# Patient Record
Sex: Female | Born: 1970
Health system: Southern US, Community
[De-identification: ages and names within clinical notes are randomized; demographics above are authoritative.]

## PROBLEM LIST (undated history)

## (undated) DIAGNOSIS — D219 Benign neoplasm of connective and other soft tissue, unspecified: Secondary | ICD-10-CM

## (undated) DIAGNOSIS — J189 Pneumonia, unspecified organism: Secondary | ICD-10-CM

## (undated) DIAGNOSIS — G709 Myoneural disorder, unspecified: Secondary | ICD-10-CM

## (undated) DIAGNOSIS — I739 Peripheral vascular disease, unspecified: Secondary | ICD-10-CM

## (undated) DIAGNOSIS — R42 Dizziness and giddiness: Secondary | ICD-10-CM

## (undated) DIAGNOSIS — D649 Anemia, unspecified: Secondary | ICD-10-CM

## (undated) DIAGNOSIS — T2230XA Burn of third degree of shoulder and upper limb, except wrist and hand, unspecified site, initial encounter: Secondary | ICD-10-CM

## (undated) DIAGNOSIS — D759 Disease of blood and blood-forming organs, unspecified: Secondary | ICD-10-CM

## (undated) DIAGNOSIS — K219 Gastro-esophageal reflux disease without esophagitis: Secondary | ICD-10-CM

## (undated) DIAGNOSIS — M1990 Unspecified osteoarthritis, unspecified site: Secondary | ICD-10-CM

## (undated) DIAGNOSIS — I10 Essential (primary) hypertension: Secondary | ICD-10-CM

## (undated) HISTORY — DX: Myoneural disorder, unspecified: G70.9

## (undated) HISTORY — PX: MYRINGOTOMY WITH TUBE PLACEMENT: SHX5663

## (undated) HISTORY — PX: WISDOM TOOTH EXTRACTION: SHX21

## (undated) HISTORY — DX: Burn of third degree of shoulder and upper limb, except wrist and hand, unspecified site, initial encounter: T22.30XA

## (undated) HISTORY — PX: FOOT SURGERY: SHX648

## (undated) HISTORY — PX: SKIN GRAFT: SHX250

## (undated) HISTORY — DX: Gastro-esophageal reflux disease without esophagitis: K21.9

## (undated) HISTORY — PX: BREAST EXCISIONAL BIOPSY: SUR124

---

## 2011-01-12 ENCOUNTER — Other Ambulatory Visit (HOSPITAL_COMMUNITY)
Admission: RE | Admit: 2011-01-12 | Discharge: 2011-01-12 | Disposition: A | Discharge status: Home / Self Care | Payer: Self-pay | Source: Ambulatory Visit | Attending: Family Medicine | Admitting: Family Medicine

## 2011-01-12 DIAGNOSIS — Z124 Encounter for screening for malignant neoplasm of cervix: Secondary | ICD-10-CM | POA: Insufficient documentation

## 2011-01-12 DIAGNOSIS — R87810 Cervical high risk human papillomavirus (HPV) DNA test positive: Secondary | ICD-10-CM | POA: Insufficient documentation

## 2011-12-08 ENCOUNTER — Other Ambulatory Visit (HOSPITAL_COMMUNITY)
Admission: RE | Admit: 2011-12-08 | Discharge: 2011-12-08 | Disposition: A | Discharge status: Home / Self Care | Payer: Medicaid Other | Source: Ambulatory Visit | Attending: Family Medicine | Admitting: Family Medicine

## 2011-12-08 ENCOUNTER — Other Ambulatory Visit (HOSPITAL_COMMUNITY): Payer: Self-pay | Admitting: Family Medicine

## 2011-12-08 DIAGNOSIS — R8789 Other abnormal findings in specimens from female genital organs: Secondary | ICD-10-CM | POA: Insufficient documentation

## 2011-12-08 DIAGNOSIS — D219 Benign neoplasm of connective and other soft tissue, unspecified: Secondary | ICD-10-CM

## 2011-12-11 ENCOUNTER — Ambulatory Visit (HOSPITAL_COMMUNITY)
Admission: RE | Admit: 2011-12-11 | Discharge: 2011-12-11 | Disposition: A | Discharge status: Home / Self Care | Payer: Medicaid Other | Source: Ambulatory Visit | Attending: Family Medicine | Admitting: Family Medicine

## 2011-12-11 DIAGNOSIS — D259 Leiomyoma of uterus, unspecified: Secondary | ICD-10-CM | POA: Insufficient documentation

## 2011-12-11 DIAGNOSIS — N949 Unspecified condition associated with female genital organs and menstrual cycle: Secondary | ICD-10-CM | POA: Insufficient documentation

## 2011-12-11 DIAGNOSIS — D219 Benign neoplasm of connective and other soft tissue, unspecified: Secondary | ICD-10-CM

## 2012-06-08 ENCOUNTER — Other Ambulatory Visit (HOSPITAL_COMMUNITY)
Admission: RE | Admit: 2012-06-08 | Discharge: 2012-06-08 | Disposition: A | Discharge status: Home / Self Care | Payer: Medicaid Other | Source: Ambulatory Visit | Attending: Family Medicine | Admitting: Family Medicine

## 2012-06-08 ENCOUNTER — Other Ambulatory Visit: Payer: Self-pay | Admitting: Family Medicine

## 2012-06-08 DIAGNOSIS — R87810 Cervical high risk human papillomavirus (HPV) DNA test positive: Secondary | ICD-10-CM | POA: Insufficient documentation

## 2012-06-08 DIAGNOSIS — Z113 Encounter for screening for infections with a predominantly sexual mode of transmission: Secondary | ICD-10-CM | POA: Insufficient documentation

## 2012-06-08 DIAGNOSIS — Z1151 Encounter for screening for human papillomavirus (HPV): Secondary | ICD-10-CM | POA: Insufficient documentation

## 2012-06-08 DIAGNOSIS — R8789 Other abnormal findings in specimens from female genital organs: Secondary | ICD-10-CM | POA: Insufficient documentation

## 2013-01-17 ENCOUNTER — Encounter (HOSPITAL_BASED_OUTPATIENT_CLINIC_OR_DEPARTMENT_OTHER): Payer: Self-pay | Admitting: Emergency Medicine

## 2013-01-17 ENCOUNTER — Emergency Department (HOSPITAL_BASED_OUTPATIENT_CLINIC_OR_DEPARTMENT_OTHER)
Admission: EM | Admit: 2013-01-17 | Discharge: 2013-01-17 | Disposition: A | Discharge status: Home / Self Care | Payer: Self-pay | Attending: Emergency Medicine | Admitting: Emergency Medicine

## 2013-01-17 DIAGNOSIS — R350 Frequency of micturition: Secondary | ICD-10-CM | POA: Insufficient documentation

## 2013-01-17 DIAGNOSIS — Z8742 Personal history of other diseases of the female genital tract: Secondary | ICD-10-CM | POA: Insufficient documentation

## 2013-01-17 DIAGNOSIS — Z3202 Encounter for pregnancy test, result negative: Secondary | ICD-10-CM | POA: Insufficient documentation

## 2013-01-17 DIAGNOSIS — N390 Urinary tract infection, site not specified: Secondary | ICD-10-CM | POA: Insufficient documentation

## 2013-01-17 HISTORY — DX: Benign neoplasm of connective and other soft tissue, unspecified: D21.9

## 2013-01-17 LAB — URINALYSIS, ROUTINE W REFLEX MICROSCOPIC
Glucose, UA: NEGATIVE mg/dL
pH: 7.5 (ref 5.0–8.0)

## 2013-01-17 MED ORDER — PHENAZOPYRIDINE HCL 100 MG PO TABS
95.0000 mg | ORAL_TABLET | Freq: Once | ORAL | Status: AC
  Administered 2013-01-17: 100 mg via ORAL
  Filled 2013-01-17: qty 1

## 2013-01-17 MED ORDER — NITROFURANTOIN MONOHYD MACRO 100 MG PO CAPS
100.0000 mg | ORAL_CAPSULE | Freq: Once | ORAL | Status: AC
  Administered 2013-01-17: 100 mg via ORAL
  Filled 2013-01-17: qty 1

## 2013-01-17 MED ORDER — NITROFURANTOIN MONOHYD MACRO 100 MG PO CAPS: 100.0000 mg | ORAL_CAPSULE | Freq: Two times a day (BID) | ORAL | Status: DC

## 2013-01-17 MED ORDER — HYDROCODONE-ACETAMINOPHEN 5-325 MG PO TABS: 1.0000 | ORAL_TABLET | Freq: Four times a day (QID) | ORAL | Status: DC | PRN

## 2013-01-17 MED ORDER — PHENAZOPYRIDINE HCL 200 MG PO TABS: 200.0000 mg | ORAL_TABLET | Freq: Three times a day (TID) | ORAL | Status: DC

## 2013-01-17 NOTE — Discharge Instructions (Signed)
Urinary Tract Infection  Urinary tract infections (UTIs) can develop anywhere along your urinary tract. Your urinary tract is your body's drainage system for removing wastes and extra water. Your urinary tract includes two kidneys, two ureters, a bladder, and a urethra. Your kidneys are a pair of bean-shaped organs. Each kidney is about the size of your fist. They are located below your ribs, one on each side of your spine.  CAUSES  Infections are caused by microbes, which are microscopic organisms, including fungi, viruses, and bacteria. These organisms are so small that they can only be seen through a microscope. Bacteria are the microbes that most commonly cause UTIs.  SYMPTOMS   Symptoms of UTIs may vary by age and gender of the patient and by the location of the infection. Symptoms in young women typically include a frequent and intense urge to urinate and a painful, burning feeling in the bladder or urethra during urination. Older women and men are more likely to be tired, shaky, and weak and have muscle aches and abdominal pain. A fever may mean the infection is in your kidneys. Other symptoms of a kidney infection include pain in your back or sides below the ribs, nausea, and vomiting.  DIAGNOSIS  To diagnose a UTI, your caregiver will ask you about your symptoms. Your caregiver also will ask to provide a urine sample. The urine sample will be tested for bacteria and white blood cells. White blood cells are made by your body to help fight infection.  TREATMENT   Typically, UTIs can be treated with medication. Because most UTIs are caused by a bacterial infection, they usually can be treated with the use of antibiotics. The choice of antibiotic and length of treatment depend on your symptoms and the type of bacteria causing your infection.  HOME CARE INSTRUCTIONS   If you were prescribed antibiotics, take them exactly as your caregiver instructs you. Finish the medication even if you feel better after you  have only taken some of the medication.   Drink enough water and fluids to keep your urine clear or pale yellow.   Avoid caffeine, tea, and carbonated beverages. They tend to irritate your bladder.   Empty your bladder often. Avoid holding urine for long periods of time.   Empty your bladder before and after sexual intercourse.   After a bowel movement, women should cleanse from front to back. Use each tissue only once.  SEEK MEDICAL CARE IF:    You have back pain.   You develop a fever.   Your symptoms do not begin to resolve within 3 days.  SEEK IMMEDIATE MEDICAL CARE IF:    You have severe back pain or lower abdominal pain.   You develop chills.   You have nausea or vomiting.   You have continued burning or discomfort with urination.  MAKE SURE YOU:    Understand these instructions.   Will watch your condition.   Will get help right away if you are not doing well or get worse.  Document Released: 03/18/2005 Document Revised: 12/08/2011 Document Reviewed: 07/17/2011  [employer] Patient Information 2014 [employer].

## 2013-01-17 NOTE — ED Provider Notes (Signed)
CSN: [id number]     Arrival date & time 01/17/13  2052 History     First MD Initiated Contact with Patient 01/17/13 2302     Chief Complaint  Patient presents with  . Abdominal Pain   (Consider location/radiation/quality/duration/timing/severity/associated sxs/prior Treatment) Patient is a 42 y.o. female presenting with abdominal pain. The history is provided by the patient.  Abdominal Pain This is a new problem. The current episode started 3 to 5 hours ago. The problem occurs constantly. The problem has not changed since onset.Associated symptoms include abdominal pain. Pertinent negatives include no chest pain, no headaches and no shortness of breath. Nothing aggravates the symptoms. Nothing relieves the symptoms. She has tried nothing for the symptoms. The treatment provided no relief.  Urinary frequency.  No n/v/d.  No fevers  Past Medical History  Diagnosis Date  . Fibroid tumor    Past Surgical History  Procedure Laterality Date  . Wisdom tooth extraction    . Breast lumpectomy    . Foot surgery    . Skin graft    . Myringotomy with tube placement     History reviewed. No pertinent family history. History  Substance Use Topics  . Smoking status: Never Smoker   . Smokeless tobacco: Never Used  . Alcohol Use: Yes     Comment: ocassionally   OB History   Grav Para Term Preterm Abortions TAB SAB Ect Mult Living                 Review of Systems  Respiratory: Negative for shortness of breath.   Cardiovascular: Negative for chest pain.  Gastrointestinal: Positive for abdominal pain.  Genitourinary: Positive for frequency.  Neurological: Negative for headaches.  All other systems reviewed and are negative.    Allergies  Dilaudid  Home Medications   Current Outpatient Rx  Name  Route  Sig  Dispense  Refill  . ibuprofen (ADVIL,MOTRIN) 200 MG tablet   Oral   Take 200 mg by mouth every 6 (six) hours as needed for pain.          BP 128/73  Pulse 80  Resp  18  Ht 5\' 3"  (1.6 m)  Wt 141 lb (63.957 kg)  BMI 24.98 kg/m2  SpO2 100%  LMP 01/03/2013 Physical Exam  Constitutional: She is oriented to person, place, and time. She appears well-developed and well-nourished. No distress.  HENT:  Head: Normocephalic and atraumatic.  Mouth/Throat: Oropharynx is clear and moist.  Eyes: Conjunctivae are normal. Pupils are equal, round, and reactive to light.  Neck: Normal range of motion. Neck supple.  Cardiovascular: Normal rate, regular rhythm, normal heart sounds and intact distal pulses.   Pulmonary/Chest: Effort normal and breath sounds normal. She has no wheezes. She has no rales.  Abdominal: Soft. Bowel sounds are normal. She exhibits no distension and no mass. There is no tenderness. There is no rebound and no guarding.  Musculoskeletal: Normal range of motion.  Neurological: She is alert and oriented to person, place, and time.  Skin: Skin is warm and dry.  Psychiatric: She has a normal mood and affect.    ED Course   Procedures (including critical care time)  Labs Reviewed  URINALYSIS, ROUTINE W REFLEX MICROSCOPIC - Abnormal; Notable for the following:    Leukocytes, UA SMALL (*)    All other components within normal limits  PREGNANCY, URINE  URINE MICROSCOPIC-ADD ON   No results found. No diagnosis found.  MDM  Exam and vitals benign no indication  for imaging at this time.  If symptoms persist return.  Follow up with your family doctor in 2 days for recheck  Kashena Novitski K Bryttney Netzer-Rasch, MD 01/17/13 2309

## 2013-01-17 NOTE — ED Notes (Signed)
Pt reports lower back pain that radiates to front of abdomen

## 2013-01-18 ENCOUNTER — Telehealth (HOSPITAL_COMMUNITY): Payer: Self-pay | Admitting: Emergency Medicine

## 2013-01-18 NOTE — ED Notes (Signed)
Call from pharmacy Rx for Macrobid too expensive.  Chart reviewed by Everlean Patterson PA "Keflex 500 mg po QID x 10 days #40"  New Rx called to pharmacy.

## 2013-05-09 ENCOUNTER — Emergency Department (HOSPITAL_BASED_OUTPATIENT_CLINIC_OR_DEPARTMENT_OTHER)
Admission: EM | Admit: 2013-05-09 | Discharge: 2013-05-09 | Disposition: A | Discharge status: Home / Self Care | Payer: Medicaid Other | Attending: Emergency Medicine | Admitting: Emergency Medicine

## 2013-05-09 ENCOUNTER — Encounter (HOSPITAL_BASED_OUTPATIENT_CLINIC_OR_DEPARTMENT_OTHER): Payer: Self-pay | Admitting: Emergency Medicine

## 2013-05-09 DIAGNOSIS — L0201 Cutaneous abscess of face: Secondary | ICD-10-CM | POA: Insufficient documentation

## 2013-05-09 DIAGNOSIS — L03211 Cellulitis of face: Secondary | ICD-10-CM | POA: Insufficient documentation

## 2013-05-09 DIAGNOSIS — Z79899 Other long term (current) drug therapy: Secondary | ICD-10-CM | POA: Insufficient documentation

## 2013-05-09 DIAGNOSIS — Z8742 Personal history of other diseases of the female genital tract: Secondary | ICD-10-CM | POA: Insufficient documentation

## 2013-05-09 DIAGNOSIS — J340 Abscess, furuncle and carbuncle of nose: Secondary | ICD-10-CM

## 2013-05-09 MED ORDER — DOXYCYCLINE HYCLATE 100 MG PO CAPS: 100.0000 mg | ORAL_CAPSULE | Freq: Two times a day (BID) | ORAL | Status: DC

## 2013-05-09 MED ORDER — LIDOCAINE HCL (PF) 1 % IJ SOLN
INTRAMUSCULAR | Status: AC
  Filled 2013-05-09: qty 5

## 2013-05-09 NOTE — ED Notes (Signed)
Patient states she has had sinus congestion for one week.  Several days ago she noticed that she has swelling in the left side of her nose and has noticed a swelling on the inside of her nose.  Symptoms are associated with productive cough with clear to yellow to bloody secretions.

## 2013-05-09 NOTE — ED Provider Notes (Signed)
CSN: [id number]     Arrival date & time 05/09/13  8295 History   First MD Initiated Contact with Patient 05/09/13 1028     Chief Complaint  Patient presents with  . URI   (Consider location/radiation/quality/duration/timing/severity/associated sxs/prior Treatment) Patient is a 42 y.o. female presenting with URI.  URI  Pt with no significant past medical history reports several days of sinus congestion and nasal drainage. For 2 days she has had increasing pain and swelling in her L nare with small amount of bleeding. No fever, no cough or sore throat. Some post-nasal drip.  Past Medical History  Diagnosis Date  . Fibroid tumor    Past Surgical History  Procedure Laterality Date  . Wisdom tooth extraction    . Breast lumpectomy    . Foot surgery    . Skin graft    . Myringotomy with tube placement     No family history on file. History  Substance Use Topics  . Smoking status: Never Smoker   . Smokeless tobacco: Never Used  . Alcohol Use: Yes     Comment: ocassionally   OB History   Grav Para Term Preterm Abortions TAB SAB Ect Mult Living                 Review of Systems All other systems reviewed and are negative except as noted in HPI.    Allergies  Dilaudid  Home Medications   Current Outpatient Rx  Name  Route  Sig  Dispense  Refill  . HYDROcodone-acetaminophen (NORCO) 5-325 MG per tablet   Oral   Take 1 tablet by mouth every 6 (six) hours as needed for pain.   10 tablet   0   . ibuprofen (ADVIL,MOTRIN) 200 MG tablet   Oral   Take 200 mg by mouth every 6 (six) hours as needed for pain.         . nitrofurantoin, macrocrystal-monohydrate, (MACROBID) 100 MG capsule   Oral   Take 1 capsule (100 mg total) by mouth 2 (two) times daily. X 7 days   14 capsule   0   . phenazopyridine (PYRIDIUM) 200 MG tablet   Oral   Take 1 tablet (200 mg total) by mouth 3 (three) times daily.   6 tablet   0    BP 111/66  Pulse 77  Temp(Src) 98.5 F (36.9 C)  (Oral)  Resp 16  Ht 5\' 3"  (1.6 m)  Wt 135 lb 6 oz (61.406 kg)  BMI 23.99 kg/m2  SpO2 99%  LMP 05/03/2013 Physical Exam  Nursing note and vitals reviewed. Constitutional: She is oriented to person, place, and time. She appears well-developed and well-nourished.  HENT:  Head: Normocephalic and atraumatic.  There is mucosal edema on the R nare and a 1cm x 1cm area of swelling and fluctuance inside the L nare, originating laterally and draining small amount of pus  Eyes: EOM are normal. Pupils are equal, round, and reactive to light.  Neck: Normal range of motion. Neck supple.  Cardiovascular: Normal rate, normal heart sounds and intact distal pulses.   Pulmonary/Chest: Effort normal and breath sounds normal.  Abdominal: Bowel sounds are normal. She exhibits no distension. There is no tenderness.  Musculoskeletal: Normal range of motion. She exhibits no edema and no tenderness.  Neurological: She is alert and oriented to person, place, and time. She has normal strength. No cranial nerve deficit or sensory deficit.  Skin: Skin is warm and dry. No rash noted.  Psychiatric:  She has a normal mood and affect.    ED Course  Procedures (including critical care time)  INCISION AND DRAINAGE Performed by: Pollyann Savoy. Consent: Verbal consent obtained. Risks and benefits: risks, benefits and alternatives were discussed Time out performed prior to procedure Type: abscess Body area: intranasal Anesthesia: local infiltration Incision was made with a scalpel. Local anesthetic: lidocaine 1% no epinephrine Anesthetic total: 1 ml Complexity: simple Drainage: purulent Drainage amount: small Packing material: none Patient tolerance: Patient tolerated the procedure well with no immediate complications.     Labs Review Labs Reviewed - No data to display Imaging Review No results found.  EKG Interpretation   None       MDM   1. Abscess of nose     Pt with small intranasal  abscess, suspect CA-MRSA. Will give topical Abx ointment and oral doxy with ENT followup for resolution.     Charles B. Bernette Mayers, MD 05/09/13 1256

## 2013-05-09 NOTE — ED Notes (Signed)
MD at bedside for I and D

## 2014-04-05 ENCOUNTER — Emergency Department (HOSPITAL_BASED_OUTPATIENT_CLINIC_OR_DEPARTMENT_OTHER): Payer: Medicaid Other

## 2014-04-05 ENCOUNTER — Encounter (HOSPITAL_BASED_OUTPATIENT_CLINIC_OR_DEPARTMENT_OTHER): Payer: Self-pay | Admitting: Emergency Medicine

## 2014-04-05 ENCOUNTER — Emergency Department (HOSPITAL_BASED_OUTPATIENT_CLINIC_OR_DEPARTMENT_OTHER)
Admission: EM | Admit: 2014-04-05 | Discharge: 2014-04-06 | Disposition: A | Discharge status: Home / Self Care | Payer: Medicaid Other | Attending: Emergency Medicine | Admitting: Emergency Medicine

## 2014-04-05 DIAGNOSIS — Z87448 Personal history of other diseases of urinary system: Secondary | ICD-10-CM | POA: Insufficient documentation

## 2014-04-05 DIAGNOSIS — R42 Dizziness and giddiness: Secondary | ICD-10-CM | POA: Insufficient documentation

## 2014-04-05 DIAGNOSIS — Z792 Long term (current) use of antibiotics: Secondary | ICD-10-CM | POA: Insufficient documentation

## 2014-04-05 DIAGNOSIS — Z79899 Other long term (current) drug therapy: Secondary | ICD-10-CM | POA: Insufficient documentation

## 2014-04-05 DIAGNOSIS — Z791 Long term (current) use of non-steroidal anti-inflammatories (NSAID): Secondary | ICD-10-CM | POA: Insufficient documentation

## 2014-04-05 LAB — CBC WITH DIFFERENTIAL/PLATELET
BASOS ABS: 0 10*3/uL (ref 0.0–0.1)
Basophils Relative: 0 % (ref 0–1)
EOS PCT: 2 % (ref 0–5)
Eosinophils Absolute: 0.1 10*3/uL (ref 0.0–0.7)
HCT: 31.4 % — ABNORMAL LOW (ref 36.0–46.0)
Hemoglobin: 10.3 g/dL — ABNORMAL LOW (ref 12.0–15.0)
LYMPHS PCT: 36 % (ref 12–46)
Lymphs Abs: 2 10*3/uL (ref 0.7–4.0)
MCH: 28.5 pg (ref 26.0–34.0)
MCHC: 32.8 g/dL (ref 30.0–36.0)
MCV: 86.7 fL (ref 78.0–100.0)
Monocytes Absolute: 0.5 10*3/uL (ref 0.1–1.0)
Monocytes Relative: 9 % (ref 3–12)
NEUTROS ABS: 2.8 10*3/uL (ref 1.7–7.7)
Neutrophils Relative %: 53 % (ref 43–77)
PLATELETS: 225 10*3/uL (ref 150–400)
RBC: 3.62 MIL/uL — ABNORMAL LOW (ref 3.87–5.11)
RDW: 13.7 % (ref 11.5–15.5)
WBC: 5.4 10*3/uL (ref 4.0–10.5)

## 2014-04-05 MED ORDER — MECLIZINE HCL 25 MG PO TABS
25.0000 mg | ORAL_TABLET | Freq: Once | ORAL | Status: AC
  Administered 2014-04-05: 25 mg via ORAL
  Filled 2014-04-05: qty 1

## 2014-04-05 NOTE — ED Provider Notes (Signed)
CSN: [id number]     Arrival date & time 04/05/14  2120 History  This chart was scribed for Gail Speak, MD by Delphia Grates, ED Scribe. This patient was seen in room MH11/MH11 and the patient's care was started at 11:16 PM.     Chief Complaint  Patient presents with  . Dizziness    The history is provided by the patient. No language interpreter was used.    HPI Comments: Gail Moreno is a 43 y.o. female who presents to the Emergency Department complaining of gradually worsening dizziness onset today. Patient describes the dizziness as a room spinning sensation persists even while eyes are closed. Patient reports history of same over the past 4 years and was evaluated, however, she reports she was asymptomatic during her evaluation. There is associated bilateral hearing loss and occasional numbness in the right hand. Patient reports she the dizziness is still present while her eyes are closed. She denies any aggravating factors, and states she currently not experiencing any dizziness at present.  She denies HA, blurred vision, tinnitus, or neck pain.   Past Medical History  Diagnosis Date  . Fibroid tumor    Past Surgical History  Procedure Laterality Date  . Wisdom tooth extraction    . Breast lumpectomy    . Foot surgery    . Skin graft    . Myringotomy with tube placement     No family history on file. History  Substance Use Topics  . Smoking status: Never Smoker   . Smokeless tobacco: Never Used  . Alcohol Use: Yes     Comment: ocassionally   OB History   Grav Para Term Preterm Abortions TAB SAB Ect Mult Living                 Review of Systems A complete 10 system review of systems was obtained and all systems are negative except as noted in the HPI and PMH.     Allergies  Dilaudid  Home Medications   Prior to Admission medications   Medication Sig Start Date End Date Taking? Authorizing Provider  doxycycline (VIBRAMYCIN) 100 MG capsule Take 1 capsule  (100 mg total) by mouth 2 (two) times daily. 05/09/13   Charles B. Karle Starch, MD  HYDROcodone-acetaminophen (NORCO) 5-325 MG per tablet Take 1 tablet by mouth every 6 (six) hours as needed for pain. 01/17/13   April K Palumbo-Rasch, MD  ibuprofen (ADVIL,MOTRIN) 200 MG tablet Take 200 mg by mouth every 6 (six) hours as needed for pain.    Historical Provider, MD  nitrofurantoin, macrocrystal-monohydrate, (MACROBID) 100 MG capsule Take 1 capsule (100 mg total) by mouth 2 (two) times daily. X 7 days 01/17/13   April K Palumbo-Rasch, MD  phenazopyridine (PYRIDIUM) 200 MG tablet Take 1 tablet (200 mg total) by mouth 3 (three) times daily. 01/17/13   April K Palumbo-Rasch, MD   BP 107/71  Pulse 70  Temp(Src) 98.4 F (36.9 C) (Oral)  Resp 16  Ht 5\' 3"  (1.6 m)  Wt 145 lb (65.772 kg)  BMI 25.69 kg/m2  SpO2 100%  LMP 03/17/2014  Physical Exam  Nursing note and vitals reviewed. Constitutional: She is oriented to person, place, and time. She appears well-developed and well-nourished. No distress.  HENT:  Head: Normocephalic and atraumatic.  Mouth/Throat: Oropharynx is clear and moist.  TMs are clear bilaterally.  Eyes: Conjunctivae and EOM are normal. Pupils are equal, round, and reactive to light.  Neck: Neck supple. No tracheal deviation present.  Cardiovascular:  Normal rate, regular rhythm and normal heart sounds.   No murmur heard. Pulmonary/Chest: Effort normal and breath sounds normal. No respiratory distress.  Musculoskeletal: Normal range of motion.  Neurological: She is alert and oriented to person, place, and time. No cranial nerve deficit. She exhibits normal muscle tone. Coordination normal.  Skin: Skin is warm and dry.  Psychiatric: She has a normal mood and affect. Her behavior is normal.    ED Course  Procedures (including critical care time)  DIAGNOSTIC STUDIES: Oxygen Saturation is 100% on room air, normal by my interpretation.    COORDINATION OF CARE: At 2322 Discussed  treatment plan with patient which includes Antivert, labs, and CT scan. Patient agrees.   Labs Review Labs Reviewed  CBC WITH DIFFERENTIAL - Abnormal; Notable for the following:    RBC 3.62 (*)    Hemoglobin 10.3 (*)    HCT 31.4 (*)    All other components within normal limits  BASIC METABOLIC PANEL    Imaging Review Ct Head Wo Contrast  04/05/2014   CLINICAL DATA:  Dizziness and right arm/hand numbness. Initial encounter.  EXAM: CT HEAD WITHOUT CONTRAST  TECHNIQUE: Contiguous axial images were obtained from the base of the skull through the vertex without intravenous contrast.  COMPARISON:  None.  FINDINGS: Skull and Sinuses:Negative for fracture or destructive process. The mastoids, middle ears, and imaged paranasal sinuses are clear.  Orbits: No acute abnormality.  Brain: No evidence of acute abnormality, such as acute infarction, hemorrhage, hydrocephalus, or mass lesion/mass effect. Apparent low-attenuation along the left occipital cortex is most consistent with artifact related to concavity of the calvarium.  IMPRESSION: Negative head CT.   Electronically Signed   By: Jorje Guild M.D.   On: 04/05/2014 23:47     EKG Interpretation None      MDM   Final diagnoses:  None    Patient presents with dizziness that sounds like peripheral vertigo. Her physical examination is unremarkable and neuro exam is nonfocal. Workup reveals a mild anemia with a hemoglobin of 10.3, however no other abnormalities in the electrolytes and head CT is normal. She will be treated with meclizine and advised to followup with her primary Dr.  I personally performed the services described in this documentation, which was scribed in my presence. The recorded information has been reviewed and is accurate.      Gail Speak, MD 04/06/14 [phone]

## 2014-04-05 NOTE — ED Notes (Signed)
C/o dizziness and hearing loss since 2011-states she came to ED tonight due to increase dizziness-denies injury-steady gait into triage

## 2014-04-06 LAB — BASIC METABOLIC PANEL
ANION GAP: 11 (ref 5–15)
BUN: 16 mg/dL (ref 6–23)
CALCIUM: 9.7 mg/dL (ref 8.4–10.5)
CO2: 27 meq/L (ref 19–32)
Chloride: 103 mEq/L (ref 96–112)
Creatinine, Ser: 0.8 mg/dL (ref 0.50–1.10)
GFR calc Af Amer: >90 mL/min (ref >90)
GFR calc non Af Amer: 90 mL/min — ABNORMAL LOW (ref >90)
Glucose, Bld: 99 mg/dL (ref 70–99)
Potassium: 4.1 mEq/L (ref 3.7–5.3)
SODIUM: 141 meq/L (ref 137–147)

## 2014-04-06 MED ORDER — MECLIZINE HCL 25 MG PO TABS: 25.0000 mg | ORAL_TABLET | Freq: Three times a day (TID) | ORAL | Status: DC | PRN

## 2014-04-06 NOTE — ED Notes (Signed)
MD at bedside discussing test results and dispo plan of care. 

## 2014-04-06 NOTE — Discharge Instructions (Signed)
Meclizine as prescribed as needed for dizziness.  Followup with your primary Dr. for a recheck in the next 1-2 weeks.   Benign Positional Vertigo Vertigo means you feel like you or your surroundings are moving when they are not. Benign positional vertigo is the most common form of vertigo. Benign means that the cause of your condition is not serious. Benign positional vertigo is more common in older adults. CAUSES  Benign positional vertigo is the result of an upset in the labyrinth system. This is an area in the middle ear that helps control your balance. This may be caused by a viral infection, head injury, or repetitive motion. However, often no specific cause is found. SYMPTOMS  Symptoms of benign positional vertigo occur when you move your head or eyes in different directions. Some of the symptoms may include:  Loss of balance and falls.  Vomiting.  Blurred vision.  Dizziness.  Nausea.  Involuntary eye movements (nystagmus). DIAGNOSIS  Benign positional vertigo is usually diagnosed by physical exam. If the specific cause of your benign positional vertigo is unknown, your caregiver may perform imaging tests, such as magnetic resonance imaging (MRI) or computed tomography (CT). TREATMENT  Your caregiver may recommend movements or procedures to correct the benign positional vertigo. Medicines such as meclizine, benzodiazepines, and medicines for nausea may be used to treat your symptoms. In rare cases, if your symptoms are caused by certain conditions that affect the inner ear, you may need surgery. HOME CARE INSTRUCTIONS   Follow your caregiver's instructions.  Move slowly. Do not make sudden body or head movements.  Avoid driving.  Avoid operating heavy machinery.  Avoid performing any tasks that would be dangerous to you or others during a vertigo episode.  Drink enough fluids to keep your urine clear or pale yellow. SEEK IMMEDIATE MEDICAL CARE IF:   You develop  problems with walking, weakness, numbness, or using your arms, hands, or legs.  You have difficulty speaking.  You develop severe headaches.  Your nausea or vomiting continues or gets worse.  You develop visual changes.  Your family or friends notice any behavioral changes.  Your condition gets worse.  You have a fever.  You develop a stiff neck or sensitivity to light. MAKE SURE YOU:   Understand these instructions.  Will watch your condition.  Will get help right away if you are not doing well or get worse. Document Released: 03/16/2006 Document Revised: 08/31/2011 Document Reviewed: 02/26/2011 [facility] Patient Information 2015 [place], [place]. This information is not intended to replace advice given to you by your health care provider. Make sure you discuss any questions you have with your health care provider.

## 2014-04-20 ENCOUNTER — Emergency Department (HOSPITAL_BASED_OUTPATIENT_CLINIC_OR_DEPARTMENT_OTHER)
Admission: EM | Admit: 2014-04-20 | Discharge: 2014-04-20 | Disposition: A | Discharge status: Home / Self Care | Payer: Medicaid Other | Attending: Emergency Medicine | Admitting: Emergency Medicine

## 2014-04-20 ENCOUNTER — Encounter (HOSPITAL_BASED_OUTPATIENT_CLINIC_OR_DEPARTMENT_OTHER): Payer: Self-pay | Admitting: Emergency Medicine

## 2014-04-20 DIAGNOSIS — Z86018 Personal history of other benign neoplasm: Secondary | ICD-10-CM | POA: Insufficient documentation

## 2014-04-20 DIAGNOSIS — Z792 Long term (current) use of antibiotics: Secondary | ICD-10-CM | POA: Insufficient documentation

## 2014-04-20 DIAGNOSIS — M545 Low back pain, unspecified: Secondary | ICD-10-CM

## 2014-04-20 DIAGNOSIS — Z79899 Other long term (current) drug therapy: Secondary | ICD-10-CM | POA: Insufficient documentation

## 2014-04-20 DIAGNOSIS — R1032 Left lower quadrant pain: Secondary | ICD-10-CM | POA: Insufficient documentation

## 2014-04-20 DIAGNOSIS — Z3202 Encounter for pregnancy test, result negative: Secondary | ICD-10-CM | POA: Insufficient documentation

## 2014-04-20 LAB — URINALYSIS, ROUTINE W REFLEX MICROSCOPIC
Bilirubin Urine: NEGATIVE
Glucose, UA: NEGATIVE mg/dL
KETONES UR: NEGATIVE mg/dL
LEUKOCYTES UA: NEGATIVE
NITRITE: NEGATIVE
PH: 7.5 (ref 5.0–8.0)
PROTEIN: NEGATIVE mg/dL
Specific Gravity, Urine: 1.019 (ref 1.005–1.030)
UROBILINOGEN UA: 1 mg/dL (ref 0.0–1.0)

## 2014-04-20 LAB — URINE MICROSCOPIC-ADD ON

## 2014-04-20 LAB — PREGNANCY, URINE: Preg Test, Ur: NEGATIVE

## 2014-04-20 MED ORDER — IBUPROFEN 800 MG PO TABS: 800.0000 mg | ORAL_TABLET | Freq: Three times a day (TID) | ORAL | Status: DC

## 2014-04-20 MED ORDER — HYDROCODONE-ACETAMINOPHEN 5-325 MG PO TABS
1.0000 | ORAL_TABLET | Freq: Once | ORAL | Status: AC
  Administered 2014-04-20: 1 via ORAL
  Filled 2014-04-20: qty 1

## 2014-04-20 MED ORDER — HYDROCODONE-ACETAMINOPHEN 5-325 MG PO TABS: 1.0000 | ORAL_TABLET | ORAL | Status: DC | PRN

## 2014-04-20 MED ORDER — CYCLOBENZAPRINE HCL 10 MG PO TABS: 10.0000 mg | ORAL_TABLET | Freq: Two times a day (BID) | ORAL | Status: DC | PRN

## 2014-04-20 NOTE — ED Notes (Signed)
Pt reports back pain since tuesday

## 2014-04-20 NOTE — ED Notes (Signed)
Pt reports lower back pain that began on Tuesday - denies any known mechanism of injury - pain worse w/ movement, pt also admits to starting menstrual cycle on Wednesday. Pt took tylenol and azo assuming it may be a UTI - w/o relief.

## 2014-04-20 NOTE — ED Provider Notes (Signed)
CSN: [id number]     Arrival date & time 04/20/14  1842 History   First MD Initiated Contact with Patient 04/20/14 1851     Chief Complaint  Patient presents with  . Back Pain     (Consider location/radiation/quality/duration/timing/severity/associated sxs/prior Treatment) Patient is a 43 y.o. female presenting with back pain. The history is provided by the patient. No language interpreter was used.  Back Pain Location:  Lumbar spine Quality:  Stabbing and aching Radiates to:  L thigh Pain severity:  Moderate Duration:  4 days Timing:  Constant Associated symptoms: abdominal pain   Associated symptoms: no dysuria, no fever, no numbness and no weakness   Associated symptoms comment:  Lower back pain without known injury for the past 4 days that radiates into anterior left thigh. No swelling, weakness, numbness. No urinary/bowel incontinence. She states the pain intermittently affects her left lower abdomen. No urinary symptoms, vaginal discharge, abnormal vaginal bleeding. No nausea, vomiting or fever.    Past Medical History  Diagnosis Date  . Fibroid tumor    Past Surgical History  Procedure Laterality Date  . Wisdom tooth extraction    . Breast lumpectomy    . Foot surgery    . Skin graft    . Myringotomy with tube placement     History reviewed. No pertinent family history. History  Substance Use Topics  . Smoking status: Never Smoker   . Smokeless tobacco: Never Used  . Alcohol Use: Yes     Comment: ocassionally   OB History   Grav Para Term Preterm Abortions TAB SAB Ect Mult Living                 Review of Systems  Constitutional: Negative for fever and chills.  HENT: Negative.   Respiratory: Negative.   Cardiovascular: Negative.   Gastrointestinal: Positive for abdominal pain. Negative for nausea and vomiting.       See HPI.  Genitourinary: Negative.  Negative for dysuria, vaginal bleeding and vaginal discharge.  Musculoskeletal: Positive for back pain.        See HPI.  Skin: Negative.   Neurological: Negative.  Negative for weakness and numbness.      Allergies  Dilaudid  Home Medications   Prior to Admission medications   Medication Sig Start Date End Date Taking? Authorizing Provider  doxycycline (VIBRAMYCIN) 100 MG capsule Take 1 capsule (100 mg total) by mouth 2 (two) times daily. 05/09/13   Charles B. Karle Starch, MD  HYDROcodone-acetaminophen (NORCO) 5-325 MG per tablet Take 1 tablet by mouth every 6 (six) hours as needed for pain. 01/17/13   April K Palumbo-Rasch, MD  ibuprofen (ADVIL,MOTRIN) 200 MG tablet Take 200 mg by mouth every 6 (six) hours as needed for pain.    Historical Provider, MD  meclizine (ANTIVERT) 25 MG tablet Take 1 tablet (25 mg total) by mouth 3 (three) times daily as needed for dizziness. 04/06/14   [name], MD  nitrofurantoin, macrocrystal-monohydrate, (MACROBID) 100 MG capsule Take 1 capsule (100 mg total) by mouth 2 (two) times daily. X 7 days 01/17/13   April K Palumbo-Rasch, MD  phenazopyridine (PYRIDIUM) 200 MG tablet Take 1 tablet (200 mg total) by mouth 3 (three) times daily. 01/17/13   April K Palumbo-Rasch, MD   BP 113/67  Pulse 81  Temp(Src) 97.5 F (36.4 C) (Oral)  Resp 18  SpO2 100%  LMP 04/16/2014 Physical Exam  Constitutional: She is oriented to person, place, and time. She appears well-developed and well-nourished.  HENT:  Head: Normocephalic.  Neck: Normal range of motion. Neck supple.  Cardiovascular: Normal rate and regular rhythm.   Pulmonary/Chest: Effort normal and breath sounds normal.  Abdominal: Soft. Bowel sounds are normal. There is no tenderness. There is no rebound and no guarding.  Musculoskeletal: Normal range of motion.  Mild bilateral para-lumbar tenderness without swelling. FROM bilateral LE's without pain, weakness.   Neurological: She is alert and oriented to person, place, and time.  Straight leg raise negative (left). Reflexes equal bilateral LE's.   Skin: Skin  is warm and dry. No rash noted.  Psychiatric: She has a normal mood and affect.    ED Course  Procedures (including critical care time) Labs Review Labs Reviewed  URINALYSIS, ROUTINE W REFLEX MICROSCOPIC - Abnormal; Notable for the following:    Hgb urine dipstick SMALL (*)    All other components within normal limits  PREGNANCY, URINE  URINE MICROSCOPIC-ADD ON    Imaging Review No results found.   EKG Interpretation None      MDM   Final diagnoses:  None    1. Back pain  No neurologic deficits on exam. Pain is responsive to Norco, patient feels better in ED. UA negative, afebrile, normal VS. Suspect back pain with radiation causing LLQ abdominal and left thigh pain. Will treat symptomatically and encourage Ortho follow up for further management if symptoms persist.     Dewaine Oats, PA-C 04/20/14 2019  Tanna Furry, MD 04/27/14 [phone]

## 2014-04-20 NOTE — Discharge Instructions (Signed)
Back Pain, Adult °Low back pain is very common. About 1 in 5 people have back pain. The cause of low back pain is rarely dangerous. The pain often gets better over time. About half of people with a sudden onset of back pain feel better in just 2 weeks. About 8 in 10 people feel better by 6 weeks.  °CAUSES °Some common causes of back pain include: °· Strain of the muscles or ligaments supporting the spine. °· Wear and tear (degeneration) of the spinal discs. °· Arthritis. °· Direct injury to the back. °DIAGNOSIS °Most of the time, the direct cause of low back pain is not known. However, back pain can be treated effectively even when the exact cause of the pain is unknown. Answering your caregiver's questions about your overall health and symptoms is one of the most accurate ways to make sure the cause of your pain is not dangerous. If your caregiver needs more information, he or she may order lab work or imaging tests (X-rays or MRIs). However, even if imaging tests show changes in your back, this usually does not require surgery. °HOME CARE INSTRUCTIONS °For many people, back pain returns. Since low back pain is rarely dangerous, it is often a condition that people can learn to manage on their own.  °· Remain active. It is stressful on the back to sit or stand in one place. Do not sit, drive, or stand in one place for more than 30 minutes at a time. Take short walks on level surfaces as soon as pain allows. Try to increase the length of time you walk each day. °· Do not stay in bed. Resting more than 1 or 2 days can delay your recovery. °· Do not avoid exercise or work. Your body is made to move. It is not dangerous to be active, even though your back may hurt. Your back will likely heal faster if you return to being active before your pain is gone. °· Pay attention to your body when you  bend and lift. Many people have less discomfort when lifting if they bend their knees, keep the load close to their bodies, and  avoid twisting. Often, the most comfortable positions are those that put less stress on your recovering back. °· Find a comfortable position to sleep. Use a firm mattress and lie on your side with your knees slightly bent. If you lie on your back, put a pillow under your knees. °· Only take over-the-counter or prescription medicines as directed by your caregiver. Over-the-counter medicines to reduce pain and inflammation are often the most helpful. Your caregiver may prescribe muscle relaxant drugs. These medicines help dull your pain so you can more quickly return to your normal activities and healthy exercise. °· Put ice on the injured area. °¨ Put ice in a plastic bag. °¨ Place a towel between your skin and the bag. °¨ Leave the ice on for 15-20 minutes, 03-04 times a day for the first 2 to 3 days. After that, ice and heat may be alternated to reduce pain and spasms. °· Ask your caregiver about trying back exercises and gentle massage. This may be of some benefit. °· Avoid feeling anxious or stressed. Stress increases muscle tension and can worsen back pain. It is important to recognize when you are anxious or stressed and learn ways to manage it. Exercise is a great option. °SEEK MEDICAL CARE IF: °· You have pain that is not relieved with rest or medicine. °· You have pain that does not improve in 1 week. °· You have new symptoms. °· You are generally not feeling well. °SEEK   IMMEDIATE MEDICAL CARE IF:   You have pain that radiates from your back into your legs.  You develop new bowel or bladder control problems.  You have unusual weakness or numbness in your arms or legs.  You develop nausea or vomiting.  You develop abdominal pain.  You feel faint. Document Released: 06/08/2005 Document Revised: 12/08/2011 Document Reviewed: 10/10/2013 [facility] Patient Information 2015 [place], [place]. This information is not intended to replace advice given to you by your health care provider. Make sure you  discuss any questions you have with your health care provider. Cryotherapy Cryotherapy means treatment with cold. Ice or gel packs can be used to reduce both pain and swelling. Ice is the most helpful within the first 24 to 48 hours after an injury or flare-up from overusing a muscle or joint. Sprains, strains, spasms, burning pain, shooting pain, and aches can all be eased with ice. Ice can also be used when recovering from surgery. Ice is effective, has very few side effects, and is safe for most people to use. PRECAUTIONS  Ice is not a safe treatment option for people with:  Raynaud phenomenon. This is a condition affecting small blood vessels in the extremities. Exposure to cold may cause your problems to return.  Cold hypersensitivity. There are many forms of cold hypersensitivity, including:  Cold urticaria. Red, itchy hives appear on the skin when the tissues begin to warm after being iced.  Cold erythema. This is a red, itchy rash caused by exposure to cold.  Cold hemoglobinuria. Red blood cells break down when the tissues begin to warm after being iced. The hemoglobin that carry oxygen are passed into the urine because they cannot combine with blood proteins fast enough.  Numbness or altered sensitivity in the area being iced. If you have any of the following conditions, do not use ice until you have discussed cryotherapy with your caregiver:  Heart conditions, such as arrhythmia, angina, or chronic heart disease.  High blood pressure.  Healing wounds or open skin in the area being iced.  Current infections.  Rheumatoid arthritis.  Poor circulation.  Diabetes. Ice slows the blood flow in the region it is applied. This is beneficial when trying to stop inflamed tissues from spreading irritating chemicals to surrounding tissues. However, if you expose your skin to cold temperatures for too long or without the proper protection, you can damage your skin or nerves. Watch for  signs of skin damage due to cold. HOME CARE INSTRUCTIONS Follow these tips to use ice and cold packs safely.  Place a dry or damp towel between the ice and skin. A damp towel will cool the skin more quickly, so you may need to shorten the time that the ice is used.  For a more rapid response, add gentle compression to the ice.  Ice for no more than 10 to 20 minutes at a time. The bonier the area you are icing, the less time it will take to get the benefits of ice.  Check your skin after 5 minutes to make sure there are no signs of a poor response to cold or skin damage.  Rest 20 minutes or more between uses.  Once your skin is numb, you can end your treatment. You can test numbness by very lightly touching your skin. The touch should be so light that you do not see the skin dimple from the pressure of your fingertip. When using ice, most people will feel these normal sensations in this order: cold,  burning, aching, and numbness.  Do not use ice on someone who cannot communicate their responses to pain, such as small children or people with dementia. HOW TO MAKE AN ICE PACK Ice packs are the most common way to use ice therapy. Other methods include ice massage, ice baths, and cryosprays. Muscle creams that cause a cold, tingly feeling do not offer the same benefits that ice offers and should not be used as a substitute unless recommended by your caregiver. To make an ice pack, do one of the following:  Place crushed ice or a bag of frozen vegetables in a sealable plastic bag. Squeeze out the excess air. Place this bag inside another plastic bag. Slide the bag into a pillowcase or place a damp towel between your skin and the bag.  Mix 3 parts water with 1 part rubbing alcohol. Freeze the mixture in a sealable plastic bag. When you remove the mixture from the freezer, it will be slushy. Squeeze out the excess air. Place this bag inside another plastic bag. Slide the bag into a pillowcase or place  a damp towel between your skin and the bag. SEEK MEDICAL CARE IF:  You develop white spots on your skin. This may give the skin a blotchy (mottled) appearance.  Your skin turns blue or pale.  Your skin becomes waxy or hard.  Your swelling gets worse. MAKE SURE YOU:   Understand these instructions.  Will watch your condition.  Will get help right away if you are not doing well or get worse. Document Released: 02/02/2011 Document Revised: 10/23/2013 Document Reviewed: 02/02/2011 [facility] Patient Information 2015 [place], [place]. This information is not intended to replace advice given to you by your health care provider. Make sure you discuss any questions you have with your health care provider.

## 2014-04-20 NOTE — ED Notes (Signed)
Pt ambulating independently w/ steady gait on d/c in no acute distress, A&Ox4. D/c instructions reviewed w/ pt and family - pt and family deny any further questions or concerns at present. Rx given x3  

## 2014-06-11 ENCOUNTER — Other Ambulatory Visit (HOSPITAL_COMMUNITY): Payer: Self-pay | Admitting: Primary Care

## 2014-06-11 DIAGNOSIS — Z1231 Encounter for screening mammogram for malignant neoplasm of breast: Secondary | ICD-10-CM

## 2014-07-23 ENCOUNTER — Other Ambulatory Visit (HOSPITAL_COMMUNITY): Payer: Self-pay | Admitting: *Deleted

## 2014-07-23 DIAGNOSIS — Z09 Encounter for follow-up examination after completed treatment for conditions other than malignant neoplasm: Secondary | ICD-10-CM

## 2014-08-02 ENCOUNTER — Ambulatory Visit (HOSPITAL_COMMUNITY)
Admission: RE | Admit: 2014-08-02 | Discharge: 2014-08-02 | Disposition: A | Discharge status: Home / Self Care | Payer: Self-pay | Source: Ambulatory Visit | Attending: Obstetrics and Gynecology | Admitting: Obstetrics and Gynecology

## 2014-08-02 ENCOUNTER — Ambulatory Visit
Admission: RE | Admit: 2014-08-02 | Discharge: 2014-08-02 | Disposition: A | Discharge status: Home / Self Care | Payer: Self-pay | Source: Ambulatory Visit | Attending: Obstetrics and Gynecology | Admitting: Obstetrics and Gynecology

## 2014-08-02 ENCOUNTER — Encounter (HOSPITAL_COMMUNITY): Payer: Self-pay

## 2014-08-02 ENCOUNTER — Ambulatory Visit
Admission: RE | Admit: 2014-08-02 | Discharge: 2014-08-02 | Disposition: A | Discharge status: Home / Self Care | Payer: No Typology Code available for payment source | Source: Ambulatory Visit | Attending: Obstetrics and Gynecology | Admitting: Obstetrics and Gynecology

## 2014-08-02 VITALS — BP 110/78 | Temp 97.7°F | Ht 63.0 in | Wt 144.8 lb

## 2014-08-02 DIAGNOSIS — Z1239 Encounter for other screening for malignant neoplasm of breast: Secondary | ICD-10-CM

## 2014-08-02 DIAGNOSIS — N6315 Unspecified lump in the right breast, overlapping quadrants: Secondary | ICD-10-CM

## 2014-08-02 DIAGNOSIS — Z09 Encounter for follow-up examination after completed treatment for conditions other than malignant neoplasm: Secondary | ICD-10-CM

## 2014-08-02 DIAGNOSIS — N6310 Unspecified lump in the right breast, unspecified quadrant: Secondary | ICD-10-CM

## 2014-08-02 NOTE — Progress Notes (Signed)
Complaints of left breast lump since June 2013.  Pap Smear:  Pap smear not completed today. Last Pap smear was in December 2015 at [facility] and normal per patient. Patient has a history of an abnormal Pap smear 06/08/2012 that was LSIL that required cryotherapy for follow-up. Will get last Pap smear result and scan into EPIC. Prior Pap smear 06/08/2012 is in EPIC.  Physical exam: Breasts Breasts symmetrical. No skin abnormalities bilateral breasts. No nipple retraction bilateral breasts. No nipple discharge bilateral breasts. No lymphadenopathy. No lumps palpated left breast. Palpated a lump about the size of a pea within the right breast at 3 o'clock 5.5 cm from the nipple. Complaints of tenderness when palpated lump. Referred patient to the [facility] for diagnostic mammogram. Appointment scheduled for Thursday, August 02, 2014 at 1520.      Pelvic/Bimanual No Pap smear completed today since last Pap smear was in December 2015 per patient. Pap smear not indicated per BCCCP guidelines.

## 2014-08-02 NOTE — Patient Instructions (Signed)
Education materials on breast awareness given. Explained to Gail Moreno that they did not need a Pap smear today due to last Pap smear was in December 2015 per patient. Let her know that due to her recent history of an abnormal Pap smear that her next Pap smear will be due in December 2016. Informed patient she can have Pap smear completed in [facility] and to call Sabrina to schedule appointment. Referred patient to the [facility] for diagnostic mammogram. Appointment scheduled for Thursday, August 02, 2014 at 1520. Patient aware of appointment and will be there. Gail Moreno verbalized understanding.  Idell Hissong, Arvil Chaco, RN 2:51 PM

## 2015-01-03 ENCOUNTER — Encounter (HOSPITAL_COMMUNITY): Payer: Self-pay | Admitting: *Deleted

## 2015-01-03 ENCOUNTER — Emergency Department (INDEPENDENT_AMBULATORY_CARE_PROVIDER_SITE_OTHER): Payer: Self-pay

## 2015-01-03 ENCOUNTER — Emergency Department (HOSPITAL_COMMUNITY)
Admission: EM | Admit: 2015-01-03 | Discharge: 2015-01-03 | Disposition: A | Discharge status: Home / Self Care | Payer: Self-pay | Source: Home / Self Care | Attending: Family Medicine | Admitting: Family Medicine

## 2015-01-03 DIAGNOSIS — M10071 Idiopathic gout, right ankle and foot: Secondary | ICD-10-CM

## 2015-01-03 MED ORDER — INDOMETHACIN 50 MG PO CAPS: 50.0000 mg | ORAL_CAPSULE | Freq: Two times a day (BID) | ORAL | Status: DC

## 2015-01-03 MED ORDER — COLCHICINE 0.6 MG PO TABS: ORAL_TABLET | ORAL | Status: DC

## 2015-01-03 NOTE — Discharge Instructions (Signed)

## 2015-01-03 NOTE — ED Provider Notes (Signed)
CSN: [id number]     Arrival date & time 01/03/15  1828 History   First MD Initiated Contact with Patient 01/03/15 1843     Chief Complaint  Patient presents with  . Toe Pain   (Consider location/radiation/quality/duration/timing/severity/associated sxs/prior Treatment) HPI Comments: 44 year old female complaining of pain to the right second toe since chest today. The progression of pain is been relatively rapid. The toe is exquisitely tender and any movement produces pain. There is a small amount of swelling. She denies any known trauma to states she has been active and may have accidentally injured it without realizing it. She has no personal history of gout but her father does.   Past Medical History  Diagnosis Date  . Fibroid tumor    Past Surgical History  Procedure Laterality Date  . Wisdom tooth extraction    . Breast lumpectomy    . Foot surgery    . Skin graft    . Myringotomy with tube placement     Family History  Problem Relation Age of Onset  . Hypertension Mother   . Hypertension Father   . Hypertension Sister   . Diabetes Maternal Aunt   . Diabetes Maternal Uncle    History  Substance Use Topics  . Smoking status: Never Smoker   . Smokeless tobacco: Never Used  . Alcohol Use: Yes     Comment: occasionally   OB History    Gravida Para Term Preterm AB TAB SAB Ectopic Multiple Living   1 1 1       1      Review of Systems  Constitutional: Negative for fever, activity change and fatigue.  Gastrointestinal: Negative.   Genitourinary: Negative.   Musculoskeletal: Positive for gait problem. Negative for myalgias and back pain.  Neurological: Negative.   Psychiatric/Behavioral: Negative.     Allergies  Dilaudid  Home Medications   Prior to Admission medications   Medication Sig Start Date End Date Taking? Authorizing Provider  colchicine 0.6 MG tablet Take one tab po now, repeat in 2 hours, then 1 q d 01/03/15   Janne Napoleon, NP  indomethacin (INDOCIN)  50 MG capsule Take 1 capsule (50 mg total) by mouth 2 (two) times daily with a meal. 01/03/15   Janne Napoleon, NP   BP 118/79 mmHg  Pulse 77  Temp(Src) 98.4 F (36.9 C) (Oral)  Resp 12  SpO2 100%  LMP 12/08/2014 (Exact Date) Physical Exam  Constitutional: She is oriented to person, place, and time. She appears well-developed and well-nourished. No distress.  Neck: Normal range of motion. Neck supple.  Cardiovascular: Normal rate.   Pulmonary/Chest: Effort normal. No respiratory distress.  Musculoskeletal:  Right second toe with minor swelling. No deformities. No evidence of wound, open areas or erythema. Exquisitely tender to the IP joints. No erythema appreciated. No swelling of the foot.  Neurological: She is alert and oriented to person, place, and time.  Skin: Skin is warm and dry.  Psychiatric: She has a normal mood and affect.  Nursing note and vitals reviewed.   ED Course  Procedures (including critical care time) Labs Review Labs Reviewed  URIC ACID    Imaging Review Dg Foot Complete Right  01/03/2015   CLINICAL DATA:  44 year old female with pain in the right foot at the base of the second toe. History of surgery in 2004.  EXAM: RIGHT FOOT COMPLETE - 3+ VIEW  COMPARISON:  None.  FINDINGS: No acute fracture or dislocation. Postsurgical changes with fixation pins noted in  the first metatarsal. There is diffuse soft tissue swelling of the foot. No radiopaque foreign object.  IMPRESSION: No acute fracture.   Electronically Signed   By: Anner Crete M.D.   On: 01/03/2015 20:35     MDM   1. Acute idiopathic gout of right foot    Buddy tape toes. Indomethacin 50 mg twice a day with food when necessary Colchicine 0.6 mg 1 by mouth now and repeat in 2 hours. Then take 1 daily. #8    Janne Napoleon, NP 01/03/15 2045

## 2015-01-03 NOTE — ED Notes (Signed)
Started noticing painful right second toe while at work yesterday without any injury.  Applied Benadryl cream, and After Bite, thinking perhaps she got bit by something.  This morning woke up with swelling, redness, warmth to toe.

## 2015-01-04 LAB — POCT PREGNANCY, URINE: PREG TEST UR: NEGATIVE

## 2015-01-04 LAB — URIC ACID: URIC ACID, SERUM: 3.5 mg/dL (ref 2.3–6.6)

## 2015-03-26 ENCOUNTER — Encounter (HOSPITAL_BASED_OUTPATIENT_CLINIC_OR_DEPARTMENT_OTHER): Payer: Self-pay | Admitting: Emergency Medicine

## 2015-03-26 ENCOUNTER — Emergency Department (HOSPITAL_BASED_OUTPATIENT_CLINIC_OR_DEPARTMENT_OTHER)
Admission: EM | Admit: 2015-03-26 | Discharge: 2015-03-26 | Disposition: A | Discharge status: Home / Self Care | Payer: Self-pay | Attending: Emergency Medicine | Admitting: Emergency Medicine

## 2015-03-26 DIAGNOSIS — M779 Enthesopathy, unspecified: Secondary | ICD-10-CM

## 2015-03-26 DIAGNOSIS — M778 Other enthesopathies, not elsewhere classified: Secondary | ICD-10-CM

## 2015-03-26 DIAGNOSIS — Z86018 Personal history of other benign neoplasm: Secondary | ICD-10-CM | POA: Insufficient documentation

## 2015-03-26 MED ORDER — NAPROXEN SODIUM 550 MG PO TABS: ORAL_TABLET | ORAL | Status: DC

## 2015-03-26 MED ORDER — NAPROXEN 250 MG PO TABS
500.0000 mg | ORAL_TABLET | Freq: Once | ORAL | Status: AC
  Administered 2015-03-26: 500 mg via ORAL
  Filled 2015-03-26: qty 2

## 2015-03-26 NOTE — ED Notes (Signed)
Left thumb pain since picking up her bookbag today. No swelling or deformity. Pt is not able to grip.
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
## 2015-03-26 NOTE — ED Notes (Signed)
Left thumb and hand pain with tingling and inability to make a fist.  Pt denies previous injury.

## 2015-06-05 ENCOUNTER — Encounter (HOSPITAL_COMMUNITY): Payer: Self-pay | Admitting: Emergency Medicine

## 2015-06-05 ENCOUNTER — Emergency Department (HOSPITAL_COMMUNITY)
Admission: EM | Admit: 2015-06-05 | Discharge: 2015-06-06 | Disposition: A | Discharge status: Home / Self Care | Payer: Self-pay | Attending: Emergency Medicine | Admitting: Emergency Medicine

## 2015-06-05 DIAGNOSIS — R202 Paresthesia of skin: Secondary | ICD-10-CM | POA: Insufficient documentation

## 2015-06-05 DIAGNOSIS — M549 Dorsalgia, unspecified: Secondary | ICD-10-CM | POA: Insufficient documentation

## 2015-06-05 DIAGNOSIS — Z86018 Personal history of other benign neoplasm: Secondary | ICD-10-CM | POA: Insufficient documentation

## 2015-06-05 DIAGNOSIS — M25541 Pain in joints of right hand: Secondary | ICD-10-CM

## 2015-06-05 DIAGNOSIS — M79605 Pain in left leg: Secondary | ICD-10-CM | POA: Insufficient documentation

## 2015-06-05 DIAGNOSIS — M79644 Pain in right finger(s): Secondary | ICD-10-CM | POA: Insufficient documentation

## 2015-06-05 DIAGNOSIS — Z3202 Encounter for pregnancy test, result negative: Secondary | ICD-10-CM | POA: Insufficient documentation

## 2015-06-05 LAB — CBC WITH DIFFERENTIAL/PLATELET
BASOS ABS: 0 10*3/uL (ref 0.0–0.1)
BASOS PCT: 1 %
EOS PCT: 2 %
Eosinophils Absolute: 0.1 10*3/uL (ref 0.0–0.7)
HCT: 29.9 % — ABNORMAL LOW (ref 36.0–46.0)
Hemoglobin: 9.5 g/dL — ABNORMAL LOW (ref 12.0–15.0)
Lymphocytes Relative: 43 %
Lymphs Abs: 1.7 10*3/uL (ref 0.7–4.0)
MCH: 27.3 pg (ref 26.0–34.0)
MCHC: 31.8 g/dL (ref 30.0–36.0)
MCV: 85.9 fL (ref 78.0–100.0)
MONO ABS: 0.3 10*3/uL (ref 0.1–1.0)
Monocytes Relative: 7 %
Neutro Abs: 1.9 10*3/uL (ref 1.7–7.7)
Neutrophils Relative %: 47 %
PLATELETS: 270 10*3/uL (ref 150–400)
RBC: 3.48 MIL/uL — ABNORMAL LOW (ref 3.87–5.11)
RDW: 14.4 % (ref 11.5–15.5)
WBC: 4 10*3/uL (ref 4.0–10.5)

## 2015-06-05 LAB — URINE MICROSCOPIC-ADD ON

## 2015-06-05 LAB — URINALYSIS, ROUTINE W REFLEX MICROSCOPIC
Bilirubin Urine: NEGATIVE
GLUCOSE, UA: NEGATIVE mg/dL
KETONES UR: NEGATIVE mg/dL
Leukocytes, UA: NEGATIVE
Nitrite: NEGATIVE
PROTEIN: NEGATIVE mg/dL
Specific Gravity, Urine: 1.029 (ref 1.005–1.030)
pH: 6.5 (ref 5.0–8.0)

## 2015-06-05 LAB — BASIC METABOLIC PANEL
ANION GAP: 7 (ref 5–15)
BUN: 20 mg/dL (ref 6–20)
CALCIUM: 8.8 mg/dL — AB (ref 8.9–10.3)
CO2: 23 mmol/L (ref 22–32)
Chloride: 108 mmol/L (ref 101–111)
Creatinine, Ser: 0.96 mg/dL (ref 0.44–1.00)
GLUCOSE: 90 mg/dL (ref 65–99)
Potassium: 3.5 mmol/L (ref 3.5–5.1)
Sodium: 138 mmol/L (ref 135–145)

## 2015-06-05 LAB — POC URINE PREG, ED: Preg Test, Ur: NEGATIVE

## 2015-06-05 NOTE — ED Notes (Signed)
Pt. reports intermittent right thumb numbness for 1 week , pt. added left upper thigh pain radiating to left knee onset last month , denies injury / ambulatory .

## 2015-06-06 MED ORDER — NAPROXEN SODIUM 550 MG PO TABS: ORAL_TABLET | ORAL | Status: DC

## 2015-06-06 NOTE — ED Provider Notes (Signed)
CSN: [id number]     Arrival date & time 06/05/15  2206 History   First MD Initiated Contact with Patient 06/05/15 2336     Chief Complaint  Patient presents with  . Numbness  . Leg Pain     (Consider location/radiation/quality/duration/timing/severity/associated sxs/prior Treatment) HPI Comments: Patient presents to the emergency department tonight with 2 complaints. Her first complaint is right thumb weakness and intermittent numbness/paresthesias over the past 4 days. 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 No wrist pain. No treatments prior to arrival. No redness.   Also complains of one month of left lower extremity pain. Pain starts in the crease of her groin and shoots down her anterior thigh to her knee. Occasionally she will have a tingling sensation in her toes. She denies weakness but sometimes the pain makes her leg give out. Patient denies warning symptoms of back pain including: fecal incontinence, urinary retention or overflow incontinence, night sweats, waking from sleep with back pain, unexplained fevers or weight loss, h/o cancer, IVDU, recent trauma. No treatments for this prior to arrival. She does not have a history of back or disc problems. No falls or recent injury. She is able to walk without difficulty.     Patient is a 44 y.o. female presenting with leg pain. The history is provided by the patient.  Leg Pain Associated symptoms: back pain   Associated symptoms: no fever and no neck pain     Past Medical History  Diagnosis Date  . Fibroid tumor    Past Surgical History  Procedure Laterality Date  . Wisdom tooth extraction    . Breast lumpectomy    . Foot surgery    . Skin graft    . Myringotomy with tube placement     Family History  Problem Relation Age of Onset  . Hypertension Mother   . Hypertension Father   . Hypertension Sister   . Diabetes Maternal Aunt   . Diabetes Maternal Uncle    Social History  Substance Use Topics  .  Smoking status: Never Smoker   . Smokeless tobacco: Never Used  . Alcohol Use: Yes     Comment: occasionally   OB History    Gravida Para Term Preterm AB TAB SAB Ectopic Multiple Living   1 1 1       1      Review of Systems  Constitutional: Negative for fever and unexpected weight change.  Gastrointestinal: Negative for constipation.       Negative for fecal incontinence.   Genitourinary: Negative for dysuria, hematuria, flank pain and pelvic pain.       Negative for urinary incontinence or retention.  Musculoskeletal: Positive for myalgias and back pain. Negative for gait problem and neck pain.  Neurological: Positive for numbness. Negative for weakness.       Denies saddle paresthesias.      Allergies  Dilaudid  Home Medications   Prior to Admission medications   Medication Sig Start Date End Date Taking? Authorizing Provider  naproxen sodium (ANAPROX DS) 550 MG tablet Take 1 tablet twice daily with a meal as needed for pain. 03/26/15   John Molpus, MD   BP 117/81 mmHg  Pulse 78  Temp(Src) 98.5 F (36.9 C) (Oral)  Resp 18  Ht 5\' 3"  (1.6 m)  Wt 64.864 kg  BMI 25.34 kg/m2  SpO2 98%  LMP 05/30/2015   Physical Exam  Constitutional: She appears well-developed and well-nourished.  HENT:  Head: Normocephalic and atraumatic.  Eyes: Conjunctivae are normal.  Neck: Normal range of motion. Neck supple.  Pulmonary/Chest: Effort normal.  Abdominal: Soft. There is no tenderness. There is no CVA tenderness.  Musculoskeletal: Normal range of motion. She exhibits tenderness.       Right elbow: Normal.      Right wrist: Normal.       Left hip: She exhibits normal range of motion, normal strength and no tenderness.       Cervical back: She exhibits normal range of motion, no tenderness and no bony tenderness.       Thoracic back: She exhibits normal range of motion, no tenderness and no bony tenderness.       Lumbar back: She exhibits normal range of motion, no tenderness and  no bony tenderness.       Right hand: She exhibits tenderness. She exhibits normal range of motion. Normal sensation noted. Normal strength noted.       Hands:      Legs: No step-off noted with palpation of spine.   Neurological: She is alert. She has normal strength and normal reflexes. No sensory deficit.  5/5 strength in entire lower extremities bilaterally. No sensation deficit.   Skin: Skin is warm and dry. No rash noted.  Psychiatric: She has a normal mood and affect.  Nursing note and vitals reviewed.   ED Course  Procedures (including critical care time) Labs Review Labs Reviewed  CBC WITH DIFFERENTIAL/PLATELET - Abnormal; Notable for the following:    RBC 3.48 (*)    Hemoglobin 9.5 (*)    HCT 29.9 (*)    All other components within normal limits  BASIC METABOLIC PANEL - Abnormal; Notable for the following:    Calcium 8.8 (*)    All other components within normal limits  URINALYSIS, ROUTINE W REFLEX MICROSCOPIC (NOT AT [facility]) - Abnormal; Notable for the following:    APPearance CLOUDY (*)    Hgb urine dipstick TRACE (*)    All other components within normal limits  URINE MICROSCOPIC-ADD ON - Abnormal; Notable for the following:    Squamous Epithelial / LPF 0-5 (*)    Bacteria, UA FEW (*)    All other components within normal limits  POC URINE PREG, ED    Imaging Review No results found. I have personally reviewed and evaluated these images and lab results as part of my medical decision-making.   EKG Interpretation None       12:01 AM Patient seen and examined.   Vital signs reviewed and are as follows: BP 117/81 mmHg  Pulse 78  Temp(Src) 98.5 F (36.9 C) (Oral)  Resp 18  Ht 5\' 3"  (1.6 m)  Wt 64.864 kg  BMI 25.34 kg/m2  SpO2 98%  LMP 05/30/2015  For right thumb pain, will prescribe rest, NSAIDs, splint, orthopedic follow-up as needed.  For thigh pain, feel that given symptoms occurring for 1 month -- patient will need ortho f/u. Symptoms seem most  consistent with neuropathic pain given dermatomal distribution and shooting nature. No red flag s/s of low back pain. Patient was counseled on back pain precautions and told to do activity as tolerated but do not lift, push, or pull heavy objects more than 10 pounds for the next week.  Patient counseled to use ice or heat on back for no longer than 15 minutes every hour.    Urged to return with worsening severe pain, loss of bowel or bladder control, trouble walking.   The patient verbalizes understanding and agrees  with the plan.   MDM   Final diagnoses:  Pain in thumb joint with movement, right  Left leg pain   Thumb pain: Likely tendinitis or overuse injury. Conservative measures indicated.  Thigh pain: No red flags of back pain, patient doesn't really have any back pain. However symptoms are more neurologic in nature. No leg weakness. Neuro exam is otherwise negative. Left lower extremity is vascularly intact. Treat as above with follow-up with orthopedic.    Carlisle Cater, PA-C 06/06/15 [place], DO 06/06/15 [id number]

## 2015-06-06 NOTE — Progress Notes (Signed)
Orthopedic Tech Progress Note Patient Details:  Gail Moreno 1970-07-06 [id number] Applied Velcro thumb spica splint to RUE.  Pulses, sensation, motion intact before and after application.  Capillary refill less than 2 seconds before anda after application. Ortho Devices Type of Ortho Device: Thumb velcro splint Splint Material: Other (comment) Ortho Device/Splint Location: RUE Ortho Device/Splint Interventions: Application   Darrol Poke 06/06/2015, 12:47 AM

## 2015-06-06 NOTE — Discharge Instructions (Signed)
Please read and follow all provided instructions.  Your diagnoses today include:  1. Pain in thumb joint with movement, right   2. Left leg pain    Tests performed today include:  Vital signs - see below for your results today  Medications prescribed:   Naproxen - anti-inflammatory pain medication  Do not exceed 500mg  naproxen every 12 hours, take with food  You have been prescribed an anti-inflammatory medication or NSAID. Take with food. Take smallest effective dose for the shortest duration needed for your pain. Stop taking if you experience stomach pain or vomiting.   Take any prescribed medications only as directed.  Home care instructions:   Follow any educational materials contained in this packet  Please rest, use ice or heat on your back for the next several days  Do not lift, push, pull anything more than 10 pounds for the next week  Follow-up instructions: Please follow-up with your primary care provider or the orthopedist listed in the next 1 week for further evaluation of your symptoms.   Return instructions:  SEEK IMMEDIATE MEDICAL ATTENTION IF YOU HAVE:  New numbness, tingling, weakness, or problem with the use of your arms or legs  Severe back pain not relieved with medications  Loss control of your bowels or bladder  Increasing pain in any areas of the body (such as chest or abdominal pain)  Shortness of breath, dizziness, or fainting.   Worsening nausea (feeling sick to your stomach), vomiting, fever, or sweats  Any other emergent concerns regarding your health   Additional Information:  Your vital signs today were: BP 117/81 mmHg   Pulse 78   Temp(Src) 98.5 F (36.9 C) (Oral)   Resp 18   Ht 5\' 3"  (1.6 m)   Wt 64.864 kg   BMI 25.34 kg/m2   SpO2 98%   LMP 05/30/2015 If your blood pressure (BP) was elevated above 135/85 this visit, please have this repeated by your doctor within one month. --------------
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
## 2015-08-07 ENCOUNTER — Ambulatory Visit: Payer: Self-pay

## 2015-09-30 ENCOUNTER — Ambulatory Visit (HOSPITAL_COMMUNITY)
Admission: EM | Admit: 2015-09-30 | Discharge: 2015-09-30 | Disposition: A | Discharge status: Home / Self Care | Payer: No Typology Code available for payment source | Attending: Emergency Medicine | Admitting: Emergency Medicine

## 2015-09-30 ENCOUNTER — Ambulatory Visit (INDEPENDENT_AMBULATORY_CARE_PROVIDER_SITE_OTHER): Payer: No Typology Code available for payment source

## 2015-09-30 DIAGNOSIS — R1110 Vomiting, unspecified: Secondary | ICD-10-CM

## 2015-09-30 DIAGNOSIS — J189 Pneumonia, unspecified organism: Secondary | ICD-10-CM

## 2015-09-30 MED ORDER — HYDROCODONE-HOMATROPINE 5-1.5 MG/5ML PO SYRP: 5.0000 mL | ORAL_SOLUTION | Freq: Four times a day (QID) | ORAL | Status: DC | PRN

## 2015-09-30 MED ORDER — AZITHROMYCIN 250 MG PO TABS: ORAL_TABLET | ORAL | Status: DC

## 2015-09-30 NOTE — ED Notes (Signed)
Pt has had vomiting for about a month on and off,pt states that her cough usually starts her symptoms Pt has brought up phlegm. Pt has tried OTC cough medications to prevent cough Pt has appt with her PCP on Monday Pt alert and oriented

## 2015-09-30 NOTE — Discharge Instructions (Signed)
Your x-ray shows an area concerning for pneumonia. This is why you are coughing. The cough is causing your vomiting. Take azithromycin as prescribed. Use the Hycodan every 4-6 hours as needed for cough. Do not drive while taking this medicine. Please follow-up with your primary care doctor as scheduled next week for a recheck.

## 2015-09-30 NOTE — ED Provider Notes (Signed)
CSN: [id number]     Arrival date & time 09/30/15  1407 History   First MD Initiated Contact with Patient 09/30/15 1503     Chief Complaint  Patient presents with  . Emesis   (Consider location/radiation/quality/duration/timing/severity/associated sxs/prior Treatment) HPI She is a 45 year old woman here for evaluation of vomiting. She states for the last 3 weeks she has had posttussive emesis. She states she will feel a tickle in her throat and have a coughing spasm that will result in gagging and vomiting. She states it is mostly phlegm that she throws up, but has seen food in it occasionally. She states this occurred 7 or 8 times 2 days ago, didn't happen at all yesterday. She had another 2 episodes this morning. She denies any blood in the vomit. She does report runny nose, sore throat, ear popping as well. No known fevers. No shortness of breath. She started her menstrual period today. She does report some pain in the right lower quadrant over the last several days.  Past Medical History  Diagnosis Date  . Fibroid tumor    Past Surgical History  Procedure Laterality Date  . Wisdom tooth extraction    . Breast lumpectomy    . Foot surgery    . Skin graft    . Myringotomy with tube placement     Family History  Problem Relation Age of Onset  . Hypertension Mother   . Hypertension Father   . Hypertension Sister   . Diabetes Maternal Aunt   . Diabetes Maternal Uncle    Social History  Substance Use Topics  . Smoking status: Never Smoker   . Smokeless tobacco: Never Used  . Alcohol Use: Yes     Comment: occasionally   OB History    Gravida Para Term Preterm AB TAB SAB Ectopic Multiple Living   1 1 1       1      Review of Systems As in history of present illness Allergies  Dilaudid  Home Medications   Prior to Admission medications   Medication Sig Start Date End Date Taking? Authorizing Provider  azithromycin (ZITHROMAX Z-PAK) 250 MG tablet Take 2 pills today, then 1  pill daily until gone. 09/30/15   Melony Overly, MD  HYDROcodone-homatropine (HYCODAN) 5-1.5 MG/5ML syrup Take 5 mLs by mouth every 6 (six) hours as needed for cough. 09/30/15   Melony Overly, MD  naproxen sodium (ANAPROX DS) 550 MG tablet Take 1 tablet twice daily with a meal as needed for pain. 06/06/15   Carlisle Cater, PA-C   Meds Ordered and Administered this Visit  Medications - No data to display  BP 129/78 mmHg  Pulse 74  Temp(Src) 99.4 F (37.4 C) (Oral)  Resp 16  SpO2 100%  LMP 09/30/2015 (Exact Date) No data found.   Physical Exam  Constitutional: She is oriented to person, place, and time. She appears well-developed and well-nourished.  HENT:  Nose: Nose normal.  Mouth/Throat: No oropharyngeal exudate.  TMs normal bilaterally. Oropharynx clear except for minor postnasal drainage.  Neck: Neck supple.  Cardiovascular: Normal rate, regular rhythm and normal heart sounds.   No murmur heard. Pulmonary/Chest: Effort normal and breath sounds normal. No respiratory distress. She has no wheezes. She has no rales.  Abdominal: Soft. Bowel sounds are normal.  Uterus comes up to the level of the umbilicus. It is mildly tender, primarily on the right side. She has a known history of fibroids. Based on ultrasound several years ago, these  fibroids are quite large.  Neurological: She is alert and oriented to person, place, and time.    ED Course  Procedures (including critical care time)  Labs Review Labs Reviewed - No data to display  Imaging Review Dg Chest 2 View  09/30/2015  CLINICAL DATA:  Persistent cough. EXAM: CHEST  2 VIEW COMPARISON:  None. FINDINGS: There is a vague small area of increased density at the left lung base posterior medially. The lungs are otherwise clear. Heart size and pulmonary vascularity are normal. Bones are normal. IMPRESSION: Small vague area of density at the left lung base posteriorly. The possibility of a focal infiltrate or ill-defined mass should be  considered. Electronically Signed   By: Lorriane Shire M.D.   On: 09/30/2015 15:51     MDM   1. CAP (community acquired pneumonia)   2. Post-tussive emesis    Will treat for CAP with azithromycin. Hycodan for the cough. Discussed that her vomiting is coming from the cough. Follow-up with her PCP next week as scheduled.    Melony Overly, MD 09/30/15 [phone]

## 2015-11-12 ENCOUNTER — Other Ambulatory Visit: Payer: Self-pay | Admitting: Primary Care

## 2015-11-12 DIAGNOSIS — Z1231 Encounter for screening mammogram for malignant neoplasm of breast: Secondary | ICD-10-CM

## 2015-11-13 ENCOUNTER — Ambulatory Visit
Admission: RE | Admit: 2015-11-13 | Discharge: 2015-11-13 | Disposition: A | Discharge status: Home / Self Care | Payer: No Typology Code available for payment source | Source: Ambulatory Visit | Attending: Primary Care | Admitting: Primary Care

## 2015-11-13 ENCOUNTER — Emergency Department (HOSPITAL_BASED_OUTPATIENT_CLINIC_OR_DEPARTMENT_OTHER)
Admission: EM | Admit: 2015-11-13 | Discharge: 2015-11-13 | Disposition: A | Discharge status: Home / Self Care | Payer: No Typology Code available for payment source | Attending: Emergency Medicine | Admitting: Emergency Medicine

## 2015-11-13 ENCOUNTER — Encounter (HOSPITAL_BASED_OUTPATIENT_CLINIC_OR_DEPARTMENT_OTHER): Payer: Self-pay

## 2015-11-13 ENCOUNTER — Emergency Department (HOSPITAL_BASED_OUTPATIENT_CLINIC_OR_DEPARTMENT_OTHER): Payer: No Typology Code available for payment source

## 2015-11-13 DIAGNOSIS — R519 Headache, unspecified: Secondary | ICD-10-CM

## 2015-11-13 DIAGNOSIS — R51 Headache: Secondary | ICD-10-CM | POA: Insufficient documentation

## 2015-11-13 DIAGNOSIS — Z1231 Encounter for screening mammogram for malignant neoplasm of breast: Secondary | ICD-10-CM

## 2015-11-13 MED ORDER — METOCLOPRAMIDE HCL 5 MG/ML IJ SOLN
10.0000 mg | Freq: Once | INTRAMUSCULAR | Status: AC
  Administered 2015-11-13: 10 mg via INTRAVENOUS
  Filled 2015-11-13: qty 2

## 2015-11-13 MED ORDER — KETOROLAC TROMETHAMINE 15 MG/ML IJ SOLN
15.0000 mg | Freq: Once | INTRAMUSCULAR | Status: AC
  Administered 2015-11-13: 15 mg via INTRAVENOUS
  Filled 2015-11-13: qty 1

## 2015-11-13 MED ORDER — SODIUM CHLORIDE 0.9 % IV BOLUS (SEPSIS)
1000.0000 mL | Freq: Once | INTRAVENOUS | Status: AC
  Administered 2015-11-13: 1000 mL via INTRAVENOUS

## 2015-11-13 MED ORDER — DIPHENHYDRAMINE HCL 50 MG/ML IJ SOLN
25.0000 mg | Freq: Once | INTRAMUSCULAR | Status: AC
  Administered 2015-11-13: 25 mg via INTRAVENOUS
  Filled 2015-11-13: qty 1

## 2015-11-13 NOTE — ED Notes (Signed)
Patient transported to CT 

## 2015-11-13 NOTE — ED Notes (Signed)
Pa  at bedside. 

## 2015-11-13 NOTE — Discharge Instructions (Signed)
Please follow up immediately if new or worsening signs or symptoms present

## 2015-11-13 NOTE — ED Notes (Signed)
Pt c/o headache that started about 25min PTA (19:36). Pt has associated nausea, but denies dizziness or blurred vision.

## 2015-11-13 NOTE — ED Provider Notes (Signed)
CSN: [id number]     Arrival date & time 11/13/15  1941 History   First MD Initiated Contact with Patient 11/13/15 2057     Chief Complaint  Patient presents with  . Headache    HPI   45 year old female presents today with headache. Patient reports symptoms started at approximately 6:45 PM, and developed quickly. She reports symptoms were severe. She notes the pain was diffuse, then eased off, coming back again. She describes it as throbbing, with associated nausea. Patient denies any significant past medical history of headaches. Patient denies any fever, neck stiffness, trauma to the head, changes in smell taste or vision. She denies any focal neurological deficits. Patient has not taking any meds prior to arrival.   Past Medical History  Diagnosis Date  . Fibroid tumor    Past Surgical History  Procedure Laterality Date  . Wisdom tooth extraction    . Breast lumpectomy    . Foot surgery    . Skin graft    . Myringotomy with tube placement     Family History  Problem Relation Age of Onset  . Hypertension Mother   . Hypertension Father   . Hypertension Sister   . Diabetes Maternal Aunt   . Diabetes Maternal Uncle    Social History  Substance Use Topics  . Smoking status: Never Smoker   . Smokeless tobacco: Never Used  . Alcohol Use: Yes     Comment: occasionally   OB History    Gravida Para Term Preterm AB TAB SAB Ectopic Multiple Living   1 1 1       1      Review of Systems  All other systems reviewed and are negative.  Allergies  Dilaudid  Home Medications   Prior to Admission medications   Medication Sig Start Date End Date Taking? Authorizing Provider  azithromycin (ZITHROMAX Z-PAK) 250 MG tablet Take 2 pills today, then 1 pill daily until gone. 09/30/15   Melony Overly, MD  HYDROcodone-homatropine (HYCODAN) 5-1.5 MG/5ML syrup Take 5 mLs by mouth every 6 (six) hours as needed for cough. 09/30/15   Melony Overly, MD  naproxen sodium (ANAPROX DS) 550 MG tablet  Take 1 tablet twice daily with a meal as needed for pain. 06/06/15   Carlisle Cater, PA-C   BP 132/80 mmHg  Pulse 77  Temp(Src) 98.2 F (36.8 C) (Oral)  Resp 20  Ht 5\' 3"  (1.6 m)  Wt 67.586 kg  BMI 26.40 kg/m2  SpO2 100%  LMP 10/31/2015   Physical Exam  Constitutional: She is oriented to person, place, and time. She appears well-developed and well-nourished. No distress.  HENT:  Head: Normocephalic and atraumatic.  Eyes: Conjunctivae and EOM are normal. Pupils are equal, round, and reactive to light. Right eye exhibits no discharge. Left eye exhibits no discharge. No scleral icterus.  Neck: Normal range of motion. Neck supple. No JVD present. No tracheal deviation present.  Pulmonary/Chest: Effort normal. No stridor.  Musculoskeletal: Normal range of motion. She exhibits no edema or tenderness.  Lymphadenopathy:    She has no cervical adenopathy.  Neurological: She is alert and oriented to person, place, and time. She has normal strength. She displays no atrophy and no tremor. No cranial nerve deficit or sensory deficit. She exhibits normal muscle tone. She displays a negative Romberg sign. She displays no seizure activity. Coordination and gait normal. GCS eye subscore is 4. GCS verbal subscore is 5. GCS motor subscore is 6.  Reflex Scores:  Patellar reflexes are 2+ on the right side and 2+ on the left side. Skin: She is not diaphoretic.  Psychiatric: She has a normal mood and affect. Her behavior is normal. Judgment and thought content normal.  Nursing note and vitals reviewed.   ED Course  Procedures (including critical care time) Labs Review Labs Reviewed - No data to display  Imaging Review Ct Head Wo Contrast  11/13/2015  CLINICAL DATA:  Posterior headache 3 hours improving. Some nausea and coughing with dizziness several weeks. EXAM: CT HEAD WITHOUT CONTRAST TECHNIQUE: Contiguous axial images were obtained from the base of the skull through the vertex without  intravenous contrast. COMPARISON:  04/05/2014 FINDINGS: Ventricles, cisterns and other CSF spaces are within normal. There is no mass, mass effect, shift of midline structures or acute hemorrhage. No evidence of acute infarction. Remaining bones and soft tissues normal. IMPRESSION: No acute intracranial findings. Electronically Signed   By: Marin Olp M.D.   On: 11/13/2015 22:37   I have personally reviewed and evaluated these images and lab results as part of my medical decision-making.   EKG Interpretation None      MDM   Final diagnoses:  Acute nonintractable headache, unspecified headache type    Labs:  Imaging: CT head without  Consults:  Therapeutics: Toradol, Reglan, Benadryl  Discharge Meds:   Assessment/Plan: 45 year old female presents today with acute onset of headache. Acute onset with severe symptoms raise concern for subarachnoid. CT scan here shows no significant findings, this is within six-hour timeframe, patient's symptoms have nearly resolved, she had no neurological deficits or any other concerning signs or symptoms. Due to negative head CT, symptomatic improvement, no need for LP at this time. Patient will be discharged home with symptomatic care instructions and strict return precautions. Patient verbalized understanding and agreement today's plan. Patient was accompanied by her mother who also verbalized understanding and agreement today's plan.        Okey Regal, PA-C 11/13/15 [place], MD 11/13/15 [phone]

## 2016-02-13 ENCOUNTER — Encounter: Payer: Self-pay | Admitting: *Deleted

## 2016-03-19 ENCOUNTER — Encounter: Payer: Self-pay | Admitting: Family Medicine

## 2016-03-20 ENCOUNTER — Encounter: Payer: Self-pay | Admitting: Family Medicine

## 2016-03-20 ENCOUNTER — Other Ambulatory Visit (HOSPITAL_COMMUNITY)
Admission: RE | Admit: 2016-03-20 | Discharge: 2016-03-20 | Disposition: A | Discharge status: Home / Self Care | Payer: Self-pay | Source: Ambulatory Visit | Attending: Family Medicine | Admitting: Family Medicine

## 2016-03-20 ENCOUNTER — Ambulatory Visit (INDEPENDENT_AMBULATORY_CARE_PROVIDER_SITE_OTHER): Payer: Self-pay | Admitting: Family Medicine

## 2016-03-20 VITALS — BP 123/73 | HR 64 | Wt 143.3 lb

## 2016-03-20 DIAGNOSIS — D509 Iron deficiency anemia, unspecified: Secondary | ICD-10-CM | POA: Insufficient documentation

## 2016-03-20 DIAGNOSIS — N921 Excessive and frequent menstruation with irregular cycle: Secondary | ICD-10-CM

## 2016-03-20 DIAGNOSIS — Z124 Encounter for screening for malignant neoplasm of cervix: Secondary | ICD-10-CM

## 2016-03-20 DIAGNOSIS — D259 Leiomyoma of uterus, unspecified: Secondary | ICD-10-CM | POA: Insufficient documentation

## 2016-03-20 DIAGNOSIS — D500 Iron deficiency anemia secondary to blood loss (chronic): Secondary | ICD-10-CM

## 2016-03-20 DIAGNOSIS — Z1151 Encounter for screening for human papillomavirus (HPV): Secondary | ICD-10-CM

## 2016-03-20 LAB — CBC
HEMATOCRIT: 35.8 % (ref 35.0–45.0)
HEMOGLOBIN: 12.1 g/dL (ref 11.7–15.5)
MCH: 29.4 pg (ref 27.0–33.0)
MCHC: 33.8 g/dL (ref 32.0–36.0)
MCV: 87.1 fL (ref 80.0–100.0)
MPV: 11 fL (ref 7.5–12.5)
Platelets: 261 10*3/uL (ref 140–400)
RBC: 4.11 MIL/uL (ref 3.80–5.10)
RDW: 14.6 % (ref 11.0–15.0)
WBC: 3.2 10*3/uL — AB (ref 3.8–10.8)

## 2016-03-20 LAB — TSH: TSH: 1.96 m[IU]/L

## 2016-03-20 LAB — POCT PREGNANCY, URINE: PREG TEST UR: NEGATIVE

## 2016-03-20 MED ORDER — MEGESTROL ACETATE 40 MG PO TABS: 40.0000 mg | ORAL_TABLET | Freq: Two times a day (BID) | ORAL | 3 refills | Status: DC

## 2016-03-20 NOTE — Progress Notes (Signed)
   Subjective:    Patient ID: Gail Moreno is a 45 y.o. female presenting with Gynecologic Exam  on 03/20/2016  HPI: Reports cycles have changed since 5/17. Lasting longer than usual and is coming every 3 wks. She denies heavy bleeding. Before that she reports that cycles were normal and lasted 1 week. Told that her iron was low and placed on iron supplementation. Has known fibroids since 2013. Previously seen by [facility]. Has had cryotherapy of her cervix. Last pap with PCP 2 years ago and notes pap was normal last visit, but has had HPV in the past..  Review of Systems  Constitutional: Negative for chills and fever.  Respiratory: Negative for shortness of breath.   Cardiovascular: Negative for chest pain.  Gastrointestinal: Negative for abdominal pain, nausea and vomiting.  Genitourinary: Negative for dysuria.  Skin: Negative for rash.      Objective:    BP 123/73   Pulse 64   Wt 143 lb 4.8 oz (65 kg)   LMP 02/17/2016 (Exact Date)   BMI 25.38 kg/m  Physical Exam  Constitutional: She is oriented to person, place, and time. She appears well-developed and well-nourished. No distress.  HENT:  Head: Normocephalic and atraumatic.  Eyes: No scleral icterus.  Neck: Neck supple.  Cardiovascular: Normal rate.   Pulmonary/Chest: Effort normal.  Abdominal: Soft. She exhibits mass (20 wk size firm uterus).  Neurological: She is alert and oriented to person, place, and time.  Skin: Skin is warm and dry.  Psychiatric: She has a normal mood and affect.   Procedure: Patient given informed consent, signed copy in the chart, time out was performed. Appropriate time out taken. . The patient was placed in the lithotomy position and the cervix brought into view with sterile speculum.  Portio of cervix cleansed x 2 with betadine swabs.  A tenaculum was placed in the anterior lip of the cervix.  The uterus was sounded for depth of 10 cm. A pipelle was introduced to into the uterus,  suction created,  and an endometrial sample was obtained. All equipment was removed and accounted for.  The patient tolerated the procedure well.      Assessment & Plan:   Problem List Items Addressed This Visit      Unprioritized   Menorrhagia with irregular cycle - Primary    Megace to stop bleeding      Relevant Medications   megestrol (MEGACE) 40 MG tablet   Other Relevant Orders   TSH   CBC   Surgical pathology   US Pelvis Complete   US Transvaginal Non-OB   Pregnancy, urine POC (Completed)   Fibroid uterus    Given size and symptoms and reports of anemia-->hysterectomy may be the best option. She is undecided about fertility, although unprotected intercourse for the last 5 years and no pregnancy. Also, possible for IR and UFE but would need to not have more babies. Could consider trial of Depo-Lupron. Hysterectomy would need to be abdominal.      Iron deficiency anemia    Continue iron       Other Visit Diagnoses    Screening for cervical cancer       Relevant Orders   Cytology - PAP      Total face-to-face time with patient: 20 minutes. Over 50% of encounter was spent on counseling and coordination of care. Return in about 4 weeks (around 04/17/2016) for a follow-up.  Donnamae Jude 03/20/2016 10:17 AM

## 2016-03-20 NOTE — Assessment & Plan Note (Signed)
Megace to stop bleeding

## 2016-03-20 NOTE — Assessment & Plan Note (Signed)
Given size and symptoms and reports of anemia-->hysterectomy may be the best option. She is undecided about fertility, although unprotected intercourse for the last 5 years and no pregnancy. Also, possible for IR and UFE but would need to not have more babies. Could consider trial of Depo-Lupron. Hysterectomy would need to be abdominal.

## 2016-03-20 NOTE — Assessment & Plan Note (Signed)
Continue iron  

## 2016-03-20 NOTE — Patient Instructions (Addendum)
Uterine Fibroids Uterine fibroids are tissue masses (tumors) that can develop in the womb (uterus). They are also called leiomyomas. This type of tumor is not cancerous (benign) and does not spread to other parts of the body outside of the pelvic area, which is between the hip bones. Occasionally, fibroids may develop in the fallopian tubes, in the cervix, or on the support structures (ligaments) that surround the uterus. You can have one or many fibroids. Fibroids can vary in size, weight, and where they grow in the uterus. Some can become quite large. Most fibroids do not require medical treatment. CAUSES A fibroid can develop when a single uterine cell keeps growing (replicating). Most cells in the human body have a control mechanism that keeps them from replicating without control. SIGNS AND SYMPTOMS Symptoms may include:   Heavy bleeding during your period.  Bleeding or spotting between periods.  Pelvic pain and pressure.  Bladder problems, such as needing to urinate more often (urinary frequency) or urgently.  Inability to reproduce offspring (infertility).  Miscarriages. DIAGNOSIS Uterine fibroids are diagnosed through a physical exam. Your health care provider may feel the lumpy tumors during a pelvic exam. Ultrasonography and an MRI may be done to determine the size, location, and number of fibroids. TREATMENT Treatment may include:  Watchful waiting. This involves getting the fibroid checked by your health care provider to see if it grows or shrinks. Follow your health care provider's recommendations for how often to have this checked.  Hormone medicines. These can be taken by mouth or given through an intrauterine device (IUD).  Surgery.  Removing the fibroids (myomectomy) or the uterus (hysterectomy).  Removing blood supply to the fibroids (uterine artery embolization). If fibroids interfere with your fertility and you want to become pregnant, your health care provider  may recommend having the fibroids removed.  HOME CARE INSTRUCTIONS  Keep all follow-up visits as directed by your health care provider. This is important.  Take medicines only as directed by your health care provider.  If you were prescribed a hormone treatment, take the hormone medicines exactly as directed.  Do not take aspirin, because it can cause bleeding.  Ask your health care provider about taking iron pills and increasing the amount of dark green, leafy vegetables in your diet. These actions can help to boost your blood iron levels, which may be affected by heavy menstrual bleeding.  Pay close attention to your period and tell your health care provider about any changes, such as:  Increased blood flow that requires you to use more pads or tampons than usual per month.  A change in the number of days that your period lasts per month.  A change in symptoms that are associated with your period, such as abdominal cramping or back pain. SEEK MEDICAL CARE IF:  You have pelvic pain, back pain, or abdominal cramps that cannot be controlled with medicines.  You have an increase in bleeding between and during periods.  You soak tampons or pads in a half hour or less.  You feel lightheaded, extra tired, or weak. SEEK IMMEDIATE MEDICAL CARE IF:  You faint.  You have a sudden increase in pelvic pain.   This information is not intended to replace advice given to you by your health care provider. Make sure you discuss any questions you have with your health care provider.   Document Released: 06/05/2000 Document Revised: 06/29/2014 Document Reviewed: 12/05/2013 Elsevier Interactive Patient Education 2016 [employer] Menorrhagia Menorrhagia is the medical term for  when your menstrual periods are heavy or last longer than usual. With menorrhagia, every period you have may cause enough blood loss and cramping that you are unable to maintain your usual activities. CAUSES  In some  cases, the cause of heavy periods is unknown, but a number of conditions may cause menorrhagia. Common causes include:  A problem with the hormone-producing thyroid gland (hypothyroid).  Noncancerous growths in the uterus (polyps or fibroids).  An imbalance of the estrogen and progesterone hormones.  One of your ovaries not releasing an egg during one or more months.  Side effects of having an intrauterine device (IUD).  Side effects of some medicines, such as anti-inflammatory medicines or blood thinners.  A bleeding disorder that stops your blood from clotting normally. SIGNS AND SYMPTOMS  During a normal period, bleeding lasts between 4 and 8 days. Signs that your periods are too heavy include:  You routinely have to change your pad or tampon every 1 or 2 hours because it is completely soaked.  You pass blood clots larger than 1 inch (2.5 cm) in size.  You have bleeding for more than 7 days.  You need to use pads and tampons at the same time because of heavy bleeding.  You need to wake up to change your pads or tampons during the night.  You have symptoms of anemia, such as tiredness, fatigue, or shortness of breath. DIAGNOSIS  Your health care provider will perform a physical exam and ask you questions about your symptoms and menstrual history. Other tests may be ordered based on what the health care provider finds during the exam. These tests can include:  Blood tests. Blood tests are used to check if you are pregnant or have hormonal changes, a bleeding or thyroid disorder, low iron levels (anemia), or other problems.  Endometrial biopsy. Your health care provider takes a sample of tissue from the inside of your uterus to be examined under a microscope.  Pelvic ultrasound. This test uses sound waves to make a picture of your uterus, ovaries, and vagina. The pictures can show if you have fibroids or other growths.  Hysteroscopy. For this test, your health care provider  will use a small telescope to look inside your uterus. Based on the results of your initial tests, your health care provider may recommend further testing. TREATMENT  Treatment may not be needed. If it is needed, your health care provider may recommend treatment with one or more medicines first. If these do not reduce bleeding enough, a surgical treatment might be an option. The best treatment for you will depend on:   Whether you need to prevent pregnancy.  Your desire to have children in the future.  The cause and severity of your bleeding.  Your opinion and personal preference.  Medicines for menorrhagia may include:  Birth control methods that use hormones. These include the pill, skin patch, vaginal ring, shots that you get every 3 months, hormonal IUD, and implant. These treatments reduce bleeding during your menstrual period.  Medicines that thicken blood and slow bleeding.  Medicines that reduce swelling, such as ibuprofen.  Medicines that contain a synthetic hormone called progestin.   Medicines that make the ovaries stop working for a short time.  You may need surgical treatment for menorrhagia if the medicines are unsuccessful. Treatment options include:  Dilation and curettage (D&C). In this procedure, your health care provider opens (dilates) your cervix and then scrapes or suctions tissue from the lining of your uterus to  reduce menstrual bleeding.  Operative hysteroscopy. This procedure uses a tiny tube with a light (hysteroscope) to view your uterine cavity and can help in the surgical removal of a polyp that may be causing heavy periods.  Endometrial ablation. Through various techniques, your health care provider permanently destroys the entire lining of your uterus (endometrium). After endometrial ablation, most women have little or no menstrual flow. Endometrial ablation reduces your ability to become pregnant.  Endometrial resection. This surgical procedure  uses an electrosurgical wire loop to remove the lining of the uterus. This procedure also reduces your ability to become pregnant.  Hysterectomy. Surgical removal of the uterus and cervix is a permanent procedure that stops menstrual periods. Pregnancy is not possible after a hysterectomy. This procedure requires anesthesia and hospitalization. HOME CARE INSTRUCTIONS   Only take over-the-counter or prescription medicines as directed by your health care provider. Take prescribed medicines exactly as directed. Do not change or switch medicines without consulting your health care provider.  Take any prescribed iron pills exactly as directed by your health care provider. Long-term heavy bleeding may result in low iron levels. Iron pills help replace the iron your body lost from heavy bleeding. Iron may cause constipation. If this becomes a problem, increase the bran, fruits, and roughage in your diet.  Do not take aspirin or medicines that contain aspirin 1 week before or during your menstrual period. Aspirin may make the bleeding worse.  If you need to change your sanitary pad or tampon more than once every 2 hours, stay in bed and rest as much as possible until the bleeding stops.  Eat well-balanced meals. Eat foods high in iron. Examples are leafy green vegetables, meat, liver, eggs, and whole grain breads and cereals. Do not try to lose weight until the abnormal bleeding has stopped and your blood iron level is back to normal. SEEK MEDICAL CARE IF:   You soak through a pad or tampon every 1 or 2 hours, and this happens every time you have a period.  You need to use pads and tampons at the same time because you are bleeding so much.  You need to change your pad or tampon during the night.  You have a period that lasts for more than 8 days.  You pass clots bigger than 1 inch wide.  You have irregular periods that happen more or less often than once a month.  You feel dizzy or faint.  You  feel very weak or tired.  You feel short of breath or feel your heart is beating too fast when you exercise.  You have nausea and vomiting or diarrhea while you are taking your medicine.  You have any problems that may be related to the medicine you are taking. SEEK IMMEDIATE MEDICAL CARE IF:   You soak through 4 or more pads or tampons in 2 hours.  You have any bleeding while you are pregnant. MAKE SURE YOU:   Understand these instructions.  Will watch your condition.  Will get help right away if you are not doing well or get worse.   This information is not intended to replace advice given to you by your health care provider. Make sure you discuss any questions you have with your health care provider.   Document Released: 06/08/2005 Document Revised: 06/13/2013 Document Reviewed: 11/27/2012 Elsevier Interactive Patient Education [employer].

## 2016-03-23 LAB — CYTOLOGY - PAP

## 2016-03-26 ENCOUNTER — Ambulatory Visit (HOSPITAL_COMMUNITY)
Admission: RE | Admit: 2016-03-26 | Discharge: 2016-03-26 | Disposition: A | Discharge status: Home / Self Care | Payer: Self-pay | Source: Ambulatory Visit | Attending: Family Medicine | Admitting: Family Medicine

## 2016-03-26 DIAGNOSIS — N921 Excessive and frequent menstruation with irregular cycle: Secondary | ICD-10-CM

## 2016-03-26 DIAGNOSIS — N920 Excessive and frequent menstruation with regular cycle: Secondary | ICD-10-CM | POA: Insufficient documentation

## 2016-03-26 DIAGNOSIS — A630 Anogenital (venereal) warts: Secondary | ICD-10-CM | POA: Insufficient documentation

## 2016-03-26 DIAGNOSIS — D259 Leiomyoma of uterus, unspecified: Secondary | ICD-10-CM | POA: Insufficient documentation

## 2016-04-23 ENCOUNTER — Ambulatory Visit: Payer: Self-pay | Admitting: Family Medicine

## 2016-05-26 ENCOUNTER — Other Ambulatory Visit: Payer: Self-pay | Admitting: *Deleted

## 2016-05-26 DIAGNOSIS — N921 Excessive and frequent menstruation with irregular cycle: Secondary | ICD-10-CM

## 2016-05-26 NOTE — Telephone Encounter (Signed)
Patient called for result of ultrasound. Also asked if she needs a refill on her medication, as Dr Kennon Rounds did not give her any. Please return her call.

## 2016-05-29 MED ORDER — MEGESTROL ACETATE 40 MG PO TABS: 40.0000 mg | ORAL_TABLET | Freq: Two times a day (BID) | ORAL | 3 refills | Status: DC

## 2016-05-29 NOTE — Telephone Encounter (Signed)
I discussed refill request with Dr. Kennon Rounds and refill approved. I called [name] and left a message I was returning her call, refill sent in to pharmacy and she can call back next week to discuss her other requests.

## 2016-05-29 NOTE — Telephone Encounter (Signed)
Tanara left another message this am stating she is calling about [place] results and wants to know if Dr. Kennon Rounds wants her to continue megace- if so, needs a refill.

## 2016-05-30 ENCOUNTER — Emergency Department (HOSPITAL_BASED_OUTPATIENT_CLINIC_OR_DEPARTMENT_OTHER)
Admission: EM | Admit: 2016-05-30 | Discharge: 2016-05-31 | Disposition: A | Discharge status: Home / Self Care | Payer: Self-pay | Attending: Emergency Medicine | Admitting: Emergency Medicine

## 2016-05-30 ENCOUNTER — Emergency Department (HOSPITAL_BASED_OUTPATIENT_CLINIC_OR_DEPARTMENT_OTHER): Payer: Self-pay

## 2016-05-30 ENCOUNTER — Encounter (HOSPITAL_BASED_OUTPATIENT_CLINIC_OR_DEPARTMENT_OTHER): Payer: Self-pay | Admitting: Emergency Medicine

## 2016-05-30 DIAGNOSIS — R509 Fever, unspecified: Secondary | ICD-10-CM

## 2016-05-30 DIAGNOSIS — R1032 Left lower quadrant pain: Secondary | ICD-10-CM

## 2016-05-30 DIAGNOSIS — D259 Leiomyoma of uterus, unspecified: Secondary | ICD-10-CM | POA: Insufficient documentation

## 2016-05-30 DIAGNOSIS — N3001 Acute cystitis with hematuria: Secondary | ICD-10-CM | POA: Insufficient documentation

## 2016-05-30 DIAGNOSIS — D1800 Hemangioma unspecified site: Secondary | ICD-10-CM | POA: Insufficient documentation

## 2016-05-30 DIAGNOSIS — N898 Other specified noninflammatory disorders of vagina: Secondary | ICD-10-CM | POA: Insufficient documentation

## 2016-05-30 DIAGNOSIS — N939 Abnormal uterine and vaginal bleeding, unspecified: Secondary | ICD-10-CM

## 2016-05-30 LAB — CBC WITH DIFFERENTIAL/PLATELET
BASOS PCT: 0 %
Basophils Absolute: 0 10*3/uL (ref 0.0–0.1)
EOS ABS: 0 10*3/uL (ref 0.0–0.7)
EOS PCT: 0 %
HCT: 30.4 % — ABNORMAL LOW (ref 36.0–46.0)
HEMOGLOBIN: 10.1 g/dL — AB (ref 12.0–15.0)
LYMPHS ABS: 1.3 10*3/uL (ref 0.7–4.0)
Lymphocytes Relative: 16 %
MCH: 29.6 pg (ref 26.0–34.0)
MCHC: 33.2 g/dL (ref 30.0–36.0)
MCV: 89.1 fL (ref 78.0–100.0)
Monocytes Absolute: 1 10*3/uL (ref 0.1–1.0)
Monocytes Relative: 12 %
NEUTROS PCT: 72 %
Neutro Abs: 6.1 10*3/uL (ref 1.7–7.7)
PLATELETS: 303 10*3/uL (ref 150–400)
RBC: 3.41 MIL/uL — AB (ref 3.87–5.11)
RDW: 12.7 % (ref 11.5–15.5)
WBC: 8.4 10*3/uL (ref 4.0–10.5)

## 2016-05-30 LAB — URINALYSIS, MICROSCOPIC (REFLEX)

## 2016-05-30 LAB — COMPREHENSIVE METABOLIC PANEL
ALK PHOS: 71 U/L (ref 38–126)
ALT: 12 U/L — AB (ref 14–54)
AST: 19 U/L (ref 15–41)
Albumin: 3.8 g/dL (ref 3.5–5.0)
Anion gap: 10 (ref 5–15)
BILIRUBIN TOTAL: 0.7 mg/dL (ref 0.3–1.2)
BUN: 11 mg/dL (ref 6–20)
CALCIUM: 9.2 mg/dL (ref 8.9–10.3)
CO2: 21 mmol/L — ABNORMAL LOW (ref 22–32)
CREATININE: 0.8 mg/dL (ref 0.44–1.00)
Chloride: 104 mmol/L (ref 101–111)
Glucose, Bld: 95 mg/dL (ref 65–99)
Potassium: 3.7 mmol/L (ref 3.5–5.1)
Sodium: 135 mmol/L (ref 135–145)
TOTAL PROTEIN: 7.8 g/dL (ref 6.5–8.1)

## 2016-05-30 LAB — URINALYSIS, ROUTINE W REFLEX MICROSCOPIC
GLUCOSE, UA: NEGATIVE mg/dL
Ketones, ur: 40 mg/dL — AB
Nitrite: NEGATIVE
PROTEIN: NEGATIVE mg/dL
SPECIFIC GRAVITY, URINE: 1.025 (ref 1.005–1.030)
pH: 6 (ref 5.0–8.0)

## 2016-05-30 LAB — WET PREP, GENITAL
Clue Cells Wet Prep HPF POC: NONE SEEN
SPERM: NONE SEEN
Trich, Wet Prep: NONE SEEN
Yeast Wet Prep HPF POC: NONE SEEN

## 2016-05-30 LAB — I-STAT CG4 LACTIC ACID, ED: LACTIC ACID, VENOUS: 0.91 mmol/L (ref 0.5–1.9)

## 2016-05-30 LAB — PREGNANCY, URINE: PREG TEST UR: NEGATIVE

## 2016-05-30 MED ORDER — IOPAMIDOL (ISOVUE-300) INJECTION 61%
100.0000 mL | Freq: Once | INTRAVENOUS | Status: AC | PRN
  Administered 2016-05-30: 100 mL via INTRAVENOUS

## 2016-05-30 MED ORDER — DOXYCYCLINE HYCLATE 100 MG PO TABS
100.0000 mg | ORAL_TABLET | Freq: Once | ORAL | Status: AC
  Administered 2016-05-30: 100 mg via ORAL
  Filled 2016-05-30: qty 1

## 2016-05-30 MED ORDER — SODIUM CHLORIDE 0.9 % IV BOLUS (SEPSIS)
1000.0000 mL | Freq: Once | INTRAVENOUS | Status: AC
  Administered 2016-05-30: 1000 mL via INTRAVENOUS

## 2016-05-30 MED ORDER — ACETAMINOPHEN 500 MG PO TABS
1000.0000 mg | ORAL_TABLET | Freq: Once | ORAL | Status: AC
  Administered 2016-05-30: 1000 mg via ORAL
  Filled 2016-05-30: qty 2

## 2016-05-30 MED ORDER — DEXTROSE 5 % IV SOLN
1.0000 g | Freq: Once | INTRAVENOUS | Status: AC
  Administered 2016-05-30: 1 g via INTRAVENOUS
  Filled 2016-05-30: qty 10

## 2016-05-30 MED ORDER — ONDANSETRON HCL 4 MG/2ML IJ SOLN
4.0000 mg | Freq: Once | INTRAMUSCULAR | Status: AC
  Administered 2016-05-30: 4 mg via INTRAVENOUS
  Filled 2016-05-30: qty 2

## 2016-05-30 NOTE — ED Provider Notes (Signed)
[facility] Provider Note   CSN: [id number] Arrival date & time: 05/30/16  1926  By signing my name below, I, Julien Nordmann, attest that this documentation has been prepared under the direction and in the presence of Doristine Devoid, PA-C.  Electronically Signed: Julien Nordmann, ED Scribe. 05/30/16. 7:49 PM.    History   Chief Complaint Chief Complaint  Patient presents with  . Fever   The history is provided by the patient. No language interpreter was used.   HPI Comments: Gail Moreno is a 45 y.o. female who has a PMhx of fibroid tumors and iron deficiency anemia presents to the Emergency Department complaining of gradual worsening, cramping, left lower abdominal pain x 1 week. Pt says the pain radiates to her bilateral lower back. She has been having associated fatigue, loss of appetite, nausea, urinary urgency, urinary frequency, light-headedness, dizziness, and fever (tmax 102) x 1 day. She reports drinking a lot of soda and reports she thought as if she was having kidney problems. Pt also states she has been having a constant menstrual cycle for the past 4 weeks. She states not a heavy flow but spotting. She stopped her megestrol 4 weeks ago because she ran out of her iron pills. She had an vaginal [place] in October by her OB/GYN for fibroid and patient failed to follow up in November with ob/gyn for results. She has also had white vaginal discharge. Pt had been taking mucinex to relieve her cold symptoms without relief. Her last bowel movement was this afternoon.  Pt denies hx of ha, vision changes, cp, sob, dysuria, hematuria, diarrhea, blood in stool, melena, or hx of ovarian cysts. She states she is sexually active with one female partner and does not use protection. When asked if she is worried about std states "i wasn't but now I am."  Past Medical History:  Diagnosis Date  . Fibroid tumor     Patient Active Problem List   Diagnosis Date Noted  . Menorrhagia with  irregular cycle 03/20/2016  . Fibroid uterus 03/20/2016  . Iron deficiency anemia 03/20/2016    Past Surgical History:  Procedure Laterality Date  . BREAST LUMPECTOMY    . FOOT SURGERY    . MYRINGOTOMY WITH TUBE PLACEMENT    . SKIN GRAFT    . WISDOM TOOTH EXTRACTION      OB History    Gravida Para Term Preterm AB Living   1 1 1     1    SAB TAB Ectopic Multiple Live Births                   Home Medications    Prior to Admission medications   Medication Sig Start Date End Date Taking? Authorizing Provider  megestrol (MEGACE) 40 MG tablet Take 1 tablet (40 mg total) by mouth 2 (two) times daily. 05/29/16   Donnamae Jude, MD    Family History Family History  Problem Relation Age of Onset  . Hypertension Mother   . Hypertension Father   . Cancer Father     lungs  . Hypertension Sister   . Cancer Sister     pancreatitic  . Diabetes Maternal Aunt   . Diabetes Maternal Uncle     Social History Social History  Substance Use Topics  . Smoking status: Never Smoker  . Smokeless tobacco: Never Used  . Alcohol use Yes     Comment: occasionally     Allergies   Dilaudid [hydromorphone hcl]  Review of Systems Review of Systems  Constitutional: Positive for appetite change, fatigue and fever.  Gastrointestinal: Positive for abdominal pain and nausea. Negative for blood in stool and diarrhea.  Genitourinary: Positive for frequency and urgency.  Musculoskeletal: Positive for back pain.  Neurological: Positive for dizziness and light-headedness.  All other systems reviewed and are negative.    Physical Exam Updated Vital Signs BP 106/70 (BP Location: Right Arm)   Pulse 81   Temp 98.7 F (37.1 C) (Oral)   Resp 18   Ht 5\' 3"  (1.6 m)   Wt 64 kg   LMP 05/25/2016   SpO2 98%   BMI 24.98 kg/m   Physical Exam  Constitutional: She is oriented to person, place, and time. She appears well-developed and well-nourished.  HENT:  Head: Normocephalic and atraumatic.    Right Ear: External ear and ear canal normal.  Left Ear: External ear and ear canal normal.  Nose: Nose normal.  Mouth/Throat: Uvula is midline, oropharynx is clear and moist and mucous membranes are normal.  Eyes: Conjunctivae and EOM are normal. Pupils are equal, round, and reactive to light.  Neck: Normal range of motion. Neck supple.  Cardiovascular: Regular rhythm, normal heart sounds and intact distal pulses.  Tachycardia present.  Exam reveals no gallop and no friction rub.   No murmur heard. Pulmonary/Chest: Effort normal and breath sounds normal. No respiratory distress.  Abdominal: Soft. Bowel sounds are normal. She exhibits no distension. There is tenderness in the left lower quadrant. There is no rigidity, no rebound, no guarding, no CVA tenderness, no tenderness at McBurney's point and negative Murphy's sign.  Genitourinary: There is no rash or tenderness on the right labia. There is no rash or tenderness on the left labia. Uterus is enlarged. Uterus is not tender. Cervix exhibits no motion tenderness, no discharge and no friability. Right adnexum displays no mass, no tenderness and no fullness. Left adnexum displays no mass, no tenderness and no fullness. No erythema or bleeding in the vagina. Vaginal discharge (thick white) found.  Genitourinary Comments: Chaperon present for exam  Musculoskeletal: Normal range of motion.  Lymphadenopathy:    She has no cervical adenopathy.  Neurological: She is alert and oriented to person, place, and time.  Skin: Skin is warm and dry. Capillary refill takes less than 2 seconds.  Psychiatric: She has a normal mood and affect.  Nursing note and vitals reviewed.    ED Treatments / Results  DIAGNOSTIC STUDIES: Oxygen Saturation is 100% on RA, normal by my interpretation.  COORDINATION OF CARE:  7:43 PM Discussed treatment plan with pt at bedside and pt agreed to plan.  Labs (all labs ordered are listed, but only abnormal results are  displayed) Labs Reviewed  WET PREP, GENITAL - Abnormal; Notable for the following:       Result Value   WBC, Wet Prep HPF POC FEW (*)    All other components within normal limits  COMPREHENSIVE METABOLIC PANEL - Abnormal; Notable for the following:    CO2 21 (*)    ALT 12 (*)    All other components within normal limits  CBC WITH DIFFERENTIAL/PLATELET - Abnormal; Notable for the following:    RBC 3.41 (*)    Hemoglobin 10.1 (*)    HCT 30.4 (*)    All other components within normal limits  URINALYSIS, ROUTINE W REFLEX MICROSCOPIC - Abnormal; Notable for the following:    Color, Urine AMBER (*)    Hgb urine dipstick MODERATE (*)  Bilirubin Urine SMALL (*)    Ketones, ur 40 (*)    Leukocytes, UA TRACE (*)    All other components within normal limits  URINALYSIS, MICROSCOPIC (REFLEX) - Abnormal; Notable for the following:    Bacteria, UA FEW (*)    Squamous Epithelial / LPF 0-5 (*)    All other components within normal limits  CULTURE, BLOOD (ROUTINE X 2)  CULTURE, BLOOD (ROUTINE X 2)  URINE CULTURE  PREGNANCY, URINE  I-STAT CG4 LACTIC ACID, ED  I-STAT CG4 LACTIC ACID, ED  GC/CHLAMYDIA PROBE AMP ([place]) NOT AT [facility]    EKG  EKG Interpretation  Date/Time:  Saturday May 30 2016 20:02:09 EST Ventricular Rate:  97 PR Interval:    QRS Duration: 75 QT Interval:  323 QTC Calculation: 411 R Axis:   85 Text Interpretation:  Normal sinus rhythm no acute ST/T changes No old tracing to compare Confirmed by GOLDSTON MD, SCOTT [phone]) on 05/30/2016 8:14:38 PM Also confirmed by Regenia Skeeter MD, [place] [phone]), editor Clayton, Joelene Millin [phone])  on 05/31/2016 10:40:49 AM       Radiology Dg Chest 2 View  Result Date: 05/30/2016 CLINICAL DATA:  Lightheadedness and fevers EXAM: CHEST  2 VIEW COMPARISON:  09/30/2015 FINDINGS: Cardiac shadow is within normal limits. The lungs are clear bilaterally. Minimal scarring is noted in the left retrocardiac region stable from the prior exam. No  acute infiltrate is noted. No bony abnormality is seen. IMPRESSION: No active cardiopulmonary disease. Electronically Signed   By: Inez Catalina M.D.   On: 05/30/2016 20:29   Ct Abdomen Pelvis W Contrast  Result Date: 05/30/2016 CLINICAL DATA:  Left lower abdominal pain for 2 weeks. Fever today. Vaginal bleeding for weeks. EXAM: CT ABDOMEN AND PELVIS WITH CONTRAST TECHNIQUE: Multidetector CT imaging of the abdomen and pelvis was performed using the standard protocol following bolus administration of intravenous contrast. CONTRAST:  133mL ISOVUE-300 IOPAMIDOL (ISOVUE-300) INJECTION 61% COMPARISON:  Pelvic ultrasound 03/26/2016 FINDINGS: Lower chest: The lung bases are clear. Well-circumscribed 17 x 26 mm fluid density adjacent to the left heart border is likely a pericardial, bronchogenic, or GI duplication cyst. No surrounding inflammation. No internal heterogeneity. Hepatobiliary: There is a 10 mm enhancing focus adjacent to the gallbladder fossa. There is washout on delayed phase imaging. No additional focal hepatic lesion. Gallbladder physiologically distended, no calcified stone. No biliary dilatation. Pancreas: No ductal dilatation or inflammation. Spleen: Normal in size. Hypodense lesion in the upper spleen with lobular contours measures 17 mm. There is a small enhancing focus in the lower pole, subcentimeter. Adrenals/Urinary Tract: Normal adrenal glands. Homogeneous renal enhancement. Prominence of bilateral renal pelvis and proximal ureters, likely mass effect from uterine fibroids. No perinephric edema. There is symmetric excretion on 10 minutes delayed imaging. Small cyst in the upper right kidney. Urinary bladder is physiologically distended without wall thickening. Stomach/Bowel: Stomach is within normal limits. Appendix appears normal. No evidence of bowel wall thickening, distention, or inflammatory changes. Vascular/Lymphatic: No significant vascular findings are present. No enlarged abdominal or  pelvic lymph nodes. Reproductive: Enlarged heterogeneous fibroid uterus, the uterus spans 19 cm with the fundus above the umbilicus. Dominant fundal fibroid measures 12.2 x 14.1 x 12.8 cm, previously 11.7 cm maximal dimension on ultrasound.Additional enhancing fibroids are seen, better characterized on prior ultrasound. The ovaries are not discretely visualized. Other: Small amount of free fluid in the pelvis, likely physiologic. No free air. Musculoskeletal: No acute or significant osseous findings. IMPRESSION: 1. Uterine fibroids with marked uterine enlargement and increased  size of dominant fundal fibroid, currently 14 cm, previously 11 cm 2 months prior. There is likely mass effect on both ureters with prominence of the renal pelvis ease and proximal collecting systems. 2. Probable flash filling hemangioma adjacent the gallbladder fossa. Benign-appearing splenic lesions. 3. Small paramediastinal fluid density structure adjacent the left heart border, likely an incidental pericardial, bronchogenic, or less likely GI duplication cyst. Electronically Signed   By: Jeb Levering M.D.   On: 05/30/2016 23:19    Procedures Procedures (including critical care time)  Medications Ordered in ED Medications  sodium chloride 0.9 % bolus 1,000 mL (0 mLs Intravenous Stopped 05/30/16 2340)    And  sodium chloride 0.9 % bolus 1,000 mL (0 mLs Intravenous Stopped 05/30/16 2133)  cefTRIAXone (ROCEPHIN) 1 g in dextrose 5 % 50 mL IVPB (0 g Intravenous Stopped 05/30/16 2133)  acetaminophen (TYLENOL) tablet 1,000 mg (1,000 mg Oral Given 05/30/16 2038)  ondansetron (ZOFRAN) injection 4 mg (4 mg Intravenous Given 05/30/16 2141)  iopamidol (ISOVUE-300) 61 % injection 100 mL (100 mLs Intravenous Contrast Given 05/30/16 2244)  doxycycline (VIBRA-TABS) tablet 100 mg (100 mg Oral Given 05/30/16 2356)  ibuprofen (ADVIL,MOTRIN) tablet 600 mg (600 mg Oral Given 05/31/16 0039)     Initial Impression / Assessment and Plan / ED  Course  I have reviewed the triage vital signs and the nursing notes.  Pertinent labs & imaging results that were available during my care of the patient were reviewed by me and considered in my medical decision making (see chart for details).  Clinical Course   Patient presents to the Ed with LLQ abd pain, fever, nausea and vomiting with a hx of iron deficiency anemia uterine fibroid currently on iron and megestrol for vaginal bleeding. Patient was initially febrile at 102 and slightly tachycardic at 104 and mildly hypotensive. Concern for sepsis at that time however patient did not appear toxic or in any acute distress. Initiated sepsis workup at that time. Lactic was normal. Patient was without any leukocytosis. Bld cx and urine cultures obtained. UA showed few bacteria and wbc but not nitrites. Patient complained of urinary urgency and frequency. Started Iv rocephin and cultured urine. Urine also positive for bilirubin, ketones, and blood. Blood is likely due to patient vaginal bleeding due to fibroids and stopping her megestrol. Ketones likely due to vomiting and poor po intake. Hgb was 10.4. History of iron deficiency anemia and has not been taking her iron pills. At baseline for patient. All other labs were unremarkable. Pelvic exam was performed that showed white discharge however no CMT or adnexal tenderness. GC/chlym culture sent. No adnexal tenderness no concern for torsion. Neg urine pregnancy. Given patient LLQ pain ordered CT abd pelvis. Findings revealed enlarging fibroid with likely mass effect on both ureters with prominence of the renal pelvis ease and proximal collecting systems. Pain likely due to enlarging fibroid which patient is being followed by ob/gyn. Other incidental findings noted and patient informed. No concerning signs for pyelo or cystitis. No CVA tenderness or urinary symptoms. Low suspicion for pyelo or uti. Given patient vaginal discharge will treat for PID. Urine is not very  concerning for UTI but sent culture. She was covered with ctrx in Ed and will sent home in doxy with culture pending. Patient is not septic. She is not toxic appearing and feels much improved. She was given 2 l of fluids. BP have been stable. She has history of low bp. Fever improved with tylenol. Tachy improved with treatment of  fever and fluids. I have given patient pain meds and nausea meds. I have encouraged to call ob/gyn on Monday to follow up on fibroid. I have encouraged patient to continue to take her megestrol and iron. She needs to follow up with her PCP this week for urine and hgb recheck and close follow up. Patient was dicussed with Dr. Regenia Skeeter who is agreeable to the above plan and feels that I need to treat her with PID and have close follow up . Patient bp have been stable. Pt is hemodynamically stable, in NAD, & able to ambulate in the ED. Pain has been managed & has no complaints prior to dc. Pt is comfortable with above plan and is stable for discharge at this time. All questions were answered prior to disposition. Strict return precautions for f/u to the ED were discussed.  I personally performed the services described in this documentation, which was scribed in my presence. The recorded information has been reviewed and is accurate.  Final Clinical Impressions(s) / ED Diagnoses   Final diagnoses:  Fever, unspecified fever cause  LLQ pain  Uterine leiomyoma, unspecified location  Hemangioma  Vaginal bleeding  Acute cystitis with hematuria  Vaginal discharge    New Prescriptions Discharge Medication List as of 05/31/2016 12:30 AM    START taking these medications   Details  cephALEXin (KEFLEX) 500 MG capsule Take 1 capsule (500 mg total) by mouth 2 (two) times daily., Starting Sun 05/31/2016, Print    doxycycline (VIBRAMYCIN) 100 MG capsule Take 1 capsule (100 mg total) by mouth 2 (two) times daily., Starting Sun 05/31/2016, Print    HYDROcodone-acetaminophen  (NORCO/VICODIN) 5-325 MG tablet Take 2 tablets by mouth every 4 (four) hours as needed., Starting Sun 05/31/2016, Print    ondansetron (ZOFRAN) 4 MG tablet Take 1 tablet (4 mg total) by mouth every 6 (six) hours., Starting Sun 05/31/2016, Print         Doristine Devoid, PA-C 06/01/16 0017    Sherwood Gambler, MD 06/02/16 1235

## 2016-05-30 NOTE — ED Triage Notes (Signed)
Patient reports that she is having lower left sided abdominal area pain x 2 weeks. Reports fever today

## 2016-05-31 MED ORDER — DOXYCYCLINE HYCLATE 100 MG PO CAPS: 100.0000 mg | ORAL_CAPSULE | Freq: Two times a day (BID) | ORAL | 0 refills | Status: DC

## 2016-05-31 MED ORDER — CEPHALEXIN 500 MG PO CAPS: 500.0000 mg | ORAL_CAPSULE | Freq: Two times a day (BID) | ORAL | 0 refills | Status: DC

## 2016-05-31 MED ORDER — ONDANSETRON HCL 4 MG PO TABS: 4.0000 mg | ORAL_TABLET | Freq: Four times a day (QID) | ORAL | 0 refills | Status: DC

## 2016-05-31 MED ORDER — IBUPROFEN 200 MG PO TABS
600.0000 mg | ORAL_TABLET | Freq: Once | ORAL | Status: AC
  Administered 2016-05-31: 01:00:00 600 mg via ORAL
  Filled 2016-05-31: qty 1

## 2016-05-31 MED ORDER — HYDROCODONE-ACETAMINOPHEN 5-325 MG PO TABS: 2.0000 | ORAL_TABLET | ORAL | 0 refills | Status: DC | PRN

## 2016-05-31 NOTE — Discharge Instructions (Signed)
Please take both antibiotics as prescribed. Take the zofran as needed for nausea. Please take motrin and tylenol as needed for pain. The Norco has tylenol in it so be careful to not take more than prescribed. Please follow up with your ob/gyn on Monday concerning the fibroid. Please start back taking your iron pills and hormone pill. You also need to follow up with your primary care doctor on Monday for urine recheck, hemoglobin recheck, and follow up concerning the other ct findings that we discussed. I have given you a report of your ct scan. Please return to the ED if your symptoms worsen or for any other reason.

## 2016-06-01 LAB — GC/CHLAMYDIA PROBE AMP (~~LOC~~) NOT AT ARMC
Chlamydia: NEGATIVE
NEISSERIA GONORRHEA: NEGATIVE

## 2016-06-01 LAB — URINE CULTURE

## 2016-06-03 ENCOUNTER — Ambulatory Visit (INDEPENDENT_AMBULATORY_CARE_PROVIDER_SITE_OTHER): Payer: Self-pay | Admitting: Obstetrics & Gynecology

## 2016-06-03 ENCOUNTER — Encounter: Payer: Self-pay | Admitting: Family Medicine

## 2016-06-03 ENCOUNTER — Encounter: Payer: Self-pay | Admitting: Obstetrics & Gynecology

## 2016-06-03 VITALS — BP 115/69 | HR 102 | Wt 147.7 lb

## 2016-06-03 DIAGNOSIS — D259 Leiomyoma of uterus, unspecified: Secondary | ICD-10-CM

## 2016-06-03 DIAGNOSIS — R102 Pelvic and perineal pain: Secondary | ICD-10-CM

## 2016-06-03 NOTE — Patient Instructions (Signed)

## 2016-06-03 NOTE — Progress Notes (Signed)
History:  45 y.o. G1P1001 here today for AUB thought due to uterine fibroids.  Has not had bleeding from 11/11-12/9.  Went to the ED due to pain on 12/9 the bleeding was intermittently heavy and ligth but the pain was intense. Pt had stopped taking Megace at the tmie.   The following portions of the patient's history were reviewed and updated as appropriate: allergies, current medications, past family history, past medical history, past social history, past surgical history and problem list.  Review of Systems:  Pertinent items are noted in HPI.   Objective:  Physical Exam Blood pressure 115/69, pulse (!) 102, weight 147 lb 11.2 oz (67 kg), last menstrual period 05/25/2016. Gen: NAD Abd: Soft, nontender and nondistended; large mass felt to above the umbilicus. Pelvic: Normal appearing external genitalia; normal appearing vaginal mucosa and cervix.  Normal discharge.  Enlarged uterus with wide base, no other palpable masses, no uterine or adnexal tenderness. difficult to assess for adnexal masses due to mass effect.   CBC Latest Ref Rng & Units 05/30/2016 03/20/2016 06/05/2015  WBC 4.0 - 10.5 K/uL 8.4 3.2(L) 4.0  Hemoglobin 12.0 - 15.0 g/dL 10.1(L) 12.1 9.5(L)  Hematocrit 36.0 - 46.0 % 30.4(L) 35.8 29.9(L)  Platelets 150 - 400 K/uL 303 261 270     Labs and Imaging Dg Chest 2 View  Result Date: 05/30/2016 CLINICAL DATA:  Lightheadedness and fevers EXAM: CHEST  2 VIEW COMPARISON:  09/30/2015 FINDINGS: Cardiac shadow is within normal limits. The lungs are clear bilaterally. Minimal scarring is noted in the left retrocardiac region stable from the prior exam. No acute infiltrate is noted. No bony abnormality is seen. IMPRESSION: No active cardiopulmonary disease. Electronically Signed   By: Inez Catalina M.D.   On: 05/30/2016 20:29   Ct Abdomen Pelvis W Contrast  Result Date: 05/30/2016 CLINICAL DATA:  Left lower abdominal pain for 2 weeks. Fever today. Vaginal bleeding for weeks. EXAM: CT  ABDOMEN AND PELVIS WITH CONTRAST TECHNIQUE: Multidetector CT imaging of the abdomen and pelvis was performed using the standard protocol following bolus administration of intravenous contrast. CONTRAST:  110mL ISOVUE-300 IOPAMIDOL (ISOVUE-300) INJECTION 61% COMPARISON:  Pelvic ultrasound 03/26/2016 FINDINGS: Lower chest: The lung bases are clear. Well-circumscribed 17 x 26 mm fluid density adjacent to the left heart border is likely a pericardial, bronchogenic, or GI duplication cyst. No surrounding inflammation. No internal heterogeneity. Hepatobiliary: There is a 10 mm enhancing focus adjacent to the gallbladder fossa. There is washout on delayed phase imaging. No additional focal hepatic lesion. Gallbladder physiologically distended, no calcified stone. No biliary dilatation. Pancreas: No ductal dilatation or inflammation. Spleen: Normal in size. Hypodense lesion in the upper spleen with lobular contours measures 17 mm. There is a small enhancing focus in the lower pole, subcentimeter. Adrenals/Urinary Tract: Normal adrenal glands. Homogeneous renal enhancement. Prominence of bilateral renal pelvis and proximal ureters, likely mass effect from uterine fibroids. No perinephric edema. There is symmetric excretion on 10 minutes delayed imaging. Small cyst in the upper right kidney. Urinary bladder is physiologically distended without wall thickening. Stomach/Bowel: Stomach is within normal limits. Appendix appears normal. No evidence of bowel wall thickening, distention, or inflammatory changes. Vascular/Lymphatic: No significant vascular findings are present. No enlarged abdominal or pelvic lymph nodes. Reproductive: Enlarged heterogeneous fibroid uterus, the uterus spans 19 cm with the fundus above the umbilicus. Dominant fundal fibroid measures 12.2 x 14.1 x 12.8 cm, previously 11.7 cm maximal dimension on ultrasound.Additional enhancing fibroids are seen, better characterized on prior ultrasound. The ovaries  are  not discretely visualized. Other: Small amount of free fluid in the pelvis, likely physiologic. No free air. Musculoskeletal: No acute or significant osseous findings. IMPRESSION: 1. Uterine fibroids with marked uterine enlargement and increased size of dominant fundal fibroid, currently 14 cm, previously 11 cm 2 months prior. There is likely mass effect on both ureters with prominence of the renal pelvis ease and proximal collecting systems. 2. Probable flash filling hemangioma adjacent the gallbladder fossa. Benign-appearing splenic lesions. 3. Small paramediastinal fluid density structure adjacent the left heart border, likely an incidental pericardial, bronchogenic, or less likely GI duplication cyst. Electronically Signed   By: Jeb Levering M.D.   On: 05/30/2016 23:19   03/26/2016 CLINICAL DATA:  Menorrhagia. Irregular cycle. Known fibroids. Patient takes Megace. HPV. LMP 03/20/2016.  EXAM: TRANSABDOMINAL AND TRANSVAGINAL ULTRASOUND OF PELVIS  TECHNIQUE: Both transabdominal and transvaginal ultrasound examinations of the pelvis were performed. Transabdominal technique was performed for global imaging of the pelvis including uterus, ovaries, adnexal regions, and pelvic cul-de-sac. It was necessary to proceed with endovaginal exam following the transabdominal exam to visualize the right ovary.  COMPARISON:  12/11/2011  FINDINGS: Uterus  Measurements: At least and probably greater than 18.6 x 10.7 x 13.8 cm. Multiple fibroids are present. Measured fibroids are:  Anterior fibroid is 3.5 x 2.5 x 3.4 cm. Left anterior fibroid is 3.7 x 3.4 x 3.5 cm. Largest fundal fibroid is 11.7 x 11.0 x 10.4 cm.  Endometrium  Thickness: 4.1 mm. Partially obscured by multiple fibroids. No focal abnormality identified.  Right ovary  Measurements: 3.2 x 1.7 x 1.5 cm. Normal appearance/no adnexal mass.  Left ovary  Measurements: 4.5 x 1.6 x 2.7 cm. Normal appearance/no adnexal  mass.  Other findings  No abnormal free fluid.  IMPRESSION: 1. Numerous fibroids. Largest now measures 11.7 cm and previously measured 10.0 cm. 2. Visualized portion the endometrium is normal in appearance. 3. Normal appearance of both ovaries.   03/20/2016 Diagnosis Endometrium, biopsy - SECRETORY ENDOMETRIUM. NO HYPERPLASIA OR MALIGNANCY Assessment & Plan:  Large uterine fibroids  Megace 40mg  bid just started on Monday. Pt declines surgery at this time. I have also reviewed with her [place] which she also declines.  She would like to decide and f/u at the first of the new year.  Total face-to-face time with patient was 20 min.  Greater than 50% was spent in counseling and coordination of care with the patient. We discussed management of uterine fibroids.     Treydon Henricks L. Harraway-Smith, M.D., Cherlynn June

## 2016-06-04 LAB — CULTURE, BLOOD (ROUTINE X 2)
Culture: NO GROWTH
Culture: NO GROWTH

## 2016-06-08 ENCOUNTER — Encounter: Payer: Self-pay | Admitting: Obstetrics & Gynecology

## 2016-06-18 ENCOUNTER — Encounter: Payer: Self-pay | Admitting: Family Medicine

## 2016-07-22 ENCOUNTER — Telehealth: Payer: Self-pay | Admitting: *Deleted

## 2016-07-22 NOTE — Telephone Encounter (Signed)
Pt left message yesterday stating that she has a fibroid and has been having bleeding. It usually lasts about 3 weeks but this time she has been bleeding since 1/3. The bleeding goes from heavy to light and sometimes she has clots. Please call back.

## 2016-07-23 NOTE — Telephone Encounter (Signed)
LM for pt that the Dr. Kennon Rounds recommended that she increase the dosage of her Megace 40 mg tablet from bid to tid until her bleeding stops.  Once the bleeding stops provider wants her go back to taking the medication bid.  If she has any questions to please give [place] a call back.

## 2016-07-23 NOTE — Telephone Encounter (Signed)
Called pt and pt verified that she taking Megace 40 mg po bid and is still having irregular bleeding.  Pt states that she has one episode where she had a quarter size clot but no other time.  Looking in pt's chart pt was advised to take Depo Provera for management.  Pt still does not want to use Depo Provera for management because she wants to get pregnant even though she is the age she is. I explained to the pt that I will send Dr. Kennon Rounds a message since she is the original provider who saw and if the pt could give [place] a couple days for a return call back.  Pt stated "yes, that is fine".   Message sent to Dr.Pratt.

## 2016-10-27 ENCOUNTER — Telehealth: Payer: Self-pay | Admitting: *Deleted

## 2016-10-27 NOTE — Telephone Encounter (Addendum)
Pt left message stating that she will be out of her Megace on Thursday 5/10 with no remaining refills. She has been taking the medication twice daily.  Message sent to Dr. Ihor Dow for refill order. Pt was advised that we will call her back once a response has been received. Pt also stated that she needs a follow up appt with the doctor and will call back to schedule the appt. She voiced understanding.   5/10  1555  Called pt and notified her that a refill prescription has been sent to the pharmacy.  She voiced understanding.

## 2016-10-28 ENCOUNTER — Other Ambulatory Visit: Payer: Self-pay | Admitting: Obstetrics & Gynecology

## 2016-10-28 DIAGNOSIS — N921 Excessive and frequent menstruation with irregular cycle: Secondary | ICD-10-CM

## 2016-10-28 MED ORDER — MEGESTROL ACETATE 40 MG PO TABS: 40.0000 mg | ORAL_TABLET | Freq: Two times a day (BID) | ORAL | 3 refills | Status: DC

## 2016-11-09 ENCOUNTER — Ambulatory Visit (INDEPENDENT_AMBULATORY_CARE_PROVIDER_SITE_OTHER): Payer: Self-pay | Admitting: Obstetrics & Gynecology

## 2016-11-09 VITALS — BP 131/73 | HR 86 | Wt 144.2 lb

## 2016-11-09 DIAGNOSIS — D250 Submucous leiomyoma of uterus: Secondary | ICD-10-CM

## 2016-11-09 DIAGNOSIS — D251 Intramural leiomyoma of uterus: Secondary | ICD-10-CM

## 2016-11-09 DIAGNOSIS — N939 Abnormal uterine and vaginal bleeding, unspecified: Secondary | ICD-10-CM

## 2016-11-09 DIAGNOSIS — D252 Subserosal leiomyoma of uterus: Secondary | ICD-10-CM

## 2016-11-09 DIAGNOSIS — N921 Excessive and frequent menstruation with irregular cycle: Secondary | ICD-10-CM

## 2016-11-09 MED ORDER — MEGESTROL ACETATE 40 MG PO TABS: 40.0000 mg | ORAL_TABLET | Freq: Two times a day (BID) | ORAL | 6 refills | Status: DC

## 2016-11-09 NOTE — Patient Instructions (Signed)
Perimenopause Perimenopause is the time when your body begins to move into the menopause (no menstrual period for 12 straight months). It is a natural process. Perimenopause can begin 2-8 years before the menopause and usually lasts for 1 year after the menopause. During this time, your ovaries may or may not produce an egg. The ovaries vary in their production of estrogen and progesterone hormones each month. This can cause irregular menstrual periods, difficulty getting pregnant, vaginal bleeding between periods, and uncomfortable symptoms. What are the causes?  Irregular production of the ovarian hormones, estrogen and progesterone, and not ovulating every month. Other causes include:  Tumor of the pituitary gland in the brain.  Medical disease that affects the ovaries.  Radiation treatment.  Chemotherapy.  Unknown causes.  Heavy smoking and excessive alcohol intake can bring on perimenopause sooner. What are the signs or symptoms?  Hot flashes.  Night sweats.  Irregular menstrual periods.  Decreased sex drive.  Vaginal dryness.  Headaches.  Mood swings.  Depression.  Memory problems.  Irritability.  Tiredness.  Weight gain.  Trouble getting pregnant.  The beginning of losing bone cells (osteoporosis).  The beginning of hardening of the arteries (atherosclerosis). How is this diagnosed? Your health care provider will make a diagnosis by analyzing your age, menstrual history, and symptoms. He or she will do a physical exam and note any changes in your body, especially your female organs. Female hormone tests may or may not be helpful depending on the amount of female hormones you produce and when you produce them. However, other hormone tests may be helpful to rule out other problems. How is this treated? In some cases, no treatment is needed. The decision on whether treatment is necessary during the perimenopause should be made by you and your health care  provider based on how the symptoms are affecting you and your lifestyle. Various treatments are available, such as:  Treating individual symptoms with a specific medicine for that symptom.  Herbal medicines that can help specific symptoms.  Counseling.  Group therapy. Follow these instructions at home:  Keep track of your menstrual periods (when they occur, how heavy they are, how long between periods, and how long they last) as well as your symptoms and when they started.  Only take over-the-counter or prescription medicines as directed by your health care provider.  Sleep and rest.  Exercise.  Eat a diet that contains calcium (good for your bones) and soy (acts like the estrogen hormone).  Do not smoke.  Avoid alcoholic beverages.  Take vitamin supplements as recommended by your health care provider. Taking vitamin E may help in certain cases.  Take calcium and vitamin D supplements to help prevent bone loss.  Group therapy is sometimes helpful.  Acupuncture may help in some cases. Contact a health care provider if:  You have questions about any symptoms you are having.  You need a referral to a specialist (gynecologist, psychiatrist, or psychologist). Get help right away if:  You have vaginal bleeding.  Your period lasts longer than 8 days.  Your periods are recurring sooner than 21 days.  You have bleeding after intercourse.  You have severe depression.  You have pain when you urinate.  You have severe headaches.  You have vision problems. This information is not intended to replace advice given to you by your health care provider. Make sure you discuss any questions you have with your health care provider. Document Released: 07/16/2004 Document Revised: 11/14/2015 Document Reviewed: 01/05/2013 Elsevier Interactive   Patient Education  2017 Elsevier Inc.  

## 2016-11-09 NOTE — Progress Notes (Signed)
History:  46 y.o. G1P1001 here today for eval of uterine fibroids. Pt is on Megace 40mg  bid. Pt reports bleeding that is light when it occurs. Pt reports very light cycles. In Jan 3-Feb 7 daily spotting,   Mar 19-  Apr 1 and then May 5-8 and 11-12.  Bleeding pattern does not bother pt. The bleeding is panty liner bleeding.   Pt feels like it is slowing down. Pt still interested in conceiving.  Pt reports having hot flashes regularly. Pt does not want to stop meds to conceive as she is having issues with her SO. She is aware that she will likely not get pregnant on Megace.  The following portions of the patient's history were reviewed and updated as appropriate: allergies, current medications, past family history, past medical history, past social history, past surgical history and problem list.  Review of Systems:  Pertinent items are noted in HPI.   Objective:  Physical Exam BP 131/73   Pulse 86   Wt 144 lb 3.2 oz (65.4 kg)   LMP 10/30/2016   BMI 25.54 kg/m  CONSTITUTIONAL: Well-developed, well-nourished female in no acute distress.  HENT:  Normocephalic, atraumatic EYES: Conjunctivae and EOM are normal. No scleral icterus.  NECK: Normal range of motion SKIN: Skin is warm and dry. No rash noted. Not diaphoretic.No pallor. [place]: Alert and oriented to person, place, and time. Normal coordination. \ GYN exam deferred  Labs and Imaging 03/26/2016 CLINICAL DATA:  Menorrhagia. Irregular cycle. Known fibroids. Patient takes Megace. HPV. LMP 03/20/2016.  EXAM: TRANSABDOMINAL AND TRANSVAGINAL ULTRASOUND OF PELVIS  TECHNIQUE: Both transabdominal and transvaginal ultrasound examinations of the pelvis were performed. Transabdominal technique was performed for global imaging of the pelvis including uterus, ovaries, adnexal regions, and pelvic cul-de-sac. It was necessary to proceed with endovaginal exam following the transabdominal exam to visualize the right ovary.  COMPARISON:   12/11/2011  FINDINGS: Uterus  Measurements: At least and probably greater than 18.6 x 10.7 x 13.8 cm. Multiple fibroids are present. Measured fibroids are:  Anterior fibroid is 3.5 x 2.5 x 3.4 cm. Left anterior fibroid is 3.7 x 3.4 x 3.5 cm. Largest fundal fibroid is 11.7 x 11.0 x 10.4 cm.  Endometrium  Thickness: 4.1 mm. Partially obscured by multiple fibroids. No focal abnormality identified.  Right ovary  Measurements: 3.2 x 1.7 x 1.5 cm. Normal appearance/no adnexal mass.  Left ovary  Measurements: 4.5 x 1.6 x 2.7 cm. Normal appearance/no adnexal mass.  Other findings  No abnormal free fluid.  IMPRESSION: 1. Numerous fibroids. Largest now measures 11.7 cm and previously measured 10.0 cm. 2. Visualized portion the endometrium is normal in appearance. 3. Normal appearance of both ovaries.  02/2016 ATYPICAL SQUAMOUS CELLS OF UNDETERMINED SIGNIFICANCE (ASC-US). FUNGAL ORGANISMS PRESENT CONSISTENT WITH CANDIDA SPP. [name] MD Pathologist, Electronic Signature (Case signed 03/24/2016) Source CervicoVaginal Pap [ThinPrep Imaged] Molecular Results Test Result HPV High Risk NOT DETECTED Assessment & Plan:  uterine fibroids causing AUB. Thin endometrial stripe noted on prev [place].  Bleeding pattern is not bothersome to pt.  Pt does not want the dose of Megace adjusted.  Megace 40mg  bid F/u in 6 months or sooner prn  Total face-to-face time with patient was 15 min.  Greater than 50% was spent in counseling and coordination of care with the patient.  Eason Housman L. Harraway-Smith, M.D., Cherlynn June

## 2016-11-17 ENCOUNTER — Telehealth: Payer: Self-pay | Admitting: Obstetrics & Gynecology

## 2016-11-17 NOTE — Telephone Encounter (Signed)
Patient has requested her Rx be sent to [facility] on [address] [place].

## 2016-11-19 NOTE — Telephone Encounter (Signed)
I called Gail Moreno back and she is requesting for future prescriptions to change her pharmacy to [facility] in [place] on [address], which I did.

## 2017-04-07 ENCOUNTER — Telehealth: Payer: Self-pay | Admitting: Obstetrics & Gynecology

## 2017-04-07 NOTE — Telephone Encounter (Signed)
patient calling to get a order for a mammogram

## 2017-04-20 ENCOUNTER — Ambulatory Visit: Payer: Self-pay | Admitting: Obstetrics and Gynecology

## 2017-04-21 ENCOUNTER — Ambulatory Visit: Payer: Self-pay | Admitting: Obstetrics & Gynecology

## 2017-04-22 ENCOUNTER — Ambulatory Visit (INDEPENDENT_AMBULATORY_CARE_PROVIDER_SITE_OTHER): Payer: Self-pay | Admitting: Obstetrics & Gynecology

## 2017-04-22 ENCOUNTER — Encounter: Payer: Self-pay | Admitting: Obstetrics & Gynecology

## 2017-04-22 VITALS — BP 119/79 | HR 69 | Wt 152.1 lb

## 2017-04-22 DIAGNOSIS — D219 Benign neoplasm of connective and other soft tissue, unspecified: Secondary | ICD-10-CM

## 2017-04-22 DIAGNOSIS — N898 Other specified noninflammatory disorders of vagina: Secondary | ICD-10-CM

## 2017-04-22 DIAGNOSIS — Z113 Encounter for screening for infections with a predominantly sexual mode of transmission: Secondary | ICD-10-CM

## 2017-04-22 DIAGNOSIS — D251 Intramural leiomyoma of uterus: Secondary | ICD-10-CM

## 2017-04-22 DIAGNOSIS — D252 Subserosal leiomyoma of uterus: Secondary | ICD-10-CM

## 2017-04-22 DIAGNOSIS — N921 Excessive and frequent menstruation with irregular cycle: Secondary | ICD-10-CM

## 2017-04-22 DIAGNOSIS — T192XXA Foreign body in vulva and vagina, initial encounter: Secondary | ICD-10-CM

## 2017-04-22 DIAGNOSIS — D250 Submucous leiomyoma of uterus: Secondary | ICD-10-CM

## 2017-04-22 DIAGNOSIS — N939 Abnormal uterine and vaginal bleeding, unspecified: Secondary | ICD-10-CM

## 2017-04-22 MED ORDER — MEGESTROL ACETATE 40 MG PO TABS: 40.0000 mg | ORAL_TABLET | Freq: Two times a day (BID) | ORAL | 6 refills | Status: DC

## 2017-04-22 NOTE — Progress Notes (Signed)
History:  46 y.o. G1P1001 here today for eval of bleeding after noting a retained tampon. She thinks it was there about 3-4 weeks. Pt was sexually active while retained tampon was in . She initially thought it was a mass in the vagina. She reports bleeding since the  tampon was removed.   The tampon fell out on the 28th and now the bleeding is less than prev. Same partner for 6 years.  Pt is not   worried about STIs. She is worried about toxic shock syndrome. She denies F/C.   The following portions of the patient's history were reviewed and updated as appropriate: allergies, current medications, past family history, past medical history, past social history, past surgical history and problem list.  Review of Systems:  Pertinent items are noted in HPI.   Objective:  Physical Exam Blood pressure 119/79, pulse 69, weight 152 lb 2 oz (69 kg).  CONSTITUTIONAL: Well-developed, well-nourished female in no acute distress.  HENT:  Normocephalic, atraumatic EYES: Conjunctivae and EOM are normal. No scleral icterus.  NECK: Normal range of motion SKIN: Skin is warm and dry. No rash noted. Not diaphoretic.No pallor. [place]: Alert and oriented to person, place, and time. Normal coordination.   Abd: Soft, nontender and nondistended Pelvic: Normal appearing external genitalia; normal appearing vaginal mucosa and cervix.  Normal discharge. No blood in vault. Small uterus, no other palpable masses, no uterine or adnexal tenderness  Labs and Imaging 03/26/2016 CLINICAL DATA:  Menorrhagia. Irregular cycle. Known fibroids. Patient takes Megace. HPV. LMP 03/20/2016.  EXAM: TRANSABDOMINAL AND TRANSVAGINAL ULTRASOUND OF PELVIS  TECHNIQUE: Both transabdominal and transvaginal ultrasound examinations of the pelvis were performed. Transabdominal technique was performed for global imaging of the pelvis including uterus, ovaries, adnexal regions, and pelvic cul-de-sac. It was necessary to proceed  with endovaginal exam following the transabdominal exam to visualize the right ovary.  COMPARISON:  12/11/2011  FINDINGS: Uterus  Measurements: At least and probably greater than 18.6 x 10.7 x 13.8 cm. Multiple fibroids are present. Measured fibroids are:  Anterior fibroid is 3.5 x 2.5 x 3.4 cm. Left anterior fibroid is 3.7 x 3.4 x 3.5 cm. Largest fundal fibroid is 11.7 x 11.0 x 10.4 cm.  Endometrium  Thickness: 4.1 mm. Partially obscured by multiple fibroids. No focal abnormality identified.  Right ovary  Measurements: 3.2 x 1.7 x 1.5 cm. Normal appearance/no adnexal mass.  Left ovary  Measurements: 4.5 x 1.6 x 2.7 cm. Normal appearance/no adnexal mass.  Other findings  No abnormal free fluid.  IMPRESSION: 1. Numerous fibroids. Largest now measures 11.7 cm and previously measured 10.0 cm. 2. Visualized portion the endometrium is normal in appearance. 3. Normal appearance of both ovaries.  03/20/2016 Diagnosis Endometrium, biopsy - SECRETORY ENDOMETRIUM. NO HYPERPLASIA OR MALIGNANCY.  Assessment & Plan:  AUB  Reviwed treatment optons  Megace 40mg  po bid   F/u in 3 months   Pt will complete financial aid paperwork   Total face-to-face time with patient was 47min.  Greater than 50% was spent in counseling and coordination of care with the patient.

## 2017-04-22 NOTE — Patient Instructions (Signed)

## 2017-04-23 LAB — CERVICOVAGINAL ANCILLARY ONLY
Bacterial vaginitis: POSITIVE — AB
CANDIDA VAGINITIS: NEGATIVE
CHLAMYDIA, DNA PROBE: NEGATIVE
Neisseria Gonorrhea: NEGATIVE
Trichomonas: NEGATIVE

## 2017-04-26 ENCOUNTER — Other Ambulatory Visit: Payer: Self-pay | Admitting: Obstetrics & Gynecology

## 2017-04-26 ENCOUNTER — Encounter: Payer: Self-pay | Admitting: Obstetrics & Gynecology

## 2017-04-26 DIAGNOSIS — Z1231 Encounter for screening mammogram for malignant neoplasm of breast: Secondary | ICD-10-CM

## 2017-04-29 ENCOUNTER — Encounter: Payer: Self-pay | Admitting: Obstetrics & Gynecology

## 2017-04-29 NOTE — Telephone Encounter (Signed)
Patient had appointment for mammogram on 04/26/17 and no showed the appointment.

## 2017-04-30 ENCOUNTER — Other Ambulatory Visit: Payer: Self-pay | Admitting: General Practice

## 2017-04-30 DIAGNOSIS — N760 Acute vaginitis: Principal | ICD-10-CM

## 2017-04-30 DIAGNOSIS — B9689 Other specified bacterial agents as the cause of diseases classified elsewhere: Secondary | ICD-10-CM

## 2017-04-30 MED ORDER — METRONIDAZOLE 500 MG PO TABS
500.0000 mg | ORAL_TABLET | Freq: Two times a day (BID) | ORAL | 0 refills | Status: DC
Start: 2017-04-30 — End: 2017-06-01

## 2017-05-20 ENCOUNTER — Ambulatory Visit
Admission: RE | Admit: 2017-05-20 | Discharge: 2017-05-20 | Disposition: A | Discharge status: Home / Self Care | Payer: No Typology Code available for payment source | Source: Ambulatory Visit | Attending: Obstetrics & Gynecology | Admitting: Obstetrics & Gynecology

## 2017-05-20 DIAGNOSIS — Z1231 Encounter for screening mammogram for malignant neoplasm of breast: Secondary | ICD-10-CM

## 2017-06-01 ENCOUNTER — Emergency Department (HOSPITAL_BASED_OUTPATIENT_CLINIC_OR_DEPARTMENT_OTHER)
Admission: EM | Admit: 2017-06-01 | Discharge: 2017-06-02 | Disposition: A | Discharge status: Home / Self Care | Payer: No Typology Code available for payment source | Attending: Emergency Medicine | Admitting: Emergency Medicine

## 2017-06-01 ENCOUNTER — Encounter (HOSPITAL_BASED_OUTPATIENT_CLINIC_OR_DEPARTMENT_OTHER): Payer: Self-pay

## 2017-06-01 ENCOUNTER — Other Ambulatory Visit: Payer: Self-pay

## 2017-06-01 ENCOUNTER — Emergency Department (HOSPITAL_BASED_OUTPATIENT_CLINIC_OR_DEPARTMENT_OTHER): Payer: No Typology Code available for payment source

## 2017-06-01 DIAGNOSIS — Z79899 Other long term (current) drug therapy: Secondary | ICD-10-CM | POA: Insufficient documentation

## 2017-06-01 DIAGNOSIS — B9789 Other viral agents as the cause of diseases classified elsewhere: Secondary | ICD-10-CM | POA: Insufficient documentation

## 2017-06-01 DIAGNOSIS — R059 Cough, unspecified: Secondary | ICD-10-CM

## 2017-06-01 DIAGNOSIS — J069 Acute upper respiratory infection, unspecified: Secondary | ICD-10-CM | POA: Insufficient documentation

## 2017-06-01 DIAGNOSIS — R05 Cough: Secondary | ICD-10-CM

## 2017-06-01 MED ORDER — BENZONATATE 100 MG PO CAPS: 100.0000 mg | ORAL_CAPSULE | Freq: Three times a day (TID) | ORAL | 0 refills | Status: DC

## 2017-06-01 MED ORDER — BENZONATATE 100 MG PO CAPS
200.0000 mg | ORAL_CAPSULE | Freq: Once | ORAL | Status: AC
  Administered 2017-06-02: 200 mg via ORAL
  Filled 2017-06-01: qty 2

## 2017-06-01 NOTE — ED Notes (Signed)
ED Provider at bedside. 

## 2017-06-01 NOTE — ED Triage Notes (Signed)
C/o flu like sx x 2 days-NAD-steady gait 

## 2017-06-01 NOTE — ED Provider Notes (Signed)
[facility]PART[facility] Provider Note   CSN: [id number] Arrival date & time: 06/01/17  2114     History   Chief Complaint Chief Complaint  Patient presents with  . Cough    HPI Gail Moreno is a 46 y.o. female.  Patient working in dusty environment several days ago, developed cough two days ago. Endorses itching, watering eyes, runny nose, sore throat. Denies abdominal pain, nausea, vomiting.     Cough  This is a new problem. The current episode started 2 days ago. The problem has been gradually worsening. The cough is productive of sputum. There has been no fever. Associated symptoms include rhinorrhea, sore throat and eye redness. She has tried decongestants for the symptoms. The treatment provided no relief. She is not a smoker. Her past medical history is significant for pneumonia.    Past Medical History:  Diagnosis Date  . Fibroid tumor     Patient Active Problem List   Diagnosis Date Noted  . Menorrhagia with irregular cycle 03/20/2016  . Fibroid uterus 03/20/2016  . Iron deficiency anemia 03/20/2016    Past Surgical History:  Procedure Laterality Date  . BREAST EXCISIONAL BIOPSY Left    2003 benign  . FOOT SURGERY    . MYRINGOTOMY WITH TUBE PLACEMENT    . SKIN GRAFT    . WISDOM TOOTH EXTRACTION      OB History    Gravida Para Term Preterm AB Living   1 1 1     1    SAB TAB Ectopic Multiple Live Births                   Home Medications    Prior to Admission medications   Medication Sig Start Date End Date Taking? Authorizing Provider  ferrous sulfate 325 (65 FE) MG tablet Take 325 mg by mouth daily with breakfast.    [provider]  megestrol (MEGACE) 40 MG tablet Take 1 tablet (40 mg total) by mouth 2 (two) times daily. 04/22/17   Lavonia Drafts, MD    Family History Family History  Problem Relation Age of Onset  . Hypertension Mother   . Hypertension Father   . Cancer Father        lungs  .  Hypertension Sister   . Cancer Sister        pancreatitic  . Diabetes Maternal Aunt   . Diabetes Maternal Uncle     Social History Social History   Tobacco Use  . Smoking status: Never Smoker  . Smokeless tobacco: Never Used  Substance Use Topics  . Alcohol use: Yes    Comment: occasionally  . Drug use: No     Allergies   Dilaudid [hydromorphone hcl]   Review of Systems Review of Systems  HENT: Positive for rhinorrhea and sore throat.   Eyes: Positive for redness.  Respiratory: Positive for cough.   Gastrointestinal: Negative for abdominal pain, diarrhea, nausea and vomiting.  Skin: Negative for rash.  All other systems reviewed and are negative.    Physical Exam Updated Vital Signs BP 118/85 (BP Location: Left Arm)   Pulse 91   Temp 99.2 F (37.3 C) (Oral)   Resp 18   Ht 5\' 3"  (1.6 m)   Wt 67.6 kg (149 lb)   LMP 05/16/2017   SpO2 100%   BMI 26.39 kg/m   Physical Exam  Constitutional: She appears well-developed and well-nourished. No distress.  HENT:  Head: Normocephalic and atraumatic.  Eyes: Conjunctivae are  normal.  Neck: Neck supple.  Cardiovascular: Normal rate and regular rhythm.  No murmur heard. Pulmonary/Chest: Effort normal and breath sounds normal. No respiratory distress.  Abdominal: Soft. Bowel sounds are normal. There is no tenderness.  Musculoskeletal: Normal range of motion. She exhibits no edema.  Neurological: She is alert.  Skin: Skin is warm and dry.  Psychiatric: She has a normal mood and affect.  Nursing note and vitals reviewed.    ED Treatments / Results  Labs (all labs ordered are listed, but only abnormal results are displayed) Labs Reviewed - No data to display  EKG  EKG Interpretation None       Radiology No results found.  Procedures Procedures (including critical care time)  Medications Ordered in ED Medications  benzonatate (TESSALON) capsule 200 mg (200 mg Oral Given 06/02/17 0011)     Initial  Impression / Assessment and Plan / ED Course  I have reviewed the triage vital signs and the nursing notes.  Pertinent labs & imaging results that were available during my care of the patient were reviewed by me and considered in my medical decision making (see chart for details).     Pt symptoms consistent with URI. CXR negative for acute infiltrate. Pt will be discharged with symptomatic treatment.  Discussed return precautions.  Pt is hemodynamically stable & in NAD prior to discharge.  Final Clinical Impressions(s) / ED Diagnoses   Final diagnoses:  Cough  Viral URI with cough    ED Discharge Orders        Ordered    benzonatate (TESSALON) 100 MG capsule  Every 8 hours     06/01/17 2355       Etta Quill, NP 06/02/17 3875    Merryl Hacker, MD 06/02/17 [phone]

## 2017-06-07 ENCOUNTER — Ambulatory Visit (INDEPENDENT_AMBULATORY_CARE_PROVIDER_SITE_OTHER): Payer: No Typology Code available for payment source | Admitting: Obstetrics & Gynecology

## 2017-06-07 ENCOUNTER — Encounter: Payer: Self-pay | Admitting: Obstetrics & Gynecology

## 2017-06-07 ENCOUNTER — Telehealth: Payer: Self-pay | Admitting: *Deleted

## 2017-06-07 VITALS — BP 113/77 | HR 87 | Wt 158.8 lb

## 2017-06-07 DIAGNOSIS — Z1151 Encounter for screening for human papillomavirus (HPV): Secondary | ICD-10-CM

## 2017-06-07 DIAGNOSIS — B3731 Acute candidiasis of vulva and vagina: Secondary | ICD-10-CM

## 2017-06-07 DIAGNOSIS — B373 Candidiasis of vulva and vagina: Secondary | ICD-10-CM

## 2017-06-07 DIAGNOSIS — Z124 Encounter for screening for malignant neoplasm of cervix: Secondary | ICD-10-CM

## 2017-06-07 NOTE — Telephone Encounter (Signed)
Pt left without getting her A1C drawn. Called patient, she will come back.

## 2017-06-07 NOTE — Progress Notes (Signed)
History:  46 y.o. G1P1001 here today for AUB> Pt reports that her menses are improved. She has not been taking the Megace. Her cycles are 7 days/month. She has 3 days heavy nfd then light. She denies pain with her cycles.   Pt denies bleeding between cycles.  Pt reports a FH of DM in her mother and her sister. She reports that she has not been screened for DM.        The following portions of the patient's history were reviewed and updated as appropriate: allergies, current medications, past family history, past medical history, past social history, past surgical history and problem list.  Review of Systems:  Pertinent items are noted in HPI.   Objective:  Physical Exam Blood pressure 113/77, pulse 87, weight 158 lb 12.8 oz (72 kg), last menstrual period 05/16/2017.  CONSTITUTIONAL: Well-developed, well-nourished female in no acute distress.  HENT:  Normocephalic, atraumatic EYES: Conjunctivae and EOM are normal. No scleral icterus.  NECK: Normal range of motion SKIN: Skin is warm and dry. No rash noted. Not diaphoretic.No pallor. [place]: Alert and oriented to person, place, and time. Normal coordination.  Pelvic: GU: EGBUS: vulva and vagina are beefy red and edematous Vagina: no blood in vault Cervix: no lesion; no mucopurulent d/c   9/29/20017 ATYPICAL SQUAMOUS CELLS OF UNDETERMINED SIGNIFICANCE (ASC-US). FUNGAL ORGANISMS PRESENT CONSISTENT WITH CANDIDA SPP. Gail Males MD Pathologist, Electronic Signature (Case signed 03/24/2016) Source CervicoVaginal Pap [ThinPrep Imaged] Molecular Results Test Result HPV High Risk NOT DETECTED  Assessment & Plan:  F/u AUB- sx improved. No meds at present and cycles back to baseline.   F/u abnormal PAP  PAP with hrHPV obtained today  Yeast vulvovaginits  Signs appear to be chronic in nature. Pt asymptomatic. Need to r/o DM  KOH and wet mount.    HgbA1C  F/u in 1 year or sooner prn  Total face-to-face time with patient was 18  min.  Greater than 50% was spent in counseling and coordination of care with the patient.   Gail Moreno, M.D., Gail Moreno

## 2017-06-08 LAB — CERVICOVAGINAL ANCILLARY ONLY
Bacterial vaginitis: POSITIVE — AB
CANDIDA VAGINITIS: NEGATIVE
Trichomonas: NEGATIVE

## 2017-06-08 LAB — HEMOGLOBIN A1C
ESTIMATED AVERAGE GLUCOSE: 108 mg/dL
HEMOGLOBIN A1C: 5.4 % (ref 4.8–5.6)

## 2017-06-09 ENCOUNTER — Other Ambulatory Visit: Payer: Self-pay | Admitting: Obstetrics & Gynecology

## 2017-06-09 DIAGNOSIS — B9689 Other specified bacterial agents as the cause of diseases classified elsewhere: Secondary | ICD-10-CM

## 2017-06-09 DIAGNOSIS — N760 Acute vaginitis: Principal | ICD-10-CM

## 2017-06-09 MED ORDER — METRONIDAZOLE 500 MG PO TABS: 500.0000 mg | ORAL_TABLET | Freq: Two times a day (BID) | ORAL | 0 refills | Status: DC

## 2017-06-10 LAB — CYTOLOGY - PAP
DIAGNOSIS: NEGATIVE
HPV: NOT DETECTED

## 2017-12-03 ENCOUNTER — Other Ambulatory Visit: Payer: Self-pay

## 2017-12-03 ENCOUNTER — Encounter (HOSPITAL_BASED_OUTPATIENT_CLINIC_OR_DEPARTMENT_OTHER): Payer: Self-pay | Admitting: *Deleted

## 2017-12-03 ENCOUNTER — Emergency Department (HOSPITAL_BASED_OUTPATIENT_CLINIC_OR_DEPARTMENT_OTHER)
Admission: EM | Admit: 2017-12-03 | Discharge: 2017-12-03 | Disposition: A | Discharge status: Home / Self Care | Payer: No Typology Code available for payment source | Attending: Emergency Medicine | Admitting: Emergency Medicine

## 2017-12-03 DIAGNOSIS — R05 Cough: Secondary | ICD-10-CM | POA: Insufficient documentation

## 2017-12-03 DIAGNOSIS — R053 Chronic cough: Secondary | ICD-10-CM

## 2017-12-03 MED ORDER — ALBUTEROL SULFATE HFA 108 (90 BASE) MCG/ACT IN AERS
1.0000 | INHALATION_SPRAY | Freq: Four times a day (QID) | RESPIRATORY_TRACT | Status: DC | PRN
  Administered 2017-12-03: 1 via RESPIRATORY_TRACT
  Filled 2017-12-03: qty 6.7

## 2017-12-03 NOTE — ED Provider Notes (Signed)
[place] EMERGENCY DEPARTMENT Provider Note   CSN: [id number] Arrival date & time: 12/03/17  1722     History   Chief Complaint Chief Complaint  Patient presents with  . Cough    HPI Gail Moreno is a 47 y.o. female.   47 year old female presents to the emergency department for evaluation of a cough.  She reports onset of her cough 2 years ago.  She was seen at urgent care in April 2017, diagnosed with pneumonia and given antibiotics.  Cough has continued to persist since this time.  It waxes and wanes in severity and is without known modifying factors.  Cough is sometimes productive of phlegm.  Phlegm varies from clear to a nondescript color.  She has tried an inhaler in the past for symptoms, but cannot recall if it provided her any improvement.  She has tried over-the-counter allergy medications as well without relief.  She comes to the emergency department today because she is concerned about risk of cancer.  She states that she read this could be a possibility when she was searching her symptoms on Google.  She has not had any fevers, hemoptysis.  She is not a smoker and denies consistent exposure to secondhand smoke.     Past Medical History:  Diagnosis Date  . Fibroid tumor     Patient Active Problem List   Diagnosis Date Noted  . Menorrhagia with irregular cycle 03/20/2016  . Fibroid uterus 03/20/2016  . Iron deficiency anemia 03/20/2016    Past Surgical History:  Procedure Laterality Date  . BREAST EXCISIONAL BIOPSY Left    2003 benign  . FOOT SURGERY    . MYRINGOTOMY WITH TUBE PLACEMENT    . SKIN GRAFT    . WISDOM TOOTH EXTRACTION       OB History    Gravida  1   Para  1   Term  1   Preterm      AB      Living  1     SAB      TAB      Ectopic      Multiple      Live Births               Home Medications    Prior to Admission medications   Medication Sig Start Date End Date Taking? Authorizing Provider  benzonatate  (TESSALON) 100 MG capsule Take 1 capsule (100 mg total) by mouth every 8 (eight) hours. Patient not taking: Reported on 06/07/2017 06/01/17   Etta Quill, NP  ferrous sulfate 325 (65 FE) MG tablet Take 325 mg by mouth daily with breakfast.    [provider]  megestrol (MEGACE) 40 MG tablet Take 1 tablet (40 mg total) by mouth 2 (two) times daily. Patient not taking: Reported on 06/07/2017 04/22/17   Lavonia Drafts, MD  metroNIDAZOLE (FLAGYL) 500 MG tablet Take 1 tablet (500 mg total) by mouth 2 (two) times daily. 06/09/17   Lavonia Drafts, MD    Family History Family History  Problem Relation Age of Onset  . Hypertension Mother   . Hypertension Father   . Cancer Father        lungs  . Hypertension Sister   . Cancer Sister        pancreatitic  . Diabetes Maternal Aunt   . Diabetes Maternal Uncle     Social History Social History   Tobacco Use  . Smoking status: Never Smoker  . Smokeless tobacco:  Never Used  Substance Use Topics  . Alcohol use: Yes    Comment: occasionally  . Drug use: No     Allergies   Dilaudid [hydromorphone hcl]   Review of Systems Review of Systems Ten systems reviewed and are negative for acute change, except as noted in the HPI.    Physical Exam Updated Vital Signs BP 115/81 (BP Location: Right Arm)   Pulse 81   Temp 98.1 F (36.7 C) (Oral)   Resp 16   Ht 5' 2.5" (1.588 m)   Wt 72.6 kg (160 lb)   LMP 11/27/2017   SpO2 100%   BMI 28.80 kg/m   Physical Exam  Constitutional: She is oriented to person, place, and time. She appears well-developed and well-nourished. No distress.  Nontoxic appearing and in NAD  HENT:  Head: Normocephalic and atraumatic.  Eyes: Conjunctivae and EOM are normal. No scleral icterus.  Neck: Normal range of motion.  Cardiovascular: Normal rate, regular rhythm and intact distal pulses.  Pulmonary/Chest: Effort normal. No stridor. No respiratory distress. She has no wheezes.  Lungs  CTAB. Respirations even and unlabored.  Musculoskeletal: Normal range of motion.  Neurological: She is alert and oriented to person, place, and time. She exhibits normal muscle tone. Coordination normal.  Skin: Skin is warm and dry. No rash noted. She is not diaphoretic. No erythema. No pallor.  Psychiatric: She has a normal mood and affect. Her behavior is normal.  Nursing note and vitals reviewed.    ED Treatments / Results  Labs (all labs ordered are listed, but only abnormal results are displayed) Labs Reviewed - No data to display  EKG None  Radiology No results found.  Procedures Procedures (including critical care time)  Medications Ordered in ED Medications  albuterol (PROVENTIL HFA;VENTOLIN HFA) 108 (90 Base) MCG/ACT inhaler 1-2 puff (1 puff Inhalation Given 12/03/17 1836)     Initial Impression / Assessment and Plan / ED Course  I have reviewed the triage vital signs and the nursing notes.  Pertinent labs & imaging results that were available during my care of the patient were reviewed by me and considered in my medical decision making (see chart for details).     Patient presenting for 2 years of a chronic cough.  She is not noted to have any cough during my assessment, while in the room.  Low suspicion for infectious etiology in light of symptom chronicity.  Patient also without fever, tachypnea, dyspnea, hypoxia to suggest pneumonia.  She has clear lung sounds.  Discussed symptomatic management with albuterol.  I have also recommended the use of Zantac daily given that reflux may be contributing to her ongoing cough.  Will refer to pulmonology for follow-up.  Verbalizes understanding and is agreeable with plan. Return precautions discussed and provided. Patient discharged in stable condition with no unaddressed concerns.   Final Clinical Impressions(s) / ED Diagnoses   Final diagnoses:  Chronic cough    ED Discharge Orders    None       Antonietta Breach,  Hershal Coria 12/03/17 Jackey Loge, MD 12/04/17 [id number]

## 2017-12-03 NOTE — Discharge Instructions (Signed)
We recommend that you try Zantac as your symptoms may be related to reflux.  Take this medication as instructed on the box.  It can be purchased over-the-counter at your local pharmacy.  You may use an albuterol inhaler as needed for wheezing, cough, shortness of breath.  Use 1 to 2 puffs every 4-6 hours for persistent symptoms.  We recommend follow-up with a pulmonologist for further evaluation of your ongoing symptoms.  You may return to the ED if symptoms persist or worsen.

## 2017-12-03 NOTE — ED Triage Notes (Signed)
Cough x 2 years. Productive with sometimes clear and sometimes with a color.

## 2017-12-03 NOTE — ED Notes (Signed)
ED Provider at bedside. 

## 2018-01-05 ENCOUNTER — Encounter (HOSPITAL_BASED_OUTPATIENT_CLINIC_OR_DEPARTMENT_OTHER): Payer: Self-pay

## 2018-01-05 ENCOUNTER — Other Ambulatory Visit: Payer: Self-pay

## 2018-01-05 ENCOUNTER — Emergency Department (HOSPITAL_BASED_OUTPATIENT_CLINIC_OR_DEPARTMENT_OTHER)
Admission: EM | Admit: 2018-01-05 | Discharge: 2018-01-06 | Disposition: A | Discharge status: Home / Self Care | Payer: No Typology Code available for payment source | Attending: Emergency Medicine | Admitting: Emergency Medicine

## 2018-01-05 DIAGNOSIS — T23232A Burn of second degree of multiple left fingers (nail), not including thumb, initial encounter: Secondary | ICD-10-CM | POA: Insufficient documentation

## 2018-01-05 DIAGNOSIS — T22112A Burn of first degree of left forearm, initial encounter: Secondary | ICD-10-CM | POA: Insufficient documentation

## 2018-01-05 DIAGNOSIS — T23132A Burn of first degree of multiple left fingers (nail), not including thumb, initial encounter: Secondary | ICD-10-CM | POA: Insufficient documentation

## 2018-01-05 DIAGNOSIS — Y92007 Garden or yard of unspecified non-institutional (private) residence as the place of occurrence of the external cause: Secondary | ICD-10-CM | POA: Insufficient documentation

## 2018-01-05 DIAGNOSIS — Y998 Other external cause status: Secondary | ICD-10-CM | POA: Insufficient documentation

## 2018-01-05 DIAGNOSIS — Y93G2 Activity, grilling and smoking food: Secondary | ICD-10-CM | POA: Insufficient documentation

## 2018-01-05 DIAGNOSIS — Z79899 Other long term (current) drug therapy: Secondary | ICD-10-CM | POA: Insufficient documentation

## 2018-01-05 DIAGNOSIS — T23131A Burn of first degree of multiple right fingers (nail), not including thumb, initial encounter: Secondary | ICD-10-CM | POA: Insufficient documentation

## 2018-01-05 DIAGNOSIS — X04XXXA Exposure to ignition of highly flammable material, initial encounter: Secondary | ICD-10-CM | POA: Insufficient documentation

## 2018-01-05 DIAGNOSIS — T2010XA Burn of first degree of head, face, and neck, unspecified site, initial encounter: Secondary | ICD-10-CM | POA: Insufficient documentation

## 2018-01-05 MED ORDER — NAPROXEN 250 MG PO TABS
500.0000 mg | ORAL_TABLET | Freq: Once | ORAL | Status: AC
  Administered 2018-01-06: 500 mg via ORAL
  Filled 2018-01-05: qty 2
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
## 2018-01-05 NOTE — ED Notes (Signed)
ED Provider at bedside. 

## 2018-01-06 MED ORDER — HYDROCODONE-ACETAMINOPHEN 5-325 MG PO TABS: 1.0000 | ORAL_TABLET | Freq: Four times a day (QID) | ORAL | 0 refills | Status: DC | PRN

## 2018-02-06 ENCOUNTER — Emergency Department (HOSPITAL_BASED_OUTPATIENT_CLINIC_OR_DEPARTMENT_OTHER)
Admission: EM | Admit: 2018-02-06 | Discharge: 2018-02-06 | Disposition: A | Discharge status: Home / Self Care | Payer: No Typology Code available for payment source | Attending: Emergency Medicine | Admitting: Emergency Medicine

## 2018-02-06 ENCOUNTER — Other Ambulatory Visit: Payer: Self-pay

## 2018-02-06 ENCOUNTER — Encounter (HOSPITAL_BASED_OUTPATIENT_CLINIC_OR_DEPARTMENT_OTHER): Payer: Self-pay | Admitting: Adult Health

## 2018-02-06 DIAGNOSIS — R1030 Lower abdominal pain, unspecified: Secondary | ICD-10-CM | POA: Insufficient documentation

## 2018-02-06 DIAGNOSIS — R1084 Generalized abdominal pain: Secondary | ICD-10-CM

## 2018-02-06 DIAGNOSIS — Z79899 Other long term (current) drug therapy: Secondary | ICD-10-CM | POA: Insufficient documentation

## 2018-02-06 LAB — CBC WITH DIFFERENTIAL/PLATELET
Basophils Absolute: 0 10*3/uL (ref 0.0–0.1)
Basophils Relative: 0 %
EOS ABS: 0.1 10*3/uL (ref 0.0–0.7)
Eosinophils Relative: 1 %
HCT: 32.3 % — ABNORMAL LOW (ref 36.0–46.0)
HEMOGLOBIN: 10.8 g/dL — AB (ref 12.0–15.0)
LYMPHS ABS: 1.9 10*3/uL (ref 0.7–4.0)
Lymphocytes Relative: 33 %
MCH: 28.7 pg (ref 26.0–34.0)
MCHC: 33.4 g/dL (ref 30.0–36.0)
MCV: 85.9 fL (ref 78.0–100.0)
MONOS PCT: 11 %
Monocytes Absolute: 0.7 10*3/uL (ref 0.1–1.0)
NEUTROS PCT: 55 %
Neutro Abs: 3.2 10*3/uL (ref 1.7–7.7)
Platelets: 223 10*3/uL (ref 150–400)
RBC: 3.76 MIL/uL — ABNORMAL LOW (ref 3.87–5.11)
RDW: 13.4 % (ref 11.5–15.5)
WBC: 5.9 10*3/uL (ref 4.0–10.5)

## 2018-02-06 LAB — LIPASE, BLOOD: LIPASE: 29 U/L (ref 11–51)

## 2018-02-06 LAB — COMPREHENSIVE METABOLIC PANEL
ALK PHOS: 83 U/L (ref 38–126)
ALT: 13 U/L (ref 0–44)
ANION GAP: 5 (ref 5–15)
AST: 21 U/L (ref 15–41)
Albumin: 4.1 g/dL (ref 3.5–5.0)
BUN: 16 mg/dL (ref 6–20)
CALCIUM: 8.8 mg/dL — AB (ref 8.9–10.3)
CO2: 25 mmol/L (ref 22–32)
Chloride: 109 mmol/L (ref 98–111)
Creatinine, Ser: 0.79 mg/dL (ref 0.44–1.00)
Glucose, Bld: 87 mg/dL (ref 70–99)
Potassium: 4.4 mmol/L (ref 3.5–5.1)
SODIUM: 139 mmol/L (ref 135–145)
TOTAL PROTEIN: 7.8 g/dL (ref 6.5–8.1)
Total Bilirubin: 0.3 mg/dL (ref 0.3–1.2)

## 2018-02-06 LAB — URINALYSIS, ROUTINE W REFLEX MICROSCOPIC
BILIRUBIN URINE: NEGATIVE
GLUCOSE, UA: NEGATIVE mg/dL
Hgb urine dipstick: NEGATIVE
Ketones, ur: NEGATIVE mg/dL
Leukocytes, UA: NEGATIVE
Nitrite: NEGATIVE
PH: 8.5 — AB (ref 5.0–8.0)
Protein, ur: NEGATIVE mg/dL
Specific Gravity, Urine: 1.015 (ref 1.005–1.030)

## 2018-02-06 LAB — PREGNANCY, URINE: Preg Test, Ur: NEGATIVE

## 2018-02-06 NOTE — ED Provider Notes (Signed)
[facility] Provider Note   CSN: [id number] Arrival date & time: 02/06/18  1919     History   Chief Complaint Chief Complaint  Patient presents with  . Flank Pain    HPI Gail Moreno is a 47 y.o. female.  Patient with no past surgical history presents the emergency department with complaint of lower abdominal pain that wraps around her lower abdomen to her back on both sides starting about 6 days ago.  When patient is lying flat she does not really feel much in the way of pain however when she tries to bend or twist it hurts a lot more.  Symptoms have been intermittent over the past week but was more severe today prompting emergency department visit.  Patient was concerned about having a urinary tract infection.  She denies any fevers, nausea, vomiting, or diarrhea.  No constipation.  No burning with urination, increased frequency or urgency, blood in the urine.  She tried drinking cranberry juice without relief.  She noted a slight amount of vaginal bleeding upon arrival but no abnormal discharge.  Patient does a lot of bending and lifting at her job.  She denies heavy NSAID use or alcohol use.     Past Medical History:  Diagnosis Date  . Fibroid tumor     Patient Active Problem List   Diagnosis Date Noted  . Menorrhagia with irregular cycle 03/20/2016  . Fibroid uterus 03/20/2016  . Iron deficiency anemia 03/20/2016    Past Surgical History:  Procedure Laterality Date  . BREAST EXCISIONAL BIOPSY Left    2003 benign  . FOOT SURGERY    . MYRINGOTOMY WITH TUBE PLACEMENT    . SKIN GRAFT    . WISDOM TOOTH EXTRACTION       OB History    Gravida  1   Para  1   Term  1   Preterm      AB      Living  1     SAB      TAB      Ectopic      Multiple      Live Births               Home Medications    Prior to Admission medications   Medication Sig Start Date End Date Taking? Authorizing Provider  ferrous sulfate 325  (65 FE) MG tablet Take 325 mg by mouth daily with breakfast.    [provider]  HYDROcodone-acetaminophen (NORCO) 5-325 MG tablet Take 1 tablet by mouth every 6 (six) hours as needed (for pain). 01/06/18   Molpus, John, MD    Family History Family History  Problem Relation Age of Onset  . Hypertension Mother   . Hypertension Father   . Cancer Father        lungs  . Hypertension Sister   . Cancer Sister        pancreatitic  . Diabetes Maternal Aunt   . Diabetes Maternal Uncle     Social History Social History   Tobacco Use  . Smoking status: Never Smoker  . Smokeless tobacco: Never Used  Substance Use Topics  . Alcohol use: Yes    Comment: occasionally  . Drug use: No     Allergies   Dilaudid [hydromorphone hcl]   Review of Systems Review of Systems  Constitutional: Negative for fever.  HENT: Negative for rhinorrhea and sore throat.   Eyes: Negative for redness.  Respiratory: Negative for  cough.   Cardiovascular: Negative for chest pain.  Gastrointestinal: Positive for abdominal pain. Negative for diarrhea, nausea and vomiting.  Genitourinary: Positive for flank pain. Negative for dysuria and vaginal discharge.  Musculoskeletal: Negative for myalgias.  Skin: Negative for rash.  Neurological: Negative for headaches.     Physical Exam Updated Vital Signs BP 130/67 (BP Location: Left Arm)   Pulse 79   Temp 98.7 F (37.1 C) (Oral)   Resp 18   LMP 01/20/2018 (Exact Date)   SpO2 100%   Physical Exam  Constitutional: She appears well-developed and well-nourished.  HENT:  Head: Normocephalic and atraumatic.  Eyes: Conjunctivae are normal. Right eye exhibits no discharge. Left eye exhibits no discharge.  Neck: Normal range of motion. Neck supple.  Cardiovascular: Normal rate, regular rhythm and normal heart sounds.  Pulmonary/Chest: Effort normal and breath sounds normal. No respiratory distress. She has no wheezes. She has no rales.  Abdominal:  Soft. There is no tenderness. There is no rebound and no guarding.  Neurological: She is alert.  Skin: Skin is warm and dry.  Psychiatric: She has a normal mood and affect.  Nursing note and vitals reviewed.    ED Treatments / Results  Labs (all labs ordered are listed, but only abnormal results are displayed) Labs Reviewed  URINALYSIS, ROUTINE W REFLEX MICROSCOPIC - Abnormal; Notable for the following components:      Result Value   pH 8.5 (*)    All other components within normal limits  CBC WITH DIFFERENTIAL/PLATELET - Abnormal; Notable for the following components:   RBC 3.76 (*)    Hemoglobin 10.8 (*)    HCT 32.3 (*)    All other components within normal limits  COMPREHENSIVE METABOLIC PANEL - Abnormal; Notable for the following components:   Calcium 8.8 (*)    All other components within normal limits  PREGNANCY, URINE  LIPASE, BLOOD    EKG None  Radiology No results found.  Procedures Procedures (including critical care time)  Medications Ordered in ED Medications - No data to display   Initial Impression / Assessment and Plan / ED Course  I have reviewed the triage vital signs and the nursing notes.  Pertinent labs & imaging results that were available during my care of the patient were reviewed by me and considered in my medical decision making (see chart for details).     Patient seen and examined.  Updated on urine test result.  Will check basic labs.  Do not feel that patient needs imaging unless lab abnormality exists.  If negative, will treat symptoms.  Vital signs reviewed and are as follows: BP 130/67 (BP Location: Left Arm)   Pulse 79   Temp 98.7 F (37.1 C) (Oral)   Resp 18   LMP 01/20/2018 (Exact Date)   SpO2 100%   Lab work reassuring.  Patient updated on results.  Her exam is unchanged.  She is comfortable with symptomatic treatment at this time.  We discussed return instructions.  The patient was urged to return to the Emergency  Department immediately with worsening of current symptoms, worsening abdominal pain, persistent vomiting, blood noted in stools, fever, or any other concerns. The patient verbalized understanding.    Final Clinical Impressions(s) / ED Diagnoses   Final diagnoses:  Generalized abdominal pain   Patient with abdominal pain. Vitals are stable, no fever. Labs reassuring. Imaging not felt indicated. No signs of dehydration, patient is tolerating PO's. Lungs are clear and no signs suggestive of PNA. Low  concern for appendicitis, cholecystitis, pancreatitis, ruptured viscus, UTI, kidney stone, aortic dissection, aortic aneurysm or other emergent abdominal etiology. Supportive therapy indicated with return if symptoms worsen.     ED Discharge Orders    None       Carlisle Cater, PA-C 02/07/18 0001    Charlesetta Shanks, MD 02/07/18 1326

## 2018-02-06 NOTE — ED Triage Notes (Addendum)
Presents with pain that began Monday while trying to stand up. THe pain was in her lower abdomen and all the way around to her back. The pain is intermittent and only when trying to standup from sitting or lying. Today the pain is only on the left side of the back.  Denies fevers, denies pain with urination.

## 2018-02-06 NOTE — Discharge Instructions (Signed)
Please read and follow all provided instructions.  Your diagnoses today include:  1. Generalized abdominal pain     Tests performed today include:  Blood counts and electrolytes  Blood tests to check liver and kidney function  Blood tests to check pancreas function  Urine test to look for infection and pregnancy (in women)  Vital signs. See below for your results today.   Medications prescribed:   None  Take any prescribed medications only as directed.  Home care instructions:   Follow any educational materials contained in this packet.  Follow-up instructions: Please follow-up with your primary care provider in the next 53 days for further evaluation of your symptoms.    Return instructions:  SEEK IMMEDIATE MEDICAL ATTENTION IF:  The pain does not go away or becomes severe   A temperature above 101F develops   Repeated vomiting occurs (multiple episodes)   The pain becomes localized to portions of the abdomen. The right side could possibly be appendicitis. In an adult, the left lower portion of the abdomen could be colitis or diverticulitis.   Blood is being passed in stools or vomit (bright red or black tarry stools)   You develop chest pain, difficulty breathing, dizziness or fainting, or become confused, poorly responsive, or inconsolable (young children)  If you have any other emergent concerns regarding your health  Additional Information: Abdominal (belly) pain can be caused by many things. Your caregiver performed an examination and possibly ordered blood/urine tests and imaging (CT scan, x-rays, ultrasound). Many cases can be observed and treated at home after initial evaluation in the emergency department. Even though you are being discharged home, abdominal pain can be unpredictable. Therefore, you need a repeated exam if your pain does not resolve, returns, or worsens. Most patients with abdominal pain don't have to be admitted to the hospital or have  surgery, but serious problems like appendicitis and gallbladder attacks can start out as nonspecific pain. Many abdominal conditions cannot be diagnosed in one visit, so follow-up evaluations are very important.  Your vital signs today were: BP 118/85    Pulse 69    Temp 98.7 F (37.1 C) (Oral)    Resp 20    LMP 01/20/2018 (Exact Date)    SpO2 100%  If your blood pressure (bp) was elevated above 135/85 this visit, please have this repeated by your doctor within one month. --------------

## 2018-02-06 NOTE — ED Notes (Signed)
Alert, NAD, calm, interactive, resps e/u, speaking in clear complete sentences, no dyspnea noted, skin W&D, VSS, (denies: fever, bleeding, sob, NVD, dizziness or visual changes), EDP into room. Family at [facility].

## 2018-03-14 ENCOUNTER — Encounter: Payer: Self-pay | Admitting: Pulmonary Disease

## 2018-03-14 ENCOUNTER — Ambulatory Visit (INDEPENDENT_AMBULATORY_CARE_PROVIDER_SITE_OTHER): Payer: No Typology Code available for payment source | Admitting: Pulmonary Disease

## 2018-03-14 ENCOUNTER — Encounter: Payer: Self-pay | Admitting: Family Medicine

## 2018-03-14 ENCOUNTER — Ambulatory Visit (HOSPITAL_BASED_OUTPATIENT_CLINIC_OR_DEPARTMENT_OTHER)
Admission: RE | Admit: 2018-03-14 | Discharge: 2018-03-14 | Disposition: A | Discharge status: Home / Self Care | Payer: Self-pay | Source: Ambulatory Visit | Attending: Pulmonary Disease | Admitting: Pulmonary Disease

## 2018-03-14 ENCOUNTER — Ambulatory Visit (HOSPITAL_COMMUNITY)
Admission: RE | Admit: 2018-03-14 | Discharge: 2018-03-14 | Disposition: A | Discharge status: Home / Self Care | Payer: Self-pay | Source: Ambulatory Visit | Attending: Family Medicine | Admitting: Family Medicine

## 2018-03-14 ENCOUNTER — Ambulatory Visit: Payer: Self-pay | Attending: Family Medicine | Admitting: Family Medicine

## 2018-03-14 VITALS — BP 108/74 | HR 75 | Ht 63.0 in | Wt 163.0 lb

## 2018-03-14 VITALS — BP 123/81 | HR 63 | Temp 98.1°F | Resp 17 | Ht 63.0 in | Wt 163.2 lb

## 2018-03-14 DIAGNOSIS — R05 Cough: Secondary | ICD-10-CM | POA: Insufficient documentation

## 2018-03-14 DIAGNOSIS — M545 Low back pain: Secondary | ICD-10-CM

## 2018-03-14 DIAGNOSIS — Z808 Family history of malignant neoplasm of other organs or systems: Secondary | ICD-10-CM | POA: Insufficient documentation

## 2018-03-14 DIAGNOSIS — R059 Cough, unspecified: Secondary | ICD-10-CM

## 2018-03-14 DIAGNOSIS — D649 Anemia, unspecified: Secondary | ICD-10-CM

## 2018-03-14 DIAGNOSIS — J069 Acute upper respiratory infection, unspecified: Secondary | ICD-10-CM

## 2018-03-14 DIAGNOSIS — M5440 Lumbago with sciatica, unspecified side: Secondary | ICD-10-CM | POA: Insufficient documentation

## 2018-03-14 DIAGNOSIS — D509 Iron deficiency anemia, unspecified: Secondary | ICD-10-CM | POA: Insufficient documentation

## 2018-03-14 DIAGNOSIS — J301 Allergic rhinitis due to pollen: Secondary | ICD-10-CM

## 2018-03-14 DIAGNOSIS — Z23 Encounter for immunization: Secondary | ICD-10-CM

## 2018-03-14 DIAGNOSIS — Z8249 Family history of ischemic heart disease and other diseases of the circulatory system: Secondary | ICD-10-CM | POA: Insufficient documentation

## 2018-03-14 DIAGNOSIS — M62830 Muscle spasm of back: Secondary | ICD-10-CM

## 2018-03-14 DIAGNOSIS — D259 Leiomyoma of uterus, unspecified: Secondary | ICD-10-CM | POA: Insufficient documentation

## 2018-03-14 DIAGNOSIS — R1024 Suprapubic pain: Secondary | ICD-10-CM

## 2018-03-14 DIAGNOSIS — K219 Gastro-esophageal reflux disease without esophagitis: Secondary | ICD-10-CM | POA: Insufficient documentation

## 2018-03-14 DIAGNOSIS — R102 Pelvic and perineal pain: Secondary | ICD-10-CM

## 2018-03-14 LAB — POCT URINALYSIS DIP (CLINITEK)
Bilirubin, UA: NEGATIVE
Glucose, UA: NEGATIVE mg/dL
Ketones, POC UA: NEGATIVE mg/dL
Leukocytes, UA: NEGATIVE
Nitrite, UA: NEGATIVE
POC,PROTEIN,UA: NEGATIVE
Spec Grav, UA: 1.025
Urobilinogen, UA: 0.2 U/dL
pH, UA: 6

## 2018-03-14 MED ORDER — CYCLOBENZAPRINE HCL 10 MG PO TABS: 10.0000 mg | ORAL_TABLET | Freq: Every day | ORAL | 1 refills | Status: DC

## 2018-03-14 MED ORDER — PREDNISONE 20 MG PO TABS: ORAL_TABLET | ORAL | 0 refills | Status: DC

## 2018-03-14 MED ORDER — BENZONATATE 200 MG PO CAPS: 200.0000 mg | ORAL_CAPSULE | Freq: Three times a day (TID) | ORAL | 1 refills | Status: DC | PRN

## 2018-03-14 NOTE — Patient Instructions (Signed)
Chronic cough: I think this is due to allergic rhinitis and gastroesophageal reflux disease.  Also cough can often cause cough.  What I mean is that ongoing laryngeal irritation will continue to occur as long as you are coughing. We will check a chest x-ray and a lung function test to make sure there is not a lung problem. We need to try to make the allergic rhinitis and the gastroesophageal reflux go away.  After we have done that it is important for you to try really hard to stop coughing. Use Tessalon as needed 200 mg every 8 hours as needed for cough You need to try to suppress your cough to allow your larynx (voice box) to heal.  For three days don't talk, laugh, sing, or clear your throat. Do everything you can to suppress the cough during this time. Use hard candies (sugarless Jolly Ranchers) or non-mint or non-menthol containing cough drops during this time to soothe your throat.  Use a cough suppressant (Delsym or what I have prescribed you) around the clock during this time.  After three days, gradually increase the use of your voice and back off on the cough suppressants.  GERD: Try taking pepcid over the counter twice a day for one month  Allergic rhinitis: Use Neil Med rinses with distilled water at least twice per day using the instructions on the package. 1/2 hour after using the [facility] Med rinse, use Nasacort two puffs in each nostril once per day.  Remember that the Nasacort can take 1-2 weeks to work after regular use. Use generic zyrtec (cetirizine) every day.  If this doesn't help, then stop taking it and use chlorpheniramine-phenylephrine combination tablets.  Follow up with me or a nurse practitioner in one month
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
## 2018-03-14 NOTE — Patient Instructions (Signed)
Back Pain, Adult Many adults have back pain from time to time. Common causes of back pain include:  A strained muscle or ligament.  Wear and tear (degeneration) of the spinal disks.  Arthritis.  A hit to the back.  Back pain can be short-lived (acute) or last a long time (chronic). A physical exam, lab tests, and imaging studies may be done to find the cause of your pain. Follow these instructions at home: Managing pain and stiffness  Take over-the-counter and prescription medicines only as told by your health care provider.  If directed, apply heat to the affected area as often as told by your health care provider. Use the heat source that your health care provider recommends, such as a moist heat pack or a heating pad. ? Place a towel between your skin and the heat source. ? Leave the heat on for 20-30 minutes. ? Remove the heat if your skin turns bright red. This is especially important if you are unable to feel pain, heat, or cold. You have a greater risk of getting burned.  If directed, apply ice to the injured area: ? Put ice in a plastic bag. ? Place a towel between your skin and the bag. ? Leave the ice on for 20 minutes, 2-3 times a day for the first 2-3 days. Activity  Do not stay in bed. Resting more than 1-2 days can delay your recovery.  Take short walks on even surfaces as soon as you are able. Try to increase the length of time you walk each day.  Do not sit, drive, or stand in one place for more than 30 minutes at a time. Sitting or standing for long periods of time can put stress on your back.  Use proper lifting techniques. When you bend and lift, use positions that put less stress on your back: ? Bend your knees. ? Keep the load close to your body. ? Avoid twisting.  Exercise regularly as told by your health care provider. Exercising will help your back heal faster. This also helps prevent back injuries by keeping muscles strong and flexible.  Your health  care provider may recommend that you see a physical therapist. This person can help you come up with a safe exercise program. Do any exercises as told by your physical therapist. Lifestyle  Maintain a healthy weight. Extra weight puts stress on your back and makes it difficult to have good posture.  Avoid activities or situations that make you feel anxious or stressed. Learn ways to manage anxiety and stress. One way to manage stress is through exercise. Stress and anxiety increase muscle tension and can make back pain worse. General instructions  Sleep on a firm mattress in a comfortable position. Try lying on your side with your knees slightly bent. If you lie on your back, put a pillow under your knees.  Follow your treatment plan as told by your health care provider. This may include: ? Cognitive or behavioral therapy. ? Acupuncture or massage therapy. ? Meditation or yoga. Contact a health care provider if:  You have pain that is not relieved with rest or medicine.  You have increasing pain going down into your legs or buttocks.  Your pain does not improve in 2 weeks.  You have pain at night.  You lose weight.  You have a fever or chills. Get help right away if:  You develop new bowel or bladder control problems.  You have unusual weakness or numbness in your arms   or legs.  You develop nausea or vomiting.  You develop abdominal pain.  You feel faint. Summary  Many adults have back pain from time to time. A physical exam, lab tests, and imaging studies may be done to find the cause of your pain.  Use proper lifting techniques. When you bend and lift, use positions that put less stress on your back.  Take over-the-counter and prescription medicines and apply heat or ice as directed by your health care provider. This information is not intended to replace advice given to you by your health care provider. Make sure you discuss any questions you have with your health care  provider. Document Released: 06/08/2005 Document Revised: 07/13/2016 Document Reviewed: 07/13/2016 Elsevier Interactive Patient Education  2018 [employer].  Iron Deficiency Anemia, Adult Iron-deficiency anemia is when you have a low amount of red blood cells or hemoglobin. This happens because you have too little iron in your body. Hemoglobin carries oxygen to parts of the body. Anemia can cause your body to not get enough oxygen. It may or may not cause symptoms. Follow these instructions at home: Medicines  Take over-the-counter and prescription medicines only as told by your doctor. This includes iron pills (supplements) and vitamins.  If you cannot handle taking iron pills by mouth, ask your doctor about getting iron through: ? A vein (intravenously). ? A shot (injection) into a muscle.  Take iron pills when your stomach is empty. If you cannot handle this, take them with food.  Do not drink milk or take antacids at the same time as your iron pills.  To prevent trouble pooping (constipation), eat fiber or take medicine (stool softener) as told by your doctor. Eating and drinking  Talk with your doctor before changing the foods you eat. He or she may tell you to eat foods that have a lot of iron, such as: ? Liver. ? Lowfat (lean) beef. ? Breads and cereals that have iron added to them (fortified breads and cereals). ? Eggs. ? Dried fruit. ? Dark green, leafy vegetables.  Drink enough fluid to keep your pee (urine) clear or pale yellow.  Eat fresh fruits and vegetables that are high in vitamin C. They help your body to use iron. Foods with a lot of vitamin C include: ? Oranges. ? Peppers. ? Tomatoes. ? Mangoes. General instructions  Return to your normal activities as told by your doctor. Ask your doctor what activities are safe for you.  Keep yourself clean, and keep things clean around you (your surroundings). Anemia can make you get sick more easily.  Keep all  follow-up visits as told by your doctor. This is important. Contact a doctor if:  You feel sick to your stomach (nauseous).  You throw up (vomit).  You feel weak.  You are sweating for no clear reason.  You have trouble pooping, such as: ? Pooping (having a bowel movement) less than 3 times a week. ? Straining to poop. ? Having poop that is hard, dry, or larger than normal. ? Feeling full or bloated. ? Pain in the lower belly. ? Not feeling better after pooping. Get help right away if:  You pass out (faint). If this happens, do not drive yourself to the hospital. Call your local emergency services (911 in the [place]  You have chest pain.  You have shortness of breath that: ? Is very bad. ? Gets worse with physical activity.  You have a fast heartbeat.  You get light-headed when getting up  from sitting or lying down. This information is not intended to replace advice given to you by your health care provider. Make sure you discuss any questions you have with your health care provider. Document Released: 07/11/2010 Document Revised: 02/26/2016 Document Reviewed: 02/26/2016 Elsevier Interactive Patient Education  2017 [employer].
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
## 2018-03-15 ENCOUNTER — Telehealth: Payer: Self-pay | Admitting: Pulmonary Disease

## 2018-03-15 NOTE — Telephone Encounter (Signed)
Attempted to call patient today to scheduled full PFT and f/u with NP or BQ in [facility] location in 1 month. I did not receive an answer at time of call. I have left a voicemail message for pt to return call. X1

## 2018-03-16 NOTE — Telephone Encounter (Signed)
Pt scheduled appt for f/u and PFT with s/o in front office. Nothing further needed.

## 2018-03-16 NOTE — Progress Notes (Signed)
LMTCB

## 2018-03-22 ENCOUNTER — Telehealth: Payer: Self-pay | Admitting: Pulmonary Disease

## 2018-03-22 NOTE — Telephone Encounter (Signed)
Called patient, unable to reach left message to give us a call back. 

## 2018-03-23 NOTE — Telephone Encounter (Signed)
Left message informing patient that message was regarding her PFT appt. Nothing further needed at this time.

## 2018-04-11 ENCOUNTER — Ambulatory Visit: Payer: Self-pay | Attending: Family Medicine | Admitting: Family Medicine

## 2018-04-11 ENCOUNTER — Ambulatory Visit (INDEPENDENT_AMBULATORY_CARE_PROVIDER_SITE_OTHER): Payer: Self-pay | Admitting: Pulmonary Disease

## 2018-04-11 ENCOUNTER — Ambulatory Visit (INDEPENDENT_AMBULATORY_CARE_PROVIDER_SITE_OTHER): Payer: Self-pay | Admitting: Primary Care

## 2018-04-11 ENCOUNTER — Encounter: Payer: Self-pay | Admitting: Primary Care

## 2018-04-11 ENCOUNTER — Encounter: Payer: Self-pay | Admitting: Family Medicine

## 2018-04-11 ENCOUNTER — Other Ambulatory Visit: Payer: Self-pay

## 2018-04-11 VITALS — BP 127/86 | HR 75 | Temp 98.0°F | Resp 18 | Ht 63.0 in | Wt 163.2 lb

## 2018-04-11 DIAGNOSIS — Z8249 Family history of ischemic heart disease and other diseases of the circulatory system: Secondary | ICD-10-CM | POA: Insufficient documentation

## 2018-04-11 DIAGNOSIS — R05 Cough: Secondary | ICD-10-CM

## 2018-04-11 DIAGNOSIS — K219 Gastro-esophageal reflux disease without esophagitis: Secondary | ICD-10-CM

## 2018-04-11 DIAGNOSIS — R053 Chronic cough: Secondary | ICD-10-CM

## 2018-04-11 DIAGNOSIS — G8929 Other chronic pain: Secondary | ICD-10-CM

## 2018-04-11 DIAGNOSIS — R059 Cough, unspecified: Secondary | ICD-10-CM

## 2018-04-11 DIAGNOSIS — J309 Allergic rhinitis, unspecified: Secondary | ICD-10-CM

## 2018-04-11 DIAGNOSIS — M545 Low back pain, unspecified: Secondary | ICD-10-CM

## 2018-04-11 DIAGNOSIS — R0609 Other forms of dyspnea: Secondary | ICD-10-CM

## 2018-04-11 DIAGNOSIS — J019 Acute sinusitis, unspecified: Secondary | ICD-10-CM

## 2018-04-11 LAB — PULMONARY FUNCTION TEST
DL/VA % PRED: 84 %
DL/VA: 4.02 ml/min/mmHg/L
DLCO unc % pred: 71 %
DLCO unc: 16.86 ml/min/mmHg
FEF 25-75 Post: 2.9 L/sec
FEF 25-75 Pre: 2 L/sec
FEF2575-%Change-Post: 44 %
FEF2575-%Pred-Post: 112 %
FEF2575-%Pred-Pre: 78 %
FEV1-%Change-Post: 8 %
FEV1-%PRED-PRE: 94 %
FEV1-%Pred-Post: 103 %
FEV1-PRE: 2.25 L
FEV1-Post: 2.45 L
FEV1FVC-%Change-Post: 5 %
FEV1FVC-%Pred-Pre: 98 %
FEV6-%CHANGE-POST: 5 %
FEV6-%PRED-POST: 101 %
FEV6-%PRED-PRE: 96 %
FEV6-POST: 2.88 L
FEV6-PRE: 2.74 L
FEV6FVC-%CHANGE-POST: 1 %
FEV6FVC-%PRED-PRE: 100 %
FEV6FVC-%Pred-Post: 102 %
FVC-%CHANGE-POST: 3 %
FVC-%Pred-Post: 98 %
FVC-%Pred-Pre: 95 %
FVC-Post: 2.88 L
FVC-Pre: 2.78 L
POST FEV6/FVC RATIO: 100 %
Post FEV1/FVC ratio: 85 %
Pre FEV1/FVC ratio: 81 %
Pre FEV6/FVC Ratio: 98 %
RV % PRED: 76 %
RV: 1.29 L
TLC % PRED: 87 %
TLC: 4.36 L

## 2018-04-11 MED ORDER — BUDESONIDE-FORMOTEROL FUMARATE 80-4.5 MCG/ACT IN AERO: 2.0000 | INHALATION_SPRAY | Freq: Two times a day (BID) | RESPIRATORY_TRACT | 2 refills | Status: DC

## 2018-04-11 MED ORDER — AMOXICILLIN-POT CLAVULANATE 500-125 MG PO TABS: 1.0000 | ORAL_TABLET | Freq: Two times a day (BID) | ORAL | 0 refills | Status: DC

## 2018-04-11 MED ORDER — FAMOTIDINE 20 MG PO TABS: 20.0000 mg | ORAL_TABLET | Freq: Two times a day (BID) | ORAL | 6 refills | Status: DC

## 2018-04-11 MED FILL — AMOX-CLAV 875-125 MG TABLET: 875-125 | 10 days supply | Qty: 20 | Fill #0

## 2018-04-11 MED FILL — FAMOTIDINE 20 MG TABLET: 20 | 30 days supply | Qty: 60 | Fill #0

## 2018-04-11 NOTE — Assessment & Plan Note (Addendum)
PFT completed today showing no evidence of obstruction. There was some mild mid-flow restriction with reversibility. Mild DLCO defect. Recommend therapeutic trial Symbicort 80, two puffs twice daily. Follow up in 1 month, if no improvement consider HRCT   FVC 2.88 (98%), FEV1 2.45 (103%), Ratio 85, DLCOcor 16.86 (71%)

## 2018-04-11 NOTE — Progress Notes (Signed)
Reviewed, agree 

## 2018-04-11 NOTE — Assessment & Plan Note (Signed)
Cough is mostly dry upper airway. Occ productive with clear-yellow sputum, no clear bacterial signs or symptoms. CXR normal. Cough felt largely d/t rhinitis, GERD and irritable larynx syndrome. Patient was unable to complete voice rest. She has not started Pepcid, will start taking twice daily. Prescribed Augmentin and prednisone per PCP. Recommend voice rest on weekend, avoid coughing/clearing of throat, take cough suppressant twice daily and tessalon perles as needed

## 2018-04-11 NOTE — Patient Instructions (Addendum)
Chest xray was normal Pulmonary function test was largely normal today. There was some mild mid-flow restriction with reversibility. Recommend trial Symbicort inhaler daily  Therapeutic trial  Symbicort 80 two puff twice daily (take every day no matter how you feel)  GERD Pepcid twice daily x 1 months   Cough Try voice rest over a weekend Try your hardest not to cough or clear your throat  Delsym cough syrup twice daily  Tessalon perles as needed    Follow up in 4 weeks with [facility] or Dr. Lake Bells

## 2018-04-11 NOTE — Progress Notes (Signed)
PFT done today. 

## 2018-04-11 NOTE — Progress Notes (Signed)
@Patient  ID: [name], female    DOB: [birth year], 47 y.o.   MRN: [id number]  Chief Complaint  Patient presents with  . Follow-up    cough with clear to yellow mucus, wheezing, SOB with speaking and exertion    Referring provider: No ref. provider found  HPI: 47 year old female, never smoked. No significant PMH. Patient of Dr. Lake Bells, seen for initial consult on 03/14/18 for chronic productive cough since 2017. Dr. Sherrin Daisy did not feel cough was d/t pulmonary process and more likey from allergic rhinitis, GERD and irritable larynx syndrome. Recommended pepcid twice daily x1 month, nasal rinse twice daily, Nasacort and generic Zyrtec. Advised voice rest for 3 days and cough suppressants. CXR was normal. Ordered for PFTs.   04/11/2018 Patient presents today for 1 month follow-up visit. Completed PFTs today which showed no evidence of obstruction or restriction. Mid-flow restriction with reversability. Continues to have cough and sob with exertion. She stopped taking antihistamine because it made her drowsy. She has not yet started Pepcid. She was unable to adhear to voice rest because she works as a Estate manager/land agent. She was prescribed Augmentin and prednisone by PCP which she picked up today.     Allergies  Allergen Reactions  . Dilaudid [Hydromorphone Hcl] Nausea And Vomiting    Immunization History  Administered Date(s) Administered  . Influenza,inj,Quad PF,6+ Mos 03/14/2018    Past Medical History:  Diagnosis Date  . Fibroid tumor     Tobacco History: Social History   Tobacco Use  Smoking Status Never Smoker  Smokeless Tobacco Never Used   Counseling given: Not Answered   Outpatient Medications Prior to Visit  Medication Sig Dispense Refill  . amoxicillin-clavulanate (AUGMENTIN) 500-125 MG tablet Take 1 tablet (500 mg total) by mouth 2 (two) times daily. Take after eating 20 tablet 0  . benzonatate (TESSALON) 200 MG capsule Take 1 capsule (200 mg total) by mouth every 8  (eight) hours as needed for cough. 45 capsule 1  . cyclobenzaprine (FLEXERIL) 10 MG tablet Take 1 tablet (10 mg total) by mouth at bedtime. As needed for muscle spasms 30 tablet 1  . famotidine (PEPCID) 20 MG tablet Take 1 tablet (20 mg total) by mouth 2 (two) times daily. To reduce stomach acid 60 tablet 6  . predniSONE (DELTASONE) 20 MG tablet 2 pills daily x2 days and 1 pill daily x2 days then half pill daily for 4 days 8 tablet 0  . ferrous sulfate 325 (65 FE) MG tablet Take 325 mg by mouth daily with breakfast.    . HYDROcodone-acetaminophen (NORCO) 5-325 MG tablet Take 1 tablet by mouth every 6 (six) hours as needed (for pain). 12 tablet 0   No facility-administered medications prior to visit.     Review of Systems  Review of Systems  Constitutional: Negative.   HENT: Negative.        Hoarseness   Respiratory: Positive for cough and shortness of breath. Negative for apnea, chest tightness, wheezing and stridor.   Cardiovascular: Negative.     Physical Exam  BP 132/84 (BP Location: Left Arm, Cuff Size: Normal)   Pulse 71   Temp 98.1 F (36.7 C)   Ht 5' 3.5" (1.613 m)   Wt 162 lb (73.5 kg)   LMP 03/14/2018   SpO2 100%   BMI 28.25 kg/m  Physical Exam  Constitutional: She is oriented to person, place, and time. She appears well-developed and well-nourished.  HENT:  Head: Normocephalic and atraumatic.  Eyes: Pupils are  equal, round, and reactive to light. EOM are normal.  Neck: Normal range of motion. Neck supple.  Cardiovascular: Normal rate, regular rhythm and normal heart sounds.  No murmur heard. Pulmonary/Chest: Effort normal and breath sounds normal. No respiratory distress. She has no wheezes.  Abdominal: Soft. Bowel sounds are normal. There is no tenderness.  Neurological: She is alert and oriented to person, place, and time.  Skin: Skin is warm and dry. No rash noted. No erythema.  Psychiatric: She has a normal mood and affect. Her behavior is normal. Judgment  normal.     Lab Results:  CBC    Component Value Date/Time   WBC 5.9 02/06/2018 2055   RBC 3.76 (L) 02/06/2018 2055   HGB 10.8 (L) 02/06/2018 2055   HCT 32.3 (L) 02/06/2018 2055   PLT 223 02/06/2018 2055   MCV 85.9 02/06/2018 2055   MCH 28.7 02/06/2018 2055   MCHC 33.4 02/06/2018 2055   RDW 13.4 02/06/2018 2055   LYMPHSABS 1.9 02/06/2018 2055   MONOABS 0.7 02/06/2018 2055   EOSABS 0.1 02/06/2018 2055   BASOSABS 0.0 02/06/2018 2055    BMET    Component Value Date/Time   NA 139 02/06/2018 2055   K 4.4 02/06/2018 2055   CL 109 02/06/2018 2055   CO2 25 02/06/2018 2055   GLUCOSE 87 02/06/2018 2055   BUN 16 02/06/2018 2055   CREATININE 0.79 02/06/2018 2055   CALCIUM 8.8 (L) 02/06/2018 2055   GFRNONAA >60 02/06/2018 2055   GFRAA >60 02/06/2018 2055    BNP No results found for: BNP  ProBNP No results found for: PROBNP  Imaging: Dg Chest 2 View  Result Date: 03/15/2018 CLINICAL DATA:  Patient with chronic productive cough. EXAM: CHEST - 2 VIEW COMPARISON:  Chest radiograph 06/01/2017 FINDINGS: Normal cardiac and mediastinal contours. No consolidative pulmonary opacities. No pleural effusion or pneumothorax. Unremarkable osseous structures. Stable small retrocardiac density which may represent scratch the stable small retrocardiac density which corresponds with cystic lesion demonstrated on prior abdomen pelvic CT within the left lower hemithorax. IMPRESSION: No acute cardiopulmonary process. Electronically Signed   By: Lovey Newcomer M.D.   On: 03/15/2018 09:35   Dg Lumbar Spine Complete  Result Date: 03/14/2018 CLINICAL DATA:  47 year old female with acute lower back pain ongoing since August with numbness and warm sensation down legs. No known injury. Initial encounter. EXAM: LUMBAR SPINE - COMPLETE 4+ VIEW COMPARISON:  05/30/2016 CT. FINDINGS: There is no evidence of lumbar spine fracture. Alignment is normal. Intervertebral disc spaces are maintained. IMPRESSION: Negative.  Electronically Signed   By: Genia Del M.D.   On: 03/14/2018 14:46     Assessment & Plan:   Exertional dyspnea PFT completed today showing no evidence of obstruction. There was some mild mid-flow restriction with reversibility. Mild DLCO defect. Recommend therapeutic trial Symbicort 80, two puffs twice daily. Follow up in 1 month, if no improvement consider HRCT   FVC 2.88 (98%), FEV1 2.45 (103%), Ratio 85, DLCOcor 16.86 (71%)   Cough Cough is mostly dry upper airway. Occ productive with clear-yellow sputum, no clear bacterial signs or symptoms. CXR normal. Cough felt largely d/t rhinitis, GERD and irritable larynx syndrome. Patient was unable to complete voice rest. She has not started Pepcid, will start taking twice daily. Prescribed Augmentin and prednisone per PCP. Recommend voice rest on weekend, avoid coughing/clearing of throat, take cough suppressant twice daily and tessalon perles as needed    Martyn Ehrich, NP 04/11/2018

## 2018-04-11 NOTE — Progress Notes (Signed)
Subjective:    Patient ID: Gail Moreno, female    DOB: Dec 18, 1970, 47 y.o.   MRN: [id number]  HPI 47 year old female seen in follow-up of issues with chronic cough, allergic rhinitis, GERD and chronic low back pain.  Patient did see pulmonology and patient states that she picked up the recommended medications.  Patient states that the allergy medication that she obtained caused her to be drowsy and she stopped taking this medication.  Patient also has stopped taking the reflux medication.  Patient states that she did not feel that she would be able to tolerate the saline nasal spray therefore she did not start this.  Patient does have an appointment later today for pulmonary function test and follow-up with pulmonologist.  Patient states that her cough was slightly better but now she is having more nasal congestion and postnasal drainage.  Patient states that her postnasal drainage has recently been thicker and is starting to be discolored.  Patient states that her cough is now productive of "cold".  Patient denies any fever or chills.  Patient has some occasional facial pressure but no facial pain.  Patient denies any difficulty swallowing but states that last week she was talking and had to stop talking as she felt that she would gag on some mucus that was draining down the back of her throat.      Patient reports that she has continued mid to low back pain with sensation of muscle spasm.  Patient states that she did try the Flexeril but this made her drowsy.  Patient states that she has resumed an exercise program and has found that doing planks does cause her to have increase in back pain.  Patient has also noticed recently that she will develop some numbness along the outside of her left thigh after she has been driving.  Patient also states that she had a fall with an injury to her left knee but the knee pain has resolved.      Patient denies any abdominal pain.  Patient does have some occasional acid  reflux type symptoms such as burping or belching but this did improve after she started the medication.  Patient however is no longer taking the reflux medication.  Patient denies any nausea, no chest pain.  Patient has had no blood in the stool or dark stools.  Patient has some mild fatigue but overall feels okay other than the recurrent cough and back pain. Past Medical History:  Diagnosis Date  . Fibroid tumor    Past Surgical History:  Procedure Laterality Date  . BREAST EXCISIONAL BIOPSY Left    2003 benign  . FOOT SURGERY    . MYRINGOTOMY WITH TUBE PLACEMENT    . SKIN GRAFT    . WISDOM TOOTH EXTRACTION     Family History  Problem Relation Age of Onset  . Hypertension Mother   . Hypertension Father   . Cancer Father        lungs  . Hypertension Sister   . Cancer Sister        pancreatitic  . Diabetes Maternal Aunt   . Diabetes Maternal Uncle    Social History   Tobacco Use  . Smoking status: Never Smoker  . Smokeless tobacco: Never Used  Substance Use Topics  . Alcohol use: Yes    Comment: occasionally  . Drug use: No   Allergies  Allergen Reactions  . Dilaudid [Hydromorphone Hcl] Nausea And Vomiting    Review of Systems  Constitutional:  Positive for fatigue. Negative for chills, diaphoresis and fever.  HENT: Positive for congestion, postnasal drip, rhinorrhea, sinus pressure and sore throat (throat irritation from postnasal drainage). Negative for facial swelling, sinus pain and trouble swallowing.   Respiratory: Positive for cough. Negative for shortness of breath and wheezing.   Cardiovascular: Negative for chest pain, palpitations and leg swelling.  Gastrointestinal: Negative for abdominal pain and nausea.  Endocrine: Negative for polydipsia, polyphagia and polyuria.  Genitourinary: Negative for dysuria and frequency.  Musculoskeletal: Positive for back pain. Negative for arthralgias, gait problem, joint swelling and myalgias.  Neurological: Positive for  numbness (left lateral leg/thigh after driving). Negative for dizziness and headaches.       Objective:   Physical Exam BP 127/86   Pulse 75   Temp 98 F (36.7 C) (Oral)   Resp 18   Ht 5\' 3"  (1.6 m)   Wt 163 lb 3.2 oz (74 kg)   LMP 03/14/2018   SpO2 96%   BMI 28.91 kg/m Nurse's notes and vital signs reviewed General-well-nourished, well-developed female in no acute distress, patient with a mild nasal quality to her voice  ENT- right TM is slightly pink but with visible landmarks and patient with subacute fluid level behind the right TM, left TM is dull.  Patient with moderate edema and erythema of the nasal turbinates with clear to light yellow nasal discharge, patient with posterior pharynx/tonsillar arch edema/erythema as well as cobblestoning and patient with visible thick mucoid whitish postnasal drainage Neck-supple, no lymphadenopathy, no thyromegaly Cardiovascular-regular rate and rhythm Lungs-clear to auscultation bilaterally with mild decrease in breath sounds at the lung bases Abdomen-soft, nontender Back-patient with bilateral thoracolumbar paraspinous spasm and patient with some mild left SI joint discomfort with palpation Extremities-no edema     Assessment & Plan:  1. Chronic cough Patient with continued issues with chronic cough.  Patient did see pulmonology and I reviewed her note from that visit.  Patient however states that she was unable to tolerate the recommended cetirizine due to drowsiness.  I suggested to the patient that she try taking half a pill and of course take this at nighttime.  Patient also stopped use of acid reflux medication therefore prescription sent to her pharmacy today for Pepcid 20 mg twice daily.  Patient does have appointment later today for her pulmonary function test and will then meet with pulmonology.  Patient should also discuss alternate medications regarding antihistamines and nasal spray/nasal saline washes since she states that she did  not attempt to use a nasal saline wash and she has not noticed a real difference with the use of Nasacort.  Pulmonology thought that patient's cough might be due to her combination of allergic rhinitis and acid reflux symptoms.  2. Allergic rhinitis, unspecified seasonality, unspecified trigger I suggested to the patient that she try taking 5 mg ( one half of current 10 mg dose) of the Zyrtec/cetirizine at nighttime to see if she can tolerate this dose to help with the postnasal drainage.  3. Gastroesophageal reflux disease, esophagitis presence not specified New prescription sent in for Pepcid 20 mg twice daily to help with acid reflux symptoms as suggested by Pulmonology and patient was encouraged to be compliant with this medication as well as avoid late night eating and avoidance of known trigger foods. - famotidine (PEPCID) 20 MG tablet; Take 1 tablet (20 mg total) by mouth 2 (two) times daily. To reduce stomach acid  Dispense: 60 tablet; Refill: 6  4. Chronic bilateral low back  pain, unspecified whether sciatica present I discussed with the patient that she may wish to have referral to physical therapy at some point if she has continued back pain or muscle spasm.  Patient may continue to take the Flexeril on an as-needed basis but patient is aware that this medication causes drowsiness and should only be taken at bedtime or if she does not need to leave her home/drive or operate machinery for at least 8 hours after taking the medication.  5. Acute sinusitis, recurrence not specified, unspecified location Patient with increased postnasal drainage and now with discoloration of nasal drainage/discharge.  Prescription provided for Augmentin 500 mg twice daily to take if she develops worsening of her current symptoms, fever, facial pain or frontal headache. - amoxicillin-clavulanate (AUGMENTIN) 500-125 MG tablet; Take 1 tablet (500 mg total) by mouth 2 (two) times daily. Take after eating  Dispense:  20 tablet; Refill: 0  An After Visit Summary was printed and given to the patient.  Return in about 2 months (around 06/11/2018) for cough/back pain/anemia and as needed.

## 2018-04-18 ENCOUNTER — Ambulatory Visit: Payer: Self-pay

## 2018-05-06 MED FILL — BENZONATATE 100 MG CAP: 100 | 15 days supply | Qty: 90 | Fill #0

## 2018-05-10 IMAGING — MG DIGITAL SCREENING BILATERAL MAMMOGRAM WITH CAD
4 series · 4 of 4 positions shown · non-contrast
Comparison: Previous exam(s).

CLINICAL DATA: Screening.

EXAM:
DIGITAL SCREENING BILATERAL MAMMOGRAM WITH CAD

[L MLO]
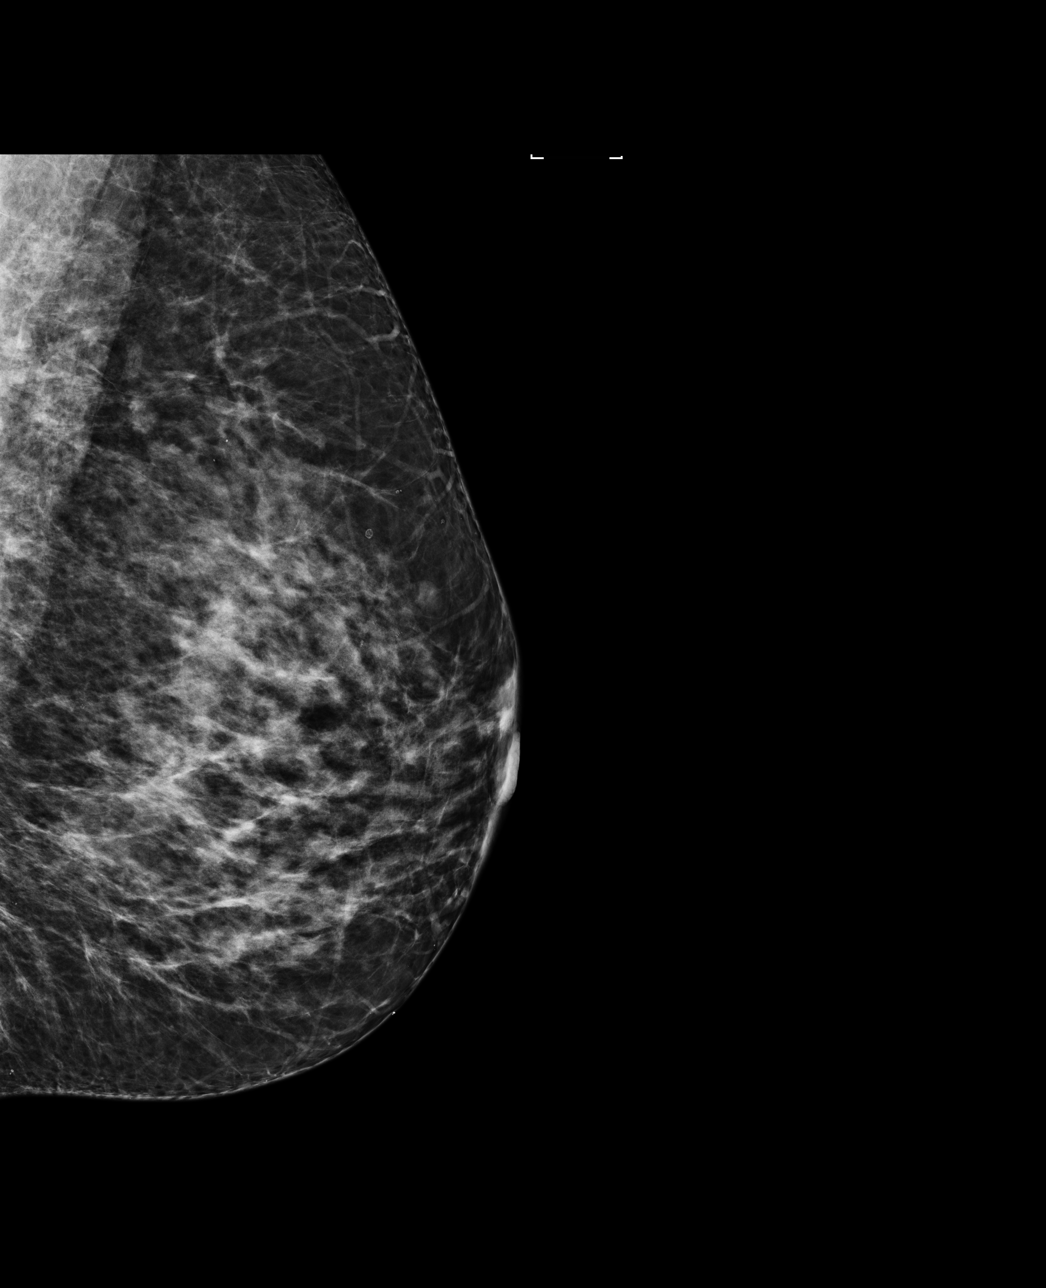

[R MLO]
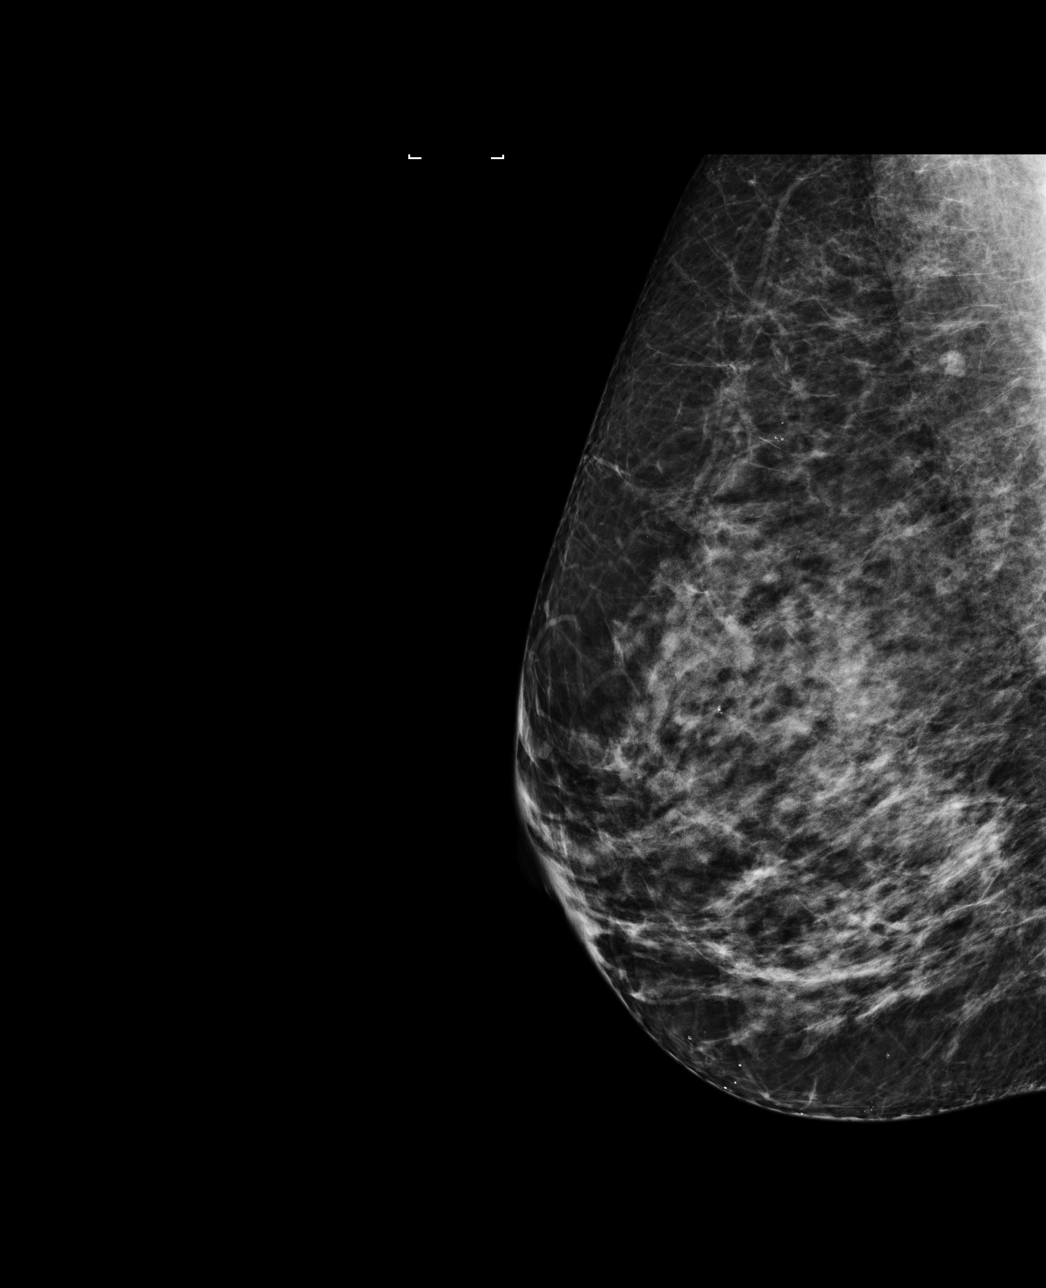

[R CC]
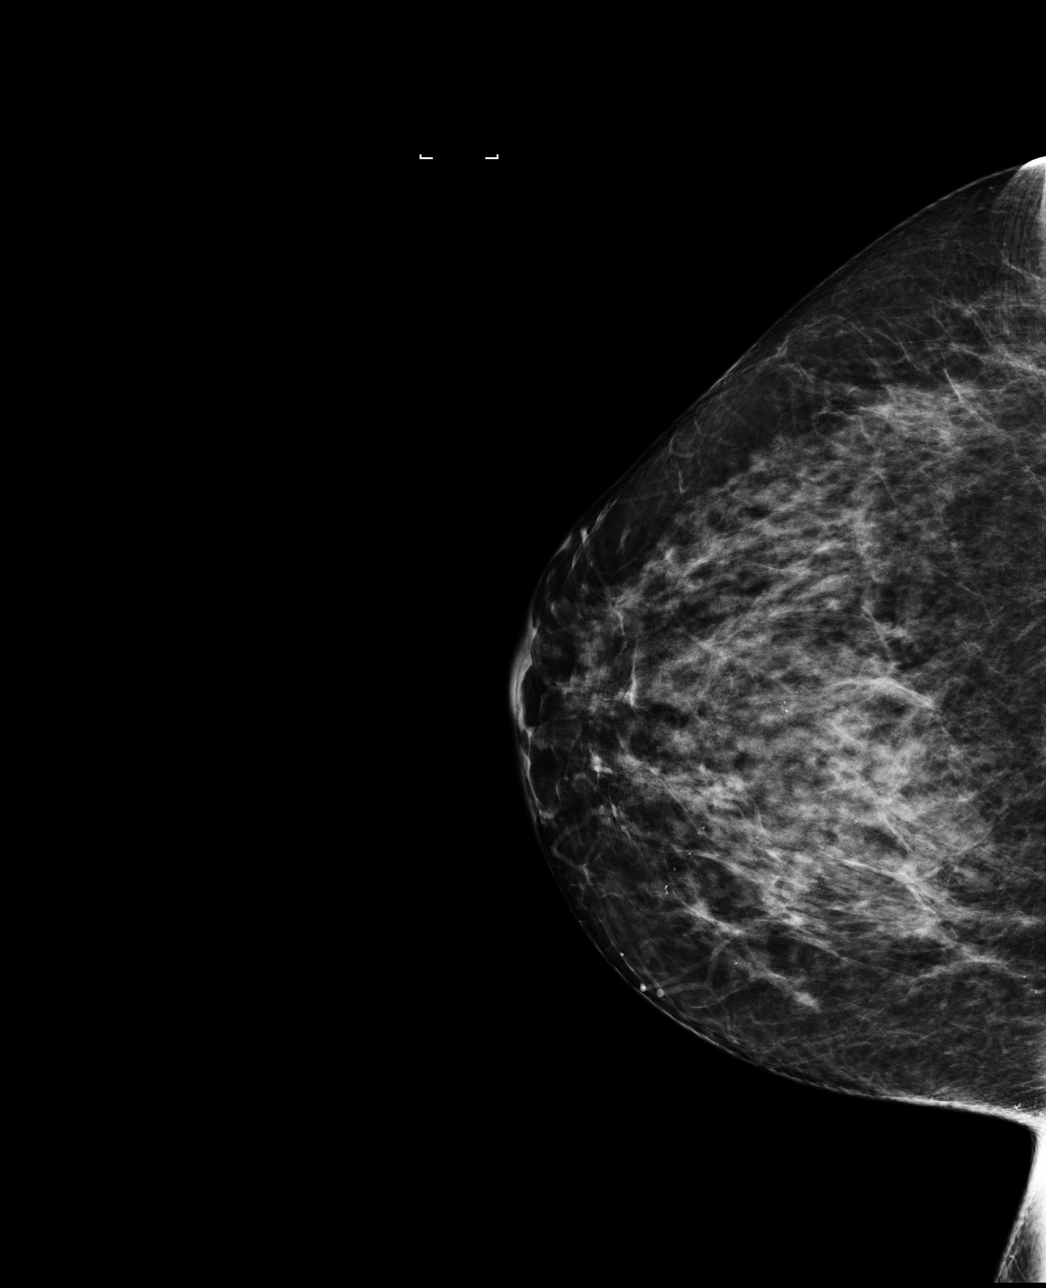

[L CC]
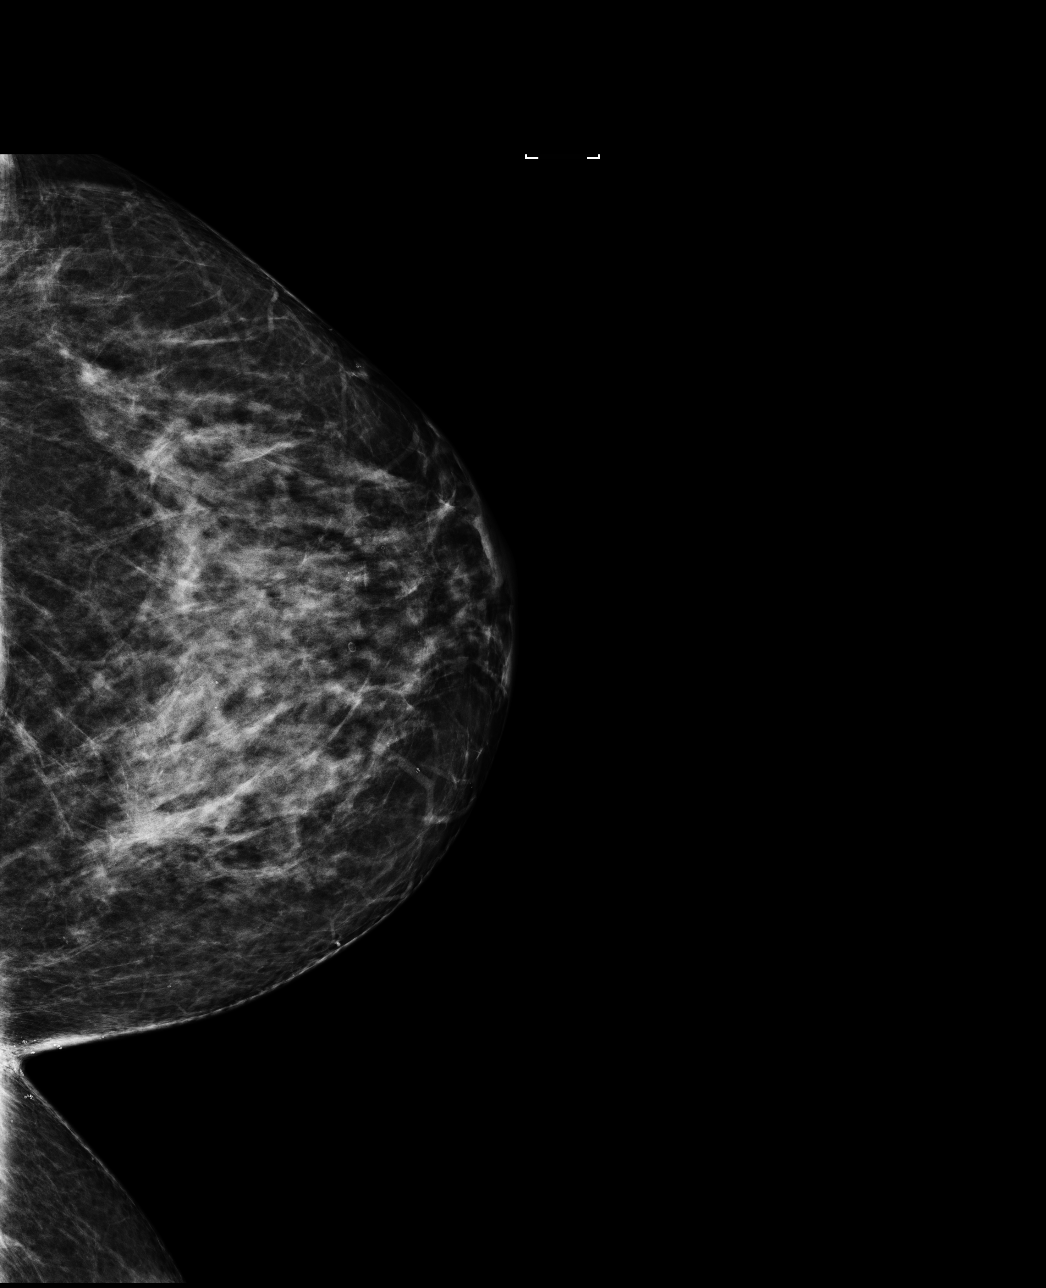

[4 of 4 positions shown; findings below may reference images not displayed]

ACR Breast Density Category c: The breast tissue is heterogeneously
dense, which may obscure small masses.
FINDINGS: There are no findings suspicious for malignancy. Images were
processed with CAD.
IMPRESSION: No mammographic evidence of malignancy. A result letter of this
screening mammogram will be mailed directly to the patient.

RECOMMENDATION:
Screening mammogram in one year. (Code:YJ-[id number])

BI-RADS CATEGORY  1: Negative.

## 2018-05-16 ENCOUNTER — Ambulatory Visit (INDEPENDENT_AMBULATORY_CARE_PROVIDER_SITE_OTHER): Payer: Self-pay | Admitting: Pulmonary Disease

## 2018-05-16 ENCOUNTER — Encounter: Payer: Self-pay | Admitting: Pulmonary Disease

## 2018-05-16 VITALS — BP 118/62 | HR 82 | Ht 64.37 in | Wt 163.6 lb

## 2018-05-16 DIAGNOSIS — K219 Gastro-esophageal reflux disease without esophagitis: Secondary | ICD-10-CM

## 2018-05-16 DIAGNOSIS — R059 Cough, unspecified: Secondary | ICD-10-CM

## 2018-05-16 DIAGNOSIS — R05 Cough: Secondary | ICD-10-CM

## 2018-05-16 DIAGNOSIS — J301 Allergic rhinitis due to pollen: Secondary | ICD-10-CM

## 2018-05-16 MED ORDER — BENZONATATE 200 MG PO CAPS: 200.0000 mg | ORAL_CAPSULE | Freq: Three times a day (TID) | ORAL | 2 refills | Status: DC | PRN

## 2018-05-16 MED ORDER — MONTELUKAST SODIUM 10 MG PO TABS: 10.0000 mg | ORAL_TABLET | Freq: Every day | ORAL | 2 refills | Status: DC

## 2018-05-16 NOTE — Progress Notes (Signed)
Synopsis: Referred in September 2019 for chronic cough, she has allergic rhinitis and GERD  Subjective:   PATIENT ID: Gail Moreno GENDER: female DOB: March 01, 1971, MRN: [id number]   HPI  Chief Complaint  Patient presents with  . Follow-up    continues to cough- dry   Gail Moreno continues to cough.  She says that because of her work schedule she has been unable to pick 3 days of voice rest.  She says that she continues to sing in the choir on Sundays and this typically makes her voice more raspy and then she will cough more.  She continues to feel like there is mucus in her throat.  She says that she takes the antacid about once a day instead of twice a day.  She did not take the cetirizine because it made her too sleepy.  She just filled a prescription for the Tessalon this morning.  She recently started using her Symbicort inhaler again because of wheezing.  She did not take the Nasonex we recommended she take on the last visit to  Past Medical History:  Diagnosis Date  . Fibroid tumor       Review of Systems  Constitutional: Negative for chills, fever, malaise/fatigue and weight loss.  HENT: Positive for congestion and sore throat. Negative for nosebleeds and sinus pain.   Respiratory: Positive for cough and wheezing. Negative for hemoptysis, sputum production and shortness of breath.   Cardiovascular: Negative for chest pain, palpitations, orthopnea and leg swelling.  Genitourinary: Negative for urgency.  Skin: Negative for itching and rash.      Objective:  Physical Exam   Vitals:   05/16/18 1141  BP: 118/62  Pulse: 82  SpO2: 99%  Weight: 163 lb 9.6 oz (74.2 kg)  Height: 5' 4.37" (1.635 m)    Gen: well appearing HENT: OP clear, TM's clear, neck supple PULM: CTA B, normal percussion CV: RRR, no mgr, trace edema GI: BS+, soft, nontender Derm: no cyanosis or rash Psyche: normal mood and affect   CBC    Component Value Date/Time   WBC 5.9 02/06/2018 2055   RBC  3.76 (L) 02/06/2018 2055   HGB 10.8 (L) 02/06/2018 2055   HCT 32.3 (L) 02/06/2018 2055   PLT 223 02/06/2018 2055   MCV 85.9 02/06/2018 2055   MCH 28.7 02/06/2018 2055   MCHC 33.4 02/06/2018 2055   RDW 13.4 02/06/2018 2055   LYMPHSABS 1.9 02/06/2018 2055   MONOABS 0.7 02/06/2018 2055   EOSABS 0.1 02/06/2018 2055   BASOSABS 0.0 02/06/2018 2055     Chest imaging: 05/2017 CXR images independently reviewed: Normal pulmonary parenchyma without infiltrate or abnormality  PFT: November 2019 pulmonary function testing showed no airflow obstruction, normal volumes, DLCO slightly low at 16.9 mL 71% predicted (likely due to anemia)  Labs: 01/2018 CBC Hgb 10.8 gm/dL  Path:  Echo:  Heart Catheterization:  Records from her visit with community wellness from today reviewed, she had a URI and it was recommended she take OTC medicines for this.     Assessment & Plan:   Cough  Allergic rhinitis due to pollen, unspecified seasonality  Gastroesophageal reflux disease, esophagitis presence not specified  Discussion: Gail Moreno does not have asthma nor does she have a lung problem.  I told her today that there is really no need for her to use Symbicort.  She has cough due to an upper airway cough problem from allergic rhinitis, laryngeal irritation from ongoing coughing, and acid reflux.  She has not  really made much effort at voice rest and she has not been compliant with allergy or antacid therapies.  Plan: Gastroesophageal reflux disease: Continue taking Pepcid twice a day no matter how you feel  Allergic rhinitis: We will prescribe montelukast 10 mg daily Try taking Claritin instead of Zyrtec (generic for this is loratadine) Use Nasonex nose spray 2 sprays each nostril  Cough: This is due to your vocal cords being irritated, postnasal drip, and allergic rhinitis. You really need to make effort to get the allergic rhinitis under control so I strongly recommend you take the regimen  above in addition to the acid reflux regimen Once you have been taking this medicine regimen consistently for a week you really need to rest her voice for 2 to 3 days.  Even just 2 days is better than nothing.  During this time.laugh, do not talk, do not sing and suppress your cough every time you feel the need to cough.  Use hard candies and warm beverages to soothe your throat and use the Tessalon Perles I prescribed to help during this time.  We will see you back in 4 to 6 weeks or sooner if need     Current Outpatient Medications:  .  benzonatate (TESSALON) 200 MG capsule, Take 1 capsule (200 mg total) by mouth every 8 (eight) hours as needed for cough., Disp: 45 capsule, Rfl: 2 .  budesonide-formoterol (SYMBICORT) 80-4.5 MCG/ACT inhaler, Inhale 2 puffs into the lungs 2 (two) times daily., Disp: 1 Inhaler, Rfl: 2 .  cyclobenzaprine (FLEXERIL) 10 MG tablet, Take 1 tablet (10 mg total) by mouth at bedtime. As needed for muscle spasms, Disp: 30 tablet, Rfl: 1 .  famotidine (PEPCID) 20 MG tablet, Take 1 tablet (20 mg total) by mouth 2 (two) times daily. To reduce stomach acid, Disp: 60 tablet, Rfl: 6 .  montelukast (SINGULAIR) 10 MG tablet, Take 1 tablet (10 mg total) by mouth at bedtime., Disp: 30 tablet, Rfl: 2

## 2018-05-16 NOTE — Patient Instructions (Signed)
Gastroesophageal reflux disease: Continue taking Pepcid twice a day no matter how you feel  Allergic rhinitis: We will prescribe montelukast 10 mg daily Try taking Claritin instead of Zyrtec (generic for this is loratadine) Use Nasonex nose spray 2 sprays each nostril  Cough: This is due to your vocal cords being irritated, postnasal drip, and allergic rhinitis. You really need to make effort to get the allergic rhinitis under control so I strongly recommend you take the regimen above in addition to the acid reflux regimen Once you have been taking this medicine regimen consistently for a week you really need to rest her voice for 2 to 3 days.  Even just 2 days is better than nothing.  During this time.laugh, do not talk, do not sing and suppress your cough every time you feel the need to cough.  Use hard candies and warm beverages to soothe your throat and use the Tessalon Perles I prescribed to help during this time.  We will see you back in 4 to 6 weeks or sooner if need

## 2018-05-17 ENCOUNTER — Telehealth: Payer: Self-pay | Admitting: Pulmonary Disease

## 2018-05-17 MED ORDER — LORATADINE 10 MG PO TABS: 10.0000 mg | ORAL_TABLET | Freq: Every day | ORAL | 3 refills | Status: DC

## 2018-05-17 MED FILL — ?LORATADINE 10 MG TABS: 10 | 30 days supply | Qty: 30 | Fill #0

## 2018-05-17 NOTE — Telephone Encounter (Signed)
Patient called requesting prescription for loratadine to be sent to [facility].  Prescription sent per Patient request.  Nothing further at this time.  Per 05/16/18 OV-  Allergic rhinitis: We will prescribe montelukast 10 mg daily Try taking Claritin instead of Zyrtec (generic for this is loratadine) Use Nasonex nose spray 2 sprays each nostril

## 2018-05-22 IMAGING — DX DG CHEST 2V
2 series · 2 of 2 positions shown · non-contrast
Comparison: Chest radiograph performed 05/30/2016

CLINICAL DATA: Acute onset of cough.

EXAM:
CHEST  2 VIEW

[chest pa]
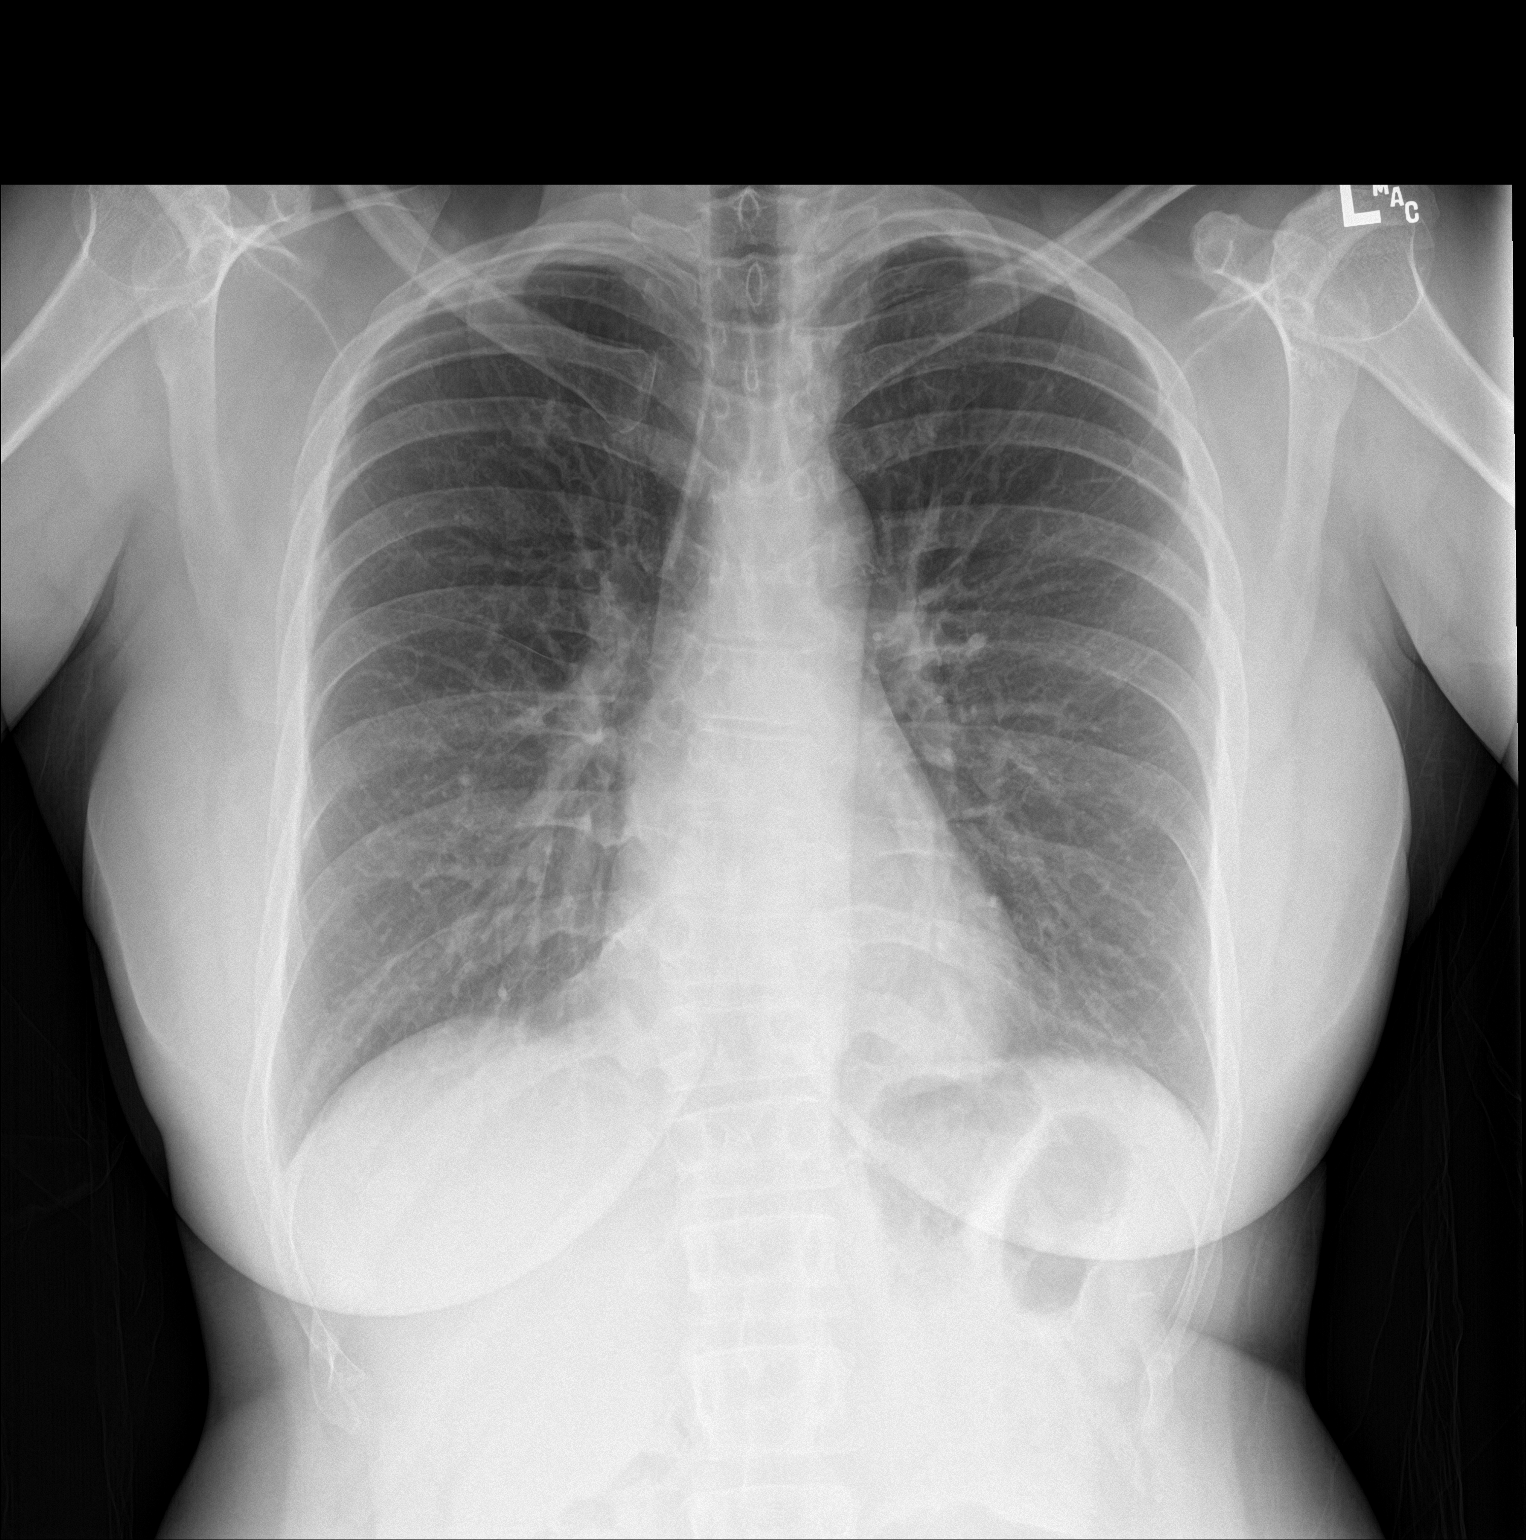

[chest lat]
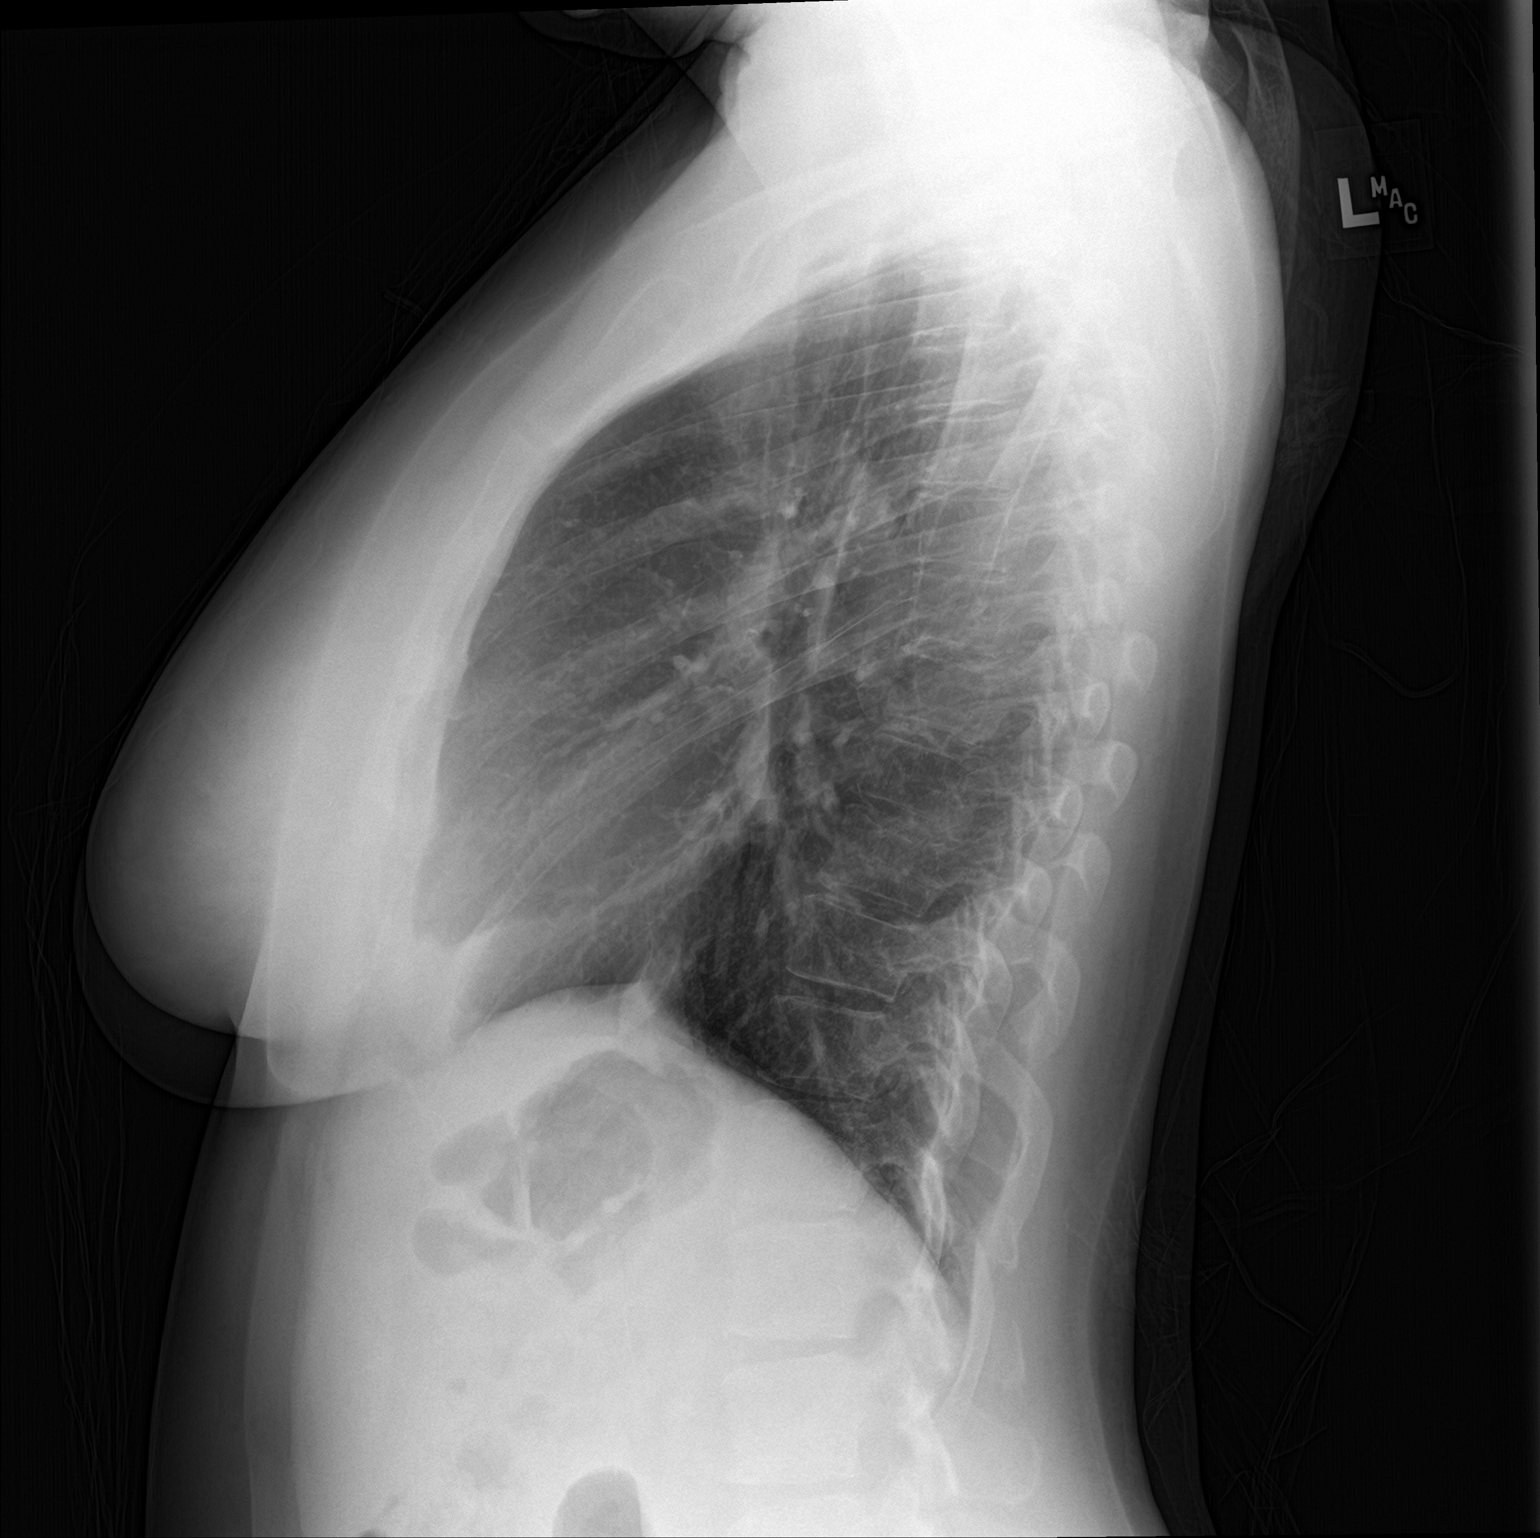

[2 of 2 positions shown; findings below may reference images not displayed]

FINDINGS: The lungs are well-aerated and clear. There is no evidence of focal
opacification, pleural effusion or pneumothorax.

The heart is normal in size; the mediastinal contour is within
normal limits. No acute osseous abnormalities are seen.
IMPRESSION: No acute cardiopulmonary process seen.

## 2018-05-23 ENCOUNTER — Telehealth: Payer: Self-pay | Admitting: Family Medicine

## 2018-05-23 NOTE — Telephone Encounter (Signed)
Patient wants to know about iron therapy if she can start because of how low her iron is. Please call the patient at [phone]

## 2018-05-23 NOTE — Telephone Encounter (Signed)
I did not see where patient has had a recent CBC.  It appears that her last CBC was done back in August.  Patient can take over-the-counter ferrous sulfate 325 mg once daily to help increase her iron.  I would suggest however that patient return for an office or lab visit to have her blood counts rechecked in follow-up of her anemia.

## 2018-05-24 MED FILL — FAMOTIDINE 20 MG TABLET: 20 | 30 days supply | Qty: 60 | Fill #1

## 2018-05-24 NOTE — Telephone Encounter (Signed)
Called and lvm for the patient to call back when she gets a chance to be scheduled for an appointment.

## 2018-05-24 NOTE — Telephone Encounter (Signed)
Please schedule patient of OV to address anemia. Patient may take OTC iron supplements in the mean time.

## 2018-05-30 ENCOUNTER — Ambulatory Visit: Payer: Self-pay | Admitting: Family Medicine

## 2018-06-08 ENCOUNTER — Telehealth: Payer: Self-pay | Admitting: Pulmonary Disease

## 2018-06-08 MED ORDER — BENZONATATE 200 MG PO CAPS: 200.0000 mg | ORAL_CAPSULE | Freq: Three times a day (TID) | ORAL | 2 refills | Status: DC | PRN

## 2018-06-08 NOTE — Telephone Encounter (Signed)
OK 

## 2018-06-08 NOTE — Telephone Encounter (Signed)
Per Dr. Lake Bells- ok for refill of Tessalon.  Refill sent to [facility].  Patient aware.  Nothing further.

## 2018-06-08 NOTE — Telephone Encounter (Signed)
lmtcb for pt.  

## 2018-06-08 NOTE — Telephone Encounter (Signed)
Dr. Lake Bells, please advise if you are okay with [place] refilling med for pt. Thanks!

## 2018-06-08 NOTE — Telephone Encounter (Signed)
Patient returned call, CB is [phone].  States she needs new RX for benzonatate for cough sent to [place].

## 2018-06-13 ENCOUNTER — Ambulatory Visit: Payer: Self-pay | Attending: Family Medicine | Admitting: Family Medicine

## 2018-06-13 ENCOUNTER — Encounter: Payer: Self-pay | Admitting: Family Medicine

## 2018-06-13 VITALS — BP 118/81 | HR 77 | Temp 98.2°F | Resp 18 | Ht 64.0 in

## 2018-06-13 DIAGNOSIS — D509 Iron deficiency anemia, unspecified: Secondary | ICD-10-CM

## 2018-06-13 DIAGNOSIS — Z833 Family history of diabetes mellitus: Secondary | ICD-10-CM | POA: Insufficient documentation

## 2018-06-13 DIAGNOSIS — J309 Allergic rhinitis, unspecified: Secondary | ICD-10-CM

## 2018-06-13 DIAGNOSIS — H6982 Other specified disorders of Eustachian tube, left ear: Secondary | ICD-10-CM

## 2018-06-13 DIAGNOSIS — Z79899 Other long term (current) drug therapy: Secondary | ICD-10-CM | POA: Insufficient documentation

## 2018-06-13 DIAGNOSIS — H6992 Unspecified Eustachian tube disorder, left ear: Secondary | ICD-10-CM

## 2018-06-13 DIAGNOSIS — Z885 Allergy status to narcotic agent status: Secondary | ICD-10-CM | POA: Insufficient documentation

## 2018-06-13 MED FILL — BENZONATATE 100 MG CAP: 100 | 15 days supply | Qty: 90 | Fill #0

## 2018-06-13 NOTE — Progress Notes (Signed)
Subjective:    Patient ID: Gail Moreno, female    DOB: 03-28-1971, 47 y.o.   MRN: [id number]  HPI       47 yo female being seen in follow-up of anemia.  Patient states that prior to having blood work done in August she was taking an iron supplement 3 times daily but she will forget to take doses of this medication.  Patient states that after receiving notification of her labs back in August she decreased her use of iron to once daily.  Patient reports that she is having no nausea/vomiting or constipation and no abdominal pain related to once daily iron use. (Patient with a CBC from 02/06/2018 showing hemoglobin of 10.8 with normal MCV.)  Patient states that she had asked her pulmonologist about receiving a iron transfusion so that she would not have to take iron pills and she was told to follow-up with her primary care provider.  Patient does continue to have regular monthly periods that are generally heavy for the first few days with some passage of blood clots.  Patient denies any current chest pain, shortness of breath or peripheral edema related to anemia but has some mild fatigue.      Patient also with complaint of chronic issues with nasal congestion and patient with recent onset of ear pressure/discomfort especially on the left.  Patient states that she often feels as if she has to pinch her nose and try to blow out while her nose is pinched to help with the pressure in her ear.  Patient states that she recently started taking loratadine as other allergy medications  caused her to feel drowsy.  Past Medical History:  Diagnosis Date  . Fibroid tumor    Past Surgical History:  Procedure Laterality Date  . BREAST EXCISIONAL BIOPSY Left    2003 benign  . FOOT SURGERY    . MYRINGOTOMY WITH TUBE PLACEMENT    . SKIN GRAFT    . WISDOM TOOTH EXTRACTION     Family History  Problem Relation Age of Onset  . Hypertension Mother   . Hypertension Father   . Cancer Father        lungs  .  Hypertension Sister   . Cancer Sister        pancreatitic  . Diabetes Maternal Aunt   . Diabetes Maternal Uncle    Social History   Tobacco Use  . Smoking status: Never Smoker  . Smokeless tobacco: Never Used  Substance Use Topics  . Alcohol use: Yes    Comment: occasionally  . Drug use: No   Allergies  Allergen Reactions  . Dilaudid [Hydromorphone Hcl] Nausea And Vomiting     Review of Systems  Constitutional: Positive for fatigue. Negative for chills and fever.  HENT: Positive for congestion, ear pain (pressure and itching in the left ear), postnasal drip and rhinorrhea. Negative for hearing loss, nosebleeds, sore throat and trouble swallowing.   Respiratory: Negative for cough and shortness of breath.   Cardiovascular: Negative for chest pain, palpitations and leg swelling.  Gastrointestinal: Negative for abdominal pain, constipation and diarrhea.  Genitourinary: Negative for dysuria and frequency.  Musculoskeletal: Negative for back pain and gait problem.  Neurological: Negative for dizziness and headaches.  Hematological: Negative for adenopathy. Does not bruise/bleed easily.       Objective:   Physical Exam BP 118/81 (BP Location: Right Arm, Patient Position: Sitting, Cuff Size: Large)   Pulse 77   Temp 98.2 F (36.8 C) (Oral)  Resp 18   Ht 5\' 4"  (1.626 m)   SpO2 99%   BMI 28.08 kg/m Nurses note and vital signs reviewed General-well-nourished, well-developed female in no acute distress, patient has mild nasal quality to her voice ENT- TMs dull bilaterally, slight retraction of the left TM.  Patient with pale, edematous nasal turbinates/nasal mucosa with clear to white nasal discharge, patient with posterior pharynx/tonsillar arch edema/erythema-mild Neck-supple, no adenopathy Cardiovascular-regular rate and rhythm Lungs-clear to auscultation bilaterally Psych- slightly flattened affect     Assessment & Plan:  1. Iron deficiency anemia, unspecified iron  deficiency anemia type Patient with ongoing issues with iron deficiency anemia which is likely due to chronic blood loss from heavy menses and patient with a history of uterine fibroids.  Patient will have CBC at today's visit along with iron panel.  Patient will be notified regarding the need for iron replacement therapy as patient states that she is currently taking iron once daily.  Patient states that she is interested in iron transfusion but patient was made aware that iron transfusion does carry the risk of an anaphylactic reaction therefore this is usually reserved for people with severe anemia and or very low iron storage despite oral iron therapy or the inability to tolerate oral iron therapy. - CBC with Differential - Iron, TIBC and Ferritin Panel  2. Chronic allergic rhinitis Patient reports that she is currently taking loratadine as other allergy medications caused her to feel sleepy however the loratadine is not very effective for her symptoms.  Patient is encouraged to try addition of nighttime Benadryl or to try children's dose, 5 mg of cetirizine at bedtime to help with nasal congestion.  Patient is also encouraged to restart the use of Flonase and to take for at least a week before deciding whether or not the medication is effective.  3. Eustachian tube dysfunction, left Patient with eustachian tube dysfunction.  Patient is encouraged not to continue pinching her nose in an attempt to help with the eustachian tube dysfunction.  Patient is encouraged to restart her use of Flonase and continue loratadine or try a stronger antihistamine.  If patient with continued issues, patient is encouraged to call for referral to ENT.  An After Visit Summary was printed and given to the patient.  Return in about 5 months (around 11/12/2018) for anemia. Sooner if indicated based on today's labs

## 2018-06-13 NOTE — Patient Instructions (Addendum)
Please try over the counter Children's Zyrtec, 5 mg at bedtime to help with your nasal congestion, ear pressure and postnasal drainage Eustachian Tube Dysfunction  Eustachian tube dysfunction refers to a condition in which a blockage develops in the narrow passage that connects the middle ear to the back of the nose (eustachian tube). The eustachian tube regulates air pressure in the middle ear by letting air move between the ear and nose. It also helps to drain fluid from the middle ear space. Eustachian tube dysfunction can affect one or both ears. When the eustachian tube does not function properly, air pressure, fluid, or both can build up in the middle ear. What are the causes? This condition occurs when the eustachian tube becomes blocked or cannot open normally. Common causes of this condition include:  Ear infections.  Colds and other infections that affect the nose, mouth, and throat (upper respiratory tract).  Allergies.  Irritation from cigarette smoke.  Irritation from stomach acid coming up into the esophagus (gastroesophageal reflux). The esophagus is the tube that carries food from the mouth to the stomach.  Sudden changes in air pressure, such as from descending in an airplane or scuba diving.  Abnormal growths in the nose or throat, such as: ? Growths that line the nose (nasal polyps). ? Abnormal growth of cells (tumors). ? Enlarged tissue at the back of the throat (adenoids). What increases the risk? You are more likely to develop this condition if:  You smoke.  You are overweight.  You are a child who has: ? Certain birth defects of the mouth, such as cleft palate. ? Large tonsils or adenoids. What are the signs or symptoms? Common symptoms of this condition include:  A feeling of fullness in the ear.  Ear pain.  Clicking or popping noises in the ear.  Ringing in the ear.  Hearing loss.  Loss of balance.  Dizziness. Symptoms may get worse when the  air pressure around you changes, such as when you travel to an area of high elevation, fly on an airplane, or go scuba diving. How is this diagnosed? This condition may be diagnosed based on:  Your symptoms.  A physical exam of your ears, nose, and throat.  Tests, such as those that measure: ? The movement of your eardrum (tympanogram). ? Your hearing (audiometry). How is this treated? Treatment depends on the cause and severity of your condition.  In mild cases, you may relieve your symptoms by moving air into your ears. This is called "popping the ears."  In more severe cases, or if you have symptoms of fluid in your ears, treatment may include: ? Medicines to relieve congestion (decongestants). ? Medicines that treat allergies (antihistamines). ? Nasal sprays or ear drops that contain medicines that reduce swelling (steroids). ? A procedure to drain the fluid in your eardrum (myringotomy). In this procedure, a small tube is placed in the eardrum to:  Drain the fluid.  Restore the air in the middle ear space. ? A procedure to insert a balloon device through the nose to inflate the opening of the eustachian tube (balloon dilation). Follow these instructions at home: Lifestyle  Do not do any of the following until your health care provider approves: ? Travel to high altitudes. ? Fly in airplanes. ? Work in a Pension scheme manager or room. ? Scuba dive.  Do not use any products that contain nicotine or tobacco, such as cigarettes and e-cigarettes. If you need help quitting, ask your health care provider.  Keep your ears dry. Wear fitted earplugs during showering and bathing. Dry your ears completely after. General instructions  Take over-the-counter and prescription medicines only as told by your health care provider.  Use techniques to help pop your ears as recommended by your health care provider. These may include: ? Chewing gum. ? Yawning. ? Frequent, forceful  swallowing. ? Closing your mouth, holding your nose closed, and gently blowing as if you are trying to blow air out of your nose.  Keep all follow-up visits as told by your health care provider. This is important. Contact a health care provider if:  Your symptoms do not go away after treatment.  Your symptoms come back after treatment.  You are unable to pop your ears.  You have: ? A fever. ? Pain in your ear. ? Pain in your head or neck. ? Fluid draining from your ear.  Your hearing suddenly changes.  You become very dizzy.  You lose your balance. Summary  Eustachian tube dysfunction refers to a condition in which a blockage develops in the eustachian tube.  It can be caused by ear infections, allergies, inhaled irritants, or abnormal growths in the nose or throat.  Symptoms include ear pain, hearing loss, or ringing in the ears.  Mild cases are treated with maneuvers to unblock the ears, such as yawning or ear popping.  Severe cases are treated with medicines. Surgery may also be done (rare). This information is not intended to replace advice given to you by your health care provider. Make sure you discuss any questions you have with your health care provider. Document Released: 07/05/2015 Document Revised: 09/28/2017 Document Reviewed: 09/28/2017 Elsevier Interactive Patient Education  2019 [employer].

## 2018-06-14 LAB — CBC WITH DIFFERENTIAL/PLATELET
Basophils Absolute: 0 x10E3/uL (ref 0.0–0.2)
Basos: 0 %
EOS (ABSOLUTE): 0.1 x10E3/uL (ref 0.0–0.4)
Eos: 3 %
Hematocrit: 33.6 % — ABNORMAL LOW (ref 34.0–46.6)
Hemoglobin: 10.9 g/dL — ABNORMAL LOW (ref 11.1–15.9)
Immature Grans (Abs): 0 x10E3/uL (ref 0.0–0.1)
Immature Granulocytes: 0 %
Lymphocytes Absolute: 1.2 x10E3/uL (ref 0.7–3.1)
Lymphs: 34 %
MCH: 27.9 pg (ref 26.6–33.0)
MCHC: 32.4 g/dL (ref 31.5–35.7)
MCV: 86 fL (ref 79–97)
Monocytes Absolute: 0.4 x10E3/uL (ref 0.1–0.9)
Monocytes: 10 %
Neutrophils Absolute: 1.9 x10E3/uL (ref 1.4–7.0)
Neutrophils: 53 %
Platelets: 259 x10E3/uL (ref 150–450)
RBC: 3.9 x10E6/uL (ref 3.77–5.28)
RDW: 13.7 % (ref 12.3–15.4)
WBC: 3.6 x10E3/uL (ref 3.4–10.8)

## 2018-06-14 LAB — IRON,TIBC AND FERRITIN PANEL
Ferritin: 20 ng/mL (ref 15–150)
Iron Saturation: 23 % (ref 15–55)
Iron: 80 ug/dL (ref 27–159)
Total Iron Binding Capacity: 354 ug/dL (ref 250–450)
UIBC: 274 ug/dL (ref 131–425)

## 2018-06-16 ENCOUNTER — Telehealth: Payer: Self-pay | Admitting: *Deleted

## 2018-06-16 NOTE — Telephone Encounter (Signed)
-----   Message from Antony Blackbird, MD sent at 06/15/2018 11:36 PM EST ----- Please let patient know that her Hgb is still below normal at 10.9 indicating anemia but by taking her iron supplement she has managed to keep her iron level and iron storage normal

## 2018-06-16 NOTE — Telephone Encounter (Signed)
Medical Assistant left message on patient's home and cell voicemail. Voicemail states to give a call back to [place] with [facility] at [phone]. Patient is aware of hemoglobin being normal and continue with daily iron to keep this count stable.

## 2018-06-20 MED FILL — ?LORATADINE 10 MG TABS: 10 | 30 days supply | Qty: 30 | Fill #1

## 2018-06-21 MED FILL — FAMOTIDINE 20 MG TABLET: 20 | 30 days supply | Qty: 60 | Fill #2

## 2018-06-27 MED FILL — ?FAMOTIDINE 20 MG TABLET: 20 | 30 days supply | Qty: 60 | Fill #2

## 2018-06-28 ENCOUNTER — Ambulatory Visit (INDEPENDENT_AMBULATORY_CARE_PROVIDER_SITE_OTHER): Payer: Self-pay | Admitting: Pulmonary Disease

## 2018-06-28 ENCOUNTER — Encounter: Payer: Self-pay | Admitting: Pulmonary Disease

## 2018-06-28 VITALS — BP 116/68 | HR 87 | Ht 64.0 in | Wt 168.0 lb

## 2018-06-28 DIAGNOSIS — R059 Cough, unspecified: Secondary | ICD-10-CM

## 2018-06-28 DIAGNOSIS — R05 Cough: Secondary | ICD-10-CM

## 2018-06-28 DIAGNOSIS — K219 Gastro-esophageal reflux disease without esophagitis: Secondary | ICD-10-CM

## 2018-06-28 DIAGNOSIS — J301 Allergic rhinitis due to pollen: Secondary | ICD-10-CM

## 2018-06-28 MED ORDER — BENZONATATE 100 MG PO CAPS: 200.0000 mg | ORAL_CAPSULE | Freq: Three times a day (TID) | ORAL | 1 refills | Status: DC | PRN

## 2018-06-28 MED ORDER — MONTELUKAST SODIUM 10 MG PO TABS: 10.0000 mg | ORAL_TABLET | Freq: Every day | ORAL | 2 refills | Status: DC

## 2018-06-28 MED ORDER — BENZONATATE 200 MG PO CAPS: 200.0000 mg | ORAL_CAPSULE | Freq: Three times a day (TID) | ORAL | 1 refills | Status: DC | PRN

## 2018-06-28 MED FILL — BENZONATATE 100 MG CAP: 100 | 15 days supply | Qty: 90 | Fill #0

## 2018-06-28 MED FILL — MONTELUKAST SOD 10 MG TAB: 10 | 30 days supply | Qty: 30 | Fill #0

## 2018-06-28 NOTE — Progress Notes (Signed)
Synopsis: Referred in September 2019 for chronic cough, she has allergic rhinitis and GERD  Subjective:   PATIENT ID: Gail Moreno GENDER: female DOB: 04-27-1971, MRN: [id number]   HPI  Chief Complaint  Patient presents with  . Follow-up    pt c/o stable sometimes prod cough with more mucus production in the morning.  also notes sinus congestion, stuffy nose.    Cough:  > she says that it is a little better > however she still has an itchy feeling in her throat > she sometimes produces some red mucus, typically it is red >   Sinus disease > she still has sinus congestion > she notes thick mucus and producing blood nose mucus > she is taking a [employer] brand cold and flu medicine > she is taking  (I think, she didn't bring her meds and she doesn't remember the names) famotidine  Denies heartburn but has indigestion and regurgitation.     Past Medical History:  Diagnosis Date  . Fibroid tumor       Review of Systems  Constitutional: Negative for chills, fever, malaise/fatigue and weight loss.  HENT: Positive for congestion and sore throat. Negative for nosebleeds and sinus pain.   Respiratory: Positive for cough and wheezing. Negative for hemoptysis, sputum production and shortness of breath.   Cardiovascular: Negative for chest pain, palpitations, orthopnea and leg swelling.  Genitourinary: Negative for urgency.  Skin: Negative for itching and rash.      Objective:  Physical Exam   Vitals:   06/28/18 0856  BP: 116/68  Pulse: 87  SpO2: 100%  Weight: 168 lb (76.2 kg)  Height: 5\' 4"  (1.626 m)    Gen: well appearing HENT: OP clear, TM's clear, neck supple PULM: CTA B, normal percussion CV: RRR, no mgr, trace edema GI: BS+, soft, nontender Derm: no cyanosis or rash Psyche: normal mood and affect    CBC    Component Value Date/Time   WBC 3.6 06/13/2018 1018   WBC 5.9 02/06/2018 2055   RBC 3.90 06/13/2018 1018   RBC 3.76 (L) 02/06/2018 2055   HGB  10.9 (L) 06/13/2018 1018   HCT 33.6 (L) 06/13/2018 1018   PLT 259 06/13/2018 1018   MCV 86 06/13/2018 1018   MCH 27.9 06/13/2018 1018   MCH 28.7 02/06/2018 2055   MCHC 32.4 06/13/2018 1018   MCHC 33.4 02/06/2018 2055   RDW 13.7 06/13/2018 1018   LYMPHSABS 1.2 06/13/2018 1018   MONOABS 0.7 02/06/2018 2055   EOSABS 0.1 06/13/2018 1018   BASOSABS 0.0 06/13/2018 1018     Chest imaging: 05/2017 CXR images independently reviewed: Normal pulmonary parenchyma without infiltrate or abnormality  PFT: November 2019 pulmonary function testing showed no airflow obstruction, normal volumes, DLCO slightly low at 16.9 mL 71% predicted (likely due to anemia)  Labs: 01/2018 CBC Hgb 10.8 gm/dL  Path:  Echo:  Heart Catheterization:  Records from her visit with community [facility] from today reviewed, she had a URI and it was recommended she take OTC medicines for this.     Assessment & Plan:   Cough  Allergic rhinitis due to pollen, unspecified seasonality  Gastroesophageal reflux disease, esophagitis presence not specified  Discussion: She does not have asthma.  She has laryngeal irritation from ongoing postnasal drip and gastroesophageal reflux disease which she has not treated effectively because she does not take the medicines I have prescribed.  I explained to her today that if she took them consistently she should see improvement in  the sinus congestion from allergic rhinitis and postnasal drip causing the cough.  If however she takes the medicines consistently and still has sinus congestion then she needs to see an allergist to solve this problem.  Plan: Allergic rhinitis: Take over-the-counter Claritin, generic okay, 1 pill daily Take montelukast 10 mg daily Take fluticasone 2 sprays each nostril every day no matter how you feel If you still have sinus congestion after doing this consistently for 4 weeks call me and I will send you to an allergist  Cough: You need to treat the  allergic rhinitis as detailed above Take Tessalon as needed for cough Continue taking the famotidine for gastroesophageal reflux disease  Gastroesophageal reflux disease: Take famotidine as prescribed  Follow-up with [place] in 3 to 4 months or sooner if needed     Current Outpatient Medications:  .  cyclobenzaprine (FLEXERIL) 10 MG tablet, Take 1 tablet (10 mg total) by mouth at bedtime. As needed for muscle spasms, Disp: 30 tablet, Rfl: 1 .  famotidine (PEPCID) 20 MG tablet, Take 1 tablet (20 mg total) by mouth 2 (two) times daily. To reduce stomach acid, Disp: 60 tablet, Rfl: 6 .  loratadine (CLARITIN) 10 MG tablet, Take 1 tablet (10 mg total) by mouth daily., Disp: 30 tablet, Rfl: 3 .  montelukast (SINGULAIR) 10 MG tablet, Take 1 tablet (10 mg total) by mouth at bedtime., Disp: 30 tablet, Rfl: 2 .  benzonatate (TESSALON) 100 MG capsule, Take 2 capsules (200 mg total) by mouth 3 (three) times daily as needed for cough., Disp: 90 capsule, Rfl: 1

## 2018-06-28 NOTE — Patient Instructions (Signed)
Allergic rhinitis: Take over-the-counter Claritin, generic okay, 1 pill daily Take montelukast 10 mg daily Take fluticasone 2 sprays each nostril every day no matter how you feel If you still have sinus congestion after doing this consistently for 4 weeks call me and I will send you to an allergist  Cough: You need to treat the allergic rhinitis as detailed above Take Tessalon as needed for cough Continue taking the famotidine for gastroesophageal reflux disease  Gastroesophageal reflux disease: Take famotidine as prescribed  Follow-up with [place] in 3 to 4 months or sooner if needed

## 2018-06-29 ENCOUNTER — Encounter: Payer: Self-pay | Admitting: Family Medicine

## 2018-07-01 ENCOUNTER — Telehealth: Payer: Self-pay | Admitting: Pulmonary Disease

## 2018-07-01 NOTE — Telephone Encounter (Signed)
Spoke with patient. She wanted to clarify if she was still supposed to take loratadine. Advised her that based on her most recent AVS, BQ does want her to continue this medication.   She verbalized understanding. Nothing further needed at time of call.

## 2018-07-01 NOTE — Telephone Encounter (Signed)
Left message for patient to call back  

## 2018-07-01 NOTE — Telephone Encounter (Signed)
Pt is returning call CB# [phone]

## 2018-07-18 MED FILL — ?LORATADINE 10 MG TABS: 10 | 30 days supply | Qty: 30 | Fill #2

## 2018-07-18 MED FILL — BENZONATATE 100 MG CAP: 100 | 15 days supply | Qty: 90 | Fill #1

## 2018-07-25 MED FILL — ?MONTELUKAST SOD 10 MG TAB: 10 | 30 days supply | Qty: 30 | Fill #1

## 2018-07-27 ENCOUNTER — Other Ambulatory Visit (HOSPITAL_COMMUNITY): Payer: Self-pay | Admitting: *Deleted

## 2018-07-27 DIAGNOSIS — N63 Unspecified lump in unspecified breast: Secondary | ICD-10-CM

## 2018-08-01 ENCOUNTER — Telehealth: Payer: Self-pay | Admitting: Pulmonary Disease

## 2018-08-01 MED FILL — BENZONATATE 100 MG CAP: 100 | 15 days supply | Qty: 90 | Fill #1

## 2018-08-01 NOTE — Telephone Encounter (Signed)
Patient already has a refill at the pharmacy nothing further needed at this time.

## 2018-08-10 MED FILL — BENZONATATE 100 MG CAP: 100 | 15 days supply | Qty: 90 | Fill #2

## 2018-08-10 MED FILL — ?LORATADINE 10 MG TABS: 10 | 30 days supply | Qty: 30 | Fill #3

## 2018-08-10 MED FILL — FAMOTIDINE 20 MG TABLET: 20 | 30 days supply | Qty: 60 | Fill #3

## 2018-08-26 MED FILL — MONTELUKAST SOD 10 MG TAB: 10 | 30 days supply | Qty: 30 | Fill #2

## 2018-09-05 ENCOUNTER — Telehealth: Payer: Self-pay | Admitting: Pulmonary Disease

## 2018-09-05 NOTE — Telephone Encounter (Signed)
Left message for patient to call back  

## 2018-09-06 MED ORDER — BENZONATATE 200 MG PO CAPS: 200.0000 mg | ORAL_CAPSULE | Freq: Three times a day (TID) | ORAL | 1 refills | Status: DC | PRN

## 2018-09-06 NOTE — Telephone Encounter (Signed)
Spoke with patient and advised her of TPs recs. She stated that the OTC medications are not working and that she has been trying them for the past 4 weeks. She stated that she wanted to know BQ's recs. Explained to her that BQ is not here today, she stated that she was ok with waiting until he returned to clinic.   BQ, please advise. Thanks!

## 2018-09-06 NOTE — Telephone Encounter (Signed)
Agree with TP recommendations in full

## 2018-09-06 NOTE — Telephone Encounter (Signed)
Primary Pulmonologist: McQuaid Last office visit and with whom: 06/28/18, BQ What do we see them for (pulmonary problems): Cough,allergic rhinitis to pollen Was appointment offered to patient (explain)?  No per office protocol   Reason for call:   Patient stated that she continues to cough. Patient stated that she feels like it is gradually worse.  Patient stated that cough is mostly non productive, but she does gag at times.Patient stated that she has continued taking famotidine, twice daily, loratadine daily, and montelukast daily.  Patient stated that she is out of tessalon.  Patient stated that she had taken something OTC, but was unsure what it was.  Patient stated that she stopped it, because it made her sleepy.  Patient denies travel, being around anyone sick, or any other symptoms.   Message routed to Alvira Monday, NP to advise

## 2018-09-06 NOTE — Telephone Encounter (Signed)
Called and spoke with patient, advised her of BQ response. Medication sent in. Nothing further needed.

## 2018-09-06 NOTE — Telephone Encounter (Signed)
Spoke with patient but she was in the middle of checking out at a store. Will call her back in a few minutes.

## 2018-09-06 NOTE — Telephone Encounter (Signed)
Would recommend Delsym 2 tsp Twice daily  For cough As needed   Tessalon 200mg  Three times a day  For cough As needed  (#45 , 3 refills )  Add Flonase 2 puff daily (otc)  Change Claritin to Allegra 180mg  daily in am (otc )  Begin Chlortrimeton 4mg  At bedtime  . (otc )  If not improving in next 1-2 weeks with will need ov to evaluate further .   Please contact office for sooner follow up if symptoms do not improve or worsen or seek emergency care

## 2018-09-07 ENCOUNTER — Telehealth: Payer: Self-pay | Admitting: Pulmonary Disease

## 2018-09-07 MED ORDER — BENZONATATE 200 MG PO CAPS: 200.0000 mg | ORAL_CAPSULE | Freq: Three times a day (TID) | ORAL | 1 refills | Status: DC | PRN

## 2018-09-07 MED FILL — BENZONATATE 100 MG CAP: 100 | 10 days supply | Qty: 60 | Fill #0

## 2018-09-07 NOTE — Telephone Encounter (Signed)
Called and spoke with patient she is needing her tessalon perles [facility]. Order sent. Nothing further needed.

## 2018-09-19 ENCOUNTER — Telehealth (HOSPITAL_COMMUNITY): Payer: Self-pay | Admitting: *Deleted

## 2018-09-19 NOTE — Telephone Encounter (Signed)
Telephoned patient at home number. Confirmed appointment for March 31. No symptoms of COVID-19. No travel outside of [place] in the last 14 days. No contact with someone with a confirmed diagnosis of COVID-19   

## 2018-09-20 ENCOUNTER — Ambulatory Visit (HOSPITAL_COMMUNITY)
Admission: RE | Admit: 2018-09-20 | Discharge: 2018-09-20 | Disposition: A | Discharge status: Home / Self Care | Payer: Self-pay | Source: Ambulatory Visit | Attending: Obstetrics and Gynecology | Admitting: Obstetrics and Gynecology

## 2018-09-20 ENCOUNTER — Encounter (HOSPITAL_COMMUNITY): Payer: Self-pay

## 2018-09-20 ENCOUNTER — Ambulatory Visit: Payer: Self-pay

## 2018-09-20 ENCOUNTER — Other Ambulatory Visit: Payer: Self-pay

## 2018-09-20 ENCOUNTER — Other Ambulatory Visit: Payer: Self-pay | Admitting: Obstetrics and Gynecology

## 2018-09-20 VITALS — BP 110/68 | Temp 98.4°F | Wt 171.0 lb

## 2018-09-20 DIAGNOSIS — Z1239 Encounter for other screening for malignant neoplasm of breast: Secondary | ICD-10-CM

## 2018-09-20 DIAGNOSIS — Z1231 Encounter for screening mammogram for malignant neoplasm of breast: Secondary | ICD-10-CM

## 2018-09-20 NOTE — Progress Notes (Addendum)
Patient stated she has a right breast lump since 2012. Patient stated it has decreased in size and she is unable to feel it. Patients last mammogram 05/20/2017 was negative and a screening mammogram recommended in one year.  Pap Smear: Pap smear not completed today. Last Pap smear was 06/07/2017 at [facility] and normal with negative HPV. Patient has a history of four abnormal Pap smears. Patient had an abnormal Pap smear 03/18/2016 that was ASCUS with negative HPV, 06/08/2012 that was LGSIL with positive HPV that a colposcopy was completed for follow-up, 07/13/2012 that cryotherapy was completed for follow-up, and 03/05/2011 that cryotherapy was completed for follow-up. Unable to complete patients Pap smear today since patient is currently on her menstrual period. Will call patient to schedule Pap smear in either [facility] clinic or at a free cervical cancer screening. Last three Pap smear results are in Epic.  Physical exam: Breasts Breasts symmetrical. No skin abnormalities bilateral breasts. No nipple retraction bilateral breasts. No nipple discharge bilateral breasts. No lymphadenopathy. No lumps palpated bilateral breasts. No complaints of pain or tenderness on exam. Referred patient to the [facility] for a screening mammogram. Appointment scheduled for Monday, November 20, 2017 at 1020.        Pelvic/Bimanual No Pap smear completed today since patient is currently on her menstrual period.  Smoking History: Patient has never smoked.  Patient Navigation: Patient education provided. Access to services provided for patient through [facility] program.   Breast and Cervical Cancer Risk Assessment: Patient has no family history of breast cancer, known genetic , or radiation treatment to the chest before age 62. Patient has a history of cervical dysplasia. Patient has no history of being immunocompromised, or DES exposure in-utero.  Risk Assessment    Risk Scores    09/20/2018   Last edited by: Armond Hang, LPN   5-year risk: 0.8 %   Lifetime risk: 7 %

## 2018-09-20 NOTE — Addendum Note (Signed)
Encounter addended by: Loletta Parish, RN on: 09/20/2018 1:55 PM  Actions taken: Clinical Note Signed

## 2018-09-20 NOTE — Patient Instructions (Signed)
Explained breast self awareness with [name]. Unable to complete patients Pap smear today due to patient is currently on her menstrual period. Will call patient to schedule an appointment with [facility] or at a free cervical cancer screening. Referred patient to the [facility] for a screening mammogram. Appointment scheduled for Monday, November 20, 2017 at 1020. Patient aware of appointment and will be there. Let patient know the [facility] will follow up with her within the next couple weeks with results of mammogram by letter or phone. [name] verbalized understanding.  Wyeth Hoffer, Arvil Chaco, RN 12:49 PM

## 2018-09-21 MED FILL — BENZONATATE 100 MG CAP: 100 | 10 days supply | Qty: 60 | Fill #1

## 2018-10-03 ENCOUNTER — Ambulatory Visit: Payer: Self-pay | Admitting: Pulmonary Disease

## 2018-10-31 ENCOUNTER — Encounter (HOSPITAL_COMMUNITY): Payer: Self-pay | Admitting: *Deleted

## 2018-11-21 ENCOUNTER — Ambulatory Visit
Admission: RE | Admit: 2018-11-21 | Discharge: 2018-11-21 | Disposition: A | Discharge status: Home / Self Care | Payer: No Typology Code available for payment source | Source: Ambulatory Visit | Attending: Obstetrics and Gynecology | Admitting: Obstetrics and Gynecology

## 2018-11-21 ENCOUNTER — Other Ambulatory Visit: Payer: Self-pay

## 2018-11-21 DIAGNOSIS — Z1231 Encounter for screening mammogram for malignant neoplasm of breast: Secondary | ICD-10-CM

## 2019-01-26 ENCOUNTER — Ambulatory Visit (HOSPITAL_COMMUNITY)
Admission: RE | Admit: 2019-01-26 | Discharge: 2019-01-26 | Disposition: A | Discharge status: Home / Self Care | Payer: No Typology Code available for payment source | Source: Ambulatory Visit | Attending: Obstetrics and Gynecology | Admitting: Obstetrics and Gynecology

## 2019-01-26 ENCOUNTER — Encounter (HOSPITAL_COMMUNITY): Payer: Self-pay | Admitting: *Deleted

## 2019-01-26 ENCOUNTER — Encounter (HOSPITAL_COMMUNITY): Payer: Self-pay

## 2019-01-26 DIAGNOSIS — Z01419 Encounter for gynecological examination (general) (routine) without abnormal findings: Secondary | ICD-10-CM | POA: Insufficient documentation

## 2019-01-26 NOTE — Progress Notes (Signed)
No complaints today.   Pap Smear: Pap smear completed today. Last Pap smear was 06/07/2017 at [facility] and normal with negative HPV. Patient has a history of four abnormal Pap smears. Patient had an abnormal Pap smear 03/18/2016 that was ASCUS with negative HPV, 06/08/2012 that was LGSIL with positive HPV that a colposcopy was completed for follow-up, 07/13/2012 that cryotherapy was completed for follow-up, and 03/05/2011 that cryotherapy was completed for follow-up. Last three Pap smear results are in Epic.  Pelvic/Bimanual   Ext Genitalia No lesions, no swelling and no discharge observed on external genitalia.         Vagina Vagina pink and normal texture. No lesions and white frothy discharge observed in vagina. Wet prep completed.        Cervix Cervix is present. Cervix pink and of normal texture. White frothy discharge observed on cervix.    Uterus Uterus is present and palpable. Uterus in normal position and normal size.        Adnexae Bilateral ovaries present and palpable. No tenderness on palpation.         Rectovaginal No rectal exam completed today since patient had no rectal complaints. No skin abnormalities observed on exam.    Smoking History: Patient has never smoked.  Patient Navigation: Patient education provided. Access to services provided for patient through [facility] program.   Breast and Cervical Cancer Risk Assessment: Patient has no family history of breast cancer, known genetic mutations, or radiation treatment to the chest before age 56. Patient has a history of cervical dysplasia. Patient has no history of being immunocompromised or DES exposure in-utero.  Risk Assessment    Risk Scores      01/26/2019 09/20/2018   Last edited by: Armond Hang, LPN Rolena Infante H, LPN   5-year risk: 0.8 % 0.8 %   Lifetime risk: 7 % 7 %

## 2019-01-26 NOTE — Addendum Note (Signed)
Encounter addended by: Loletta Parish, RN on: 01/26/2019 11:51 AM  Actions taken: Clinical Note Signed

## 2019-01-26 NOTE — Patient Instructions (Signed)
Explained breast self awareness with [name]. Let patient know if today's Pap smear is normal that her next Pap smear is due in one year due to her history of abnormal Pap smears. Let patient know will follow up with her within the next week with results of Pap smear and wet prep by phone. [name] verbalized understanding.  [place], Arvil Chaco, RN 11:48 AM

## 2019-01-30 LAB — CERVICOVAGINAL ANCILLARY ONLY
Candida vaginitis: NEGATIVE
Chlamydia: NEGATIVE
Neisseria Gonorrhea: NEGATIVE
Trichomonas: NEGATIVE

## 2019-01-30 LAB — CYTOLOGY - PAP
Diagnosis: NEGATIVE
HPV: NOT DETECTED

## 2019-02-01 ENCOUNTER — Other Ambulatory Visit (HOSPITAL_COMMUNITY): Payer: Self-pay | Admitting: *Deleted

## 2019-02-01 ENCOUNTER — Telehealth (HOSPITAL_COMMUNITY): Payer: Self-pay | Admitting: *Deleted

## 2019-02-01 MED ORDER — METRONIDAZOLE 500 MG PO TABS: 500.0000 mg | ORAL_TABLET | Freq: Two times a day (BID) | ORAL | 0 refills | Status: DC

## 2019-02-01 MED FILL — metroNIDAZOLE 500 MG TABS: 500 | 7 days supply | Qty: 14 | Fill #0

## 2019-02-01 NOTE — Telephone Encounter (Signed)
Telephoned patient at home number and advised patient of negative pap smear results. HPV was negative. Next pap smear due in one year. Advised patient wet prep did show bacterial vaginosis. Medication was called into [facility]. Patient will pick up today. Advised patient to finish all medication and no alcohol while taking medication. Patient voiced understanding.

## 2019-02-01 NOTE — Progress Notes (Signed)
Patient has been notified

## 2019-02-01 NOTE — Telephone Encounter (Signed)
-----   Message from Loletta Parish, RN sent at 02/01/2019  7:44 AM EDT ----- [name],  Will you enter the orders this morning? Do you need me to call or can you call her today?  Thanks, [employer]

## 2019-02-03 ENCOUNTER — Telehealth: Payer: Self-pay | Admitting: Pulmonary Disease

## 2019-02-03 NOTE — Telephone Encounter (Signed)
LMTCB

## 2019-02-03 NOTE — Telephone Encounter (Signed)
I am sorry that the patient feels that we are supposed to refer her to an allergist.  It looks like back in January/2020 the last time she was seen in our office Dr. Lake Bells suggested that if she continues to have flares despite taking all of her medications as prescribed then she could be referred to an allergist.  Is the patient taking her medications as prescribed?  Please also let the patient know that Dr. Lake Bells is no longer seeing patients in the clinic.  He is working full-time in [employer].  We are transitioning his patients to other pulmonologist in our office.  Please establish the patient with Dr. Carlis Abbott or Dr. Loanne Drilling over the next 4 weeks.  This will be a 30-minute visit as this is a new patient to them.  They can further evaluate and decide if patient does not fact need an allergist referral.  If patient declines this offer.  Patient can follow-up with primary care and see if they would refer her to an allergist.  Wyn Quaker, FNP

## 2019-02-03 NOTE — Telephone Encounter (Signed)
Spoke with pt, she states she is awaiting a referral to see an allergist. According to her chart, she was never referred to an allergist. She states the cough is getting worse and she is starting to gag again. Can we sent a referral to an allergist? Aaron Edelman can we send referral, I don't see any indication that BQ wanted her to see an allergist.

## 2019-02-06 NOTE — Telephone Encounter (Signed)
Called and spoke with pt and stated to her the info from [facility]. Asked pt if she was still taking her meds as prescribed and pt said that she has not been taking the singulair, claritin, or pepcid.  Pt said that she just started back on the tessalon 4 days ago due to beginning to cough too much.  Stated to pt that we could either get her established with a new pulmologist due to BQ working full time at [facility] [employer] or she could contact PCP to see if they could refer her to an allergist and pt said that she would come in to our office for an appt. Pt has been scheduled appt with Dr. Carlis Abbott 9/10 at [facility]. Nothing further needed.

## 2019-02-07 ENCOUNTER — Telehealth: Payer: Self-pay | Admitting: Family Medicine

## 2019-02-07 ENCOUNTER — Other Ambulatory Visit: Payer: Self-pay | Admitting: Family Medicine

## 2019-02-07 DIAGNOSIS — R058 Other specified cough: Secondary | ICD-10-CM

## 2019-02-07 DIAGNOSIS — R05 Cough: Secondary | ICD-10-CM

## 2019-02-07 NOTE — Telephone Encounter (Signed)
Orders aren't needed any longer for COVID testing. Patient was given locations and times places where she can go for testing. No available appointment for PCP for televisit.  Patient verbalized understanding.  Scheduled an appointment for f/u with PCP in Sept. 17 at 0830.

## 2019-02-07 NOTE — Telephone Encounter (Signed)
Harle Battiest or Carilyn Goodpasture- I will placed an order for a COVID test and if one of you could contact patient with steps to come to the clinic parking lot for testing

## 2019-02-07 NOTE — Progress Notes (Signed)
Patient ID: Gail Moreno, female   DOB: 1971/03/05, 48 y.o.   MRN: [id number]   Phone message received from patient requesting COVID testing due to a recurrent cough that she has had since 2017. Order will be placed and patient will be notified as to how to have drive-up COVID testing at this office.

## 2019-02-07 NOTE — Telephone Encounter (Signed)
Pt would like to get tested for covid..she states she doesn't have any symptoms but she's had a cough since 2017 but would still like to get tested..please follow up

## 2019-02-08 ENCOUNTER — Other Ambulatory Visit: Payer: Self-pay

## 2019-02-08 ENCOUNTER — Other Ambulatory Visit: Payer: Self-pay | Admitting: Obstetrics and Gynecology

## 2019-02-08 ENCOUNTER — Inpatient Hospital Stay: Payer: Self-pay | Attending: Obstetrics and Gynecology | Admitting: *Deleted

## 2019-02-08 VITALS — BP 108/64 | Temp 97.1°F | Ht 64.75 in | Wt 162.0 lb

## 2019-02-08 DIAGNOSIS — Z0000 Encounter for general adult medical examination without abnormal findings: Secondary | ICD-10-CM

## 2019-02-08 NOTE — Progress Notes (Signed)
[facility] initial screening   Clinical Measurement:  Height: 64.75 in Weight: 162 lb  Blood Pressure: 112/76  Blood Pressure #2: 108/64 Fasting Labs Drawn Today, will review with patient when they result.   Medical History:  Patient states that she does not have a history of high cholesterol, high blood pressure or diabetes.  Medications:  Patient states that she does not take medication to lower cholesterol, blood pressure or blood sugar.  Patient does not take an aspirin a day to help prevent a heart attack or stroke.    Blood pressure, self measurement: Patient states that she does not measure blood pressure from home and has not been told to do so by a healthcare provider.   Nutrition: Patient states that on average she eats 0 cups of fruit and 1 cup of vegetables per day. Patient states that she does not eat fish at least 2 times per week. Patient eats less than half servings of whole grains. Patient does not drink less than 36 ounces of beverages with added sugar weekly. Patient is not currently watching sodium or salt intake. In the past 7 days patient has not had any drinks containing alcohol. On average day that patient drinks alcohol she drinks 1 alcoholic drink.   Physical activity:  Patient states that she gets 175 minutes of moderate and 0 minutes of vigorous physical activity each week.  Smoking status:  Patient states that she has never smoked tobacco.   Quality of life:  Over the past 2 weeks patient states that she has had several days where she has little interest or pleasure in doing things and 0 days where she has felt down, depressed or hopeless.    Risk reduction and counseling:   [facility] with patient today about adding more fruits and vegetables in diet. Explained that the recommendation is for 2 fruits and 3 vegetables per day. We also talked about reducing the amount of sodas that she drinks. Patient stated that she currently drinks about 7 sodas a week.  Encouraged patient to try and reduce the amount she drinks and substitute for fruit infused water. We also talked about watching salt intake. Patient states that she likes to eat salty foods. Encouraged patient to watch sodium intake.  Navigation:  I will notify patient of lab results.  Patient is aware of 2 more health coaching sessions and a follow up.  Time: 25 minutes

## 2019-02-09 ENCOUNTER — Telehealth: Payer: Self-pay

## 2019-02-09 LAB — LIPID PANEL W/O CHOL/HDL RATIO
Cholesterol, Total: 135 mg/dL (ref 100–199)
HDL: 48 mg/dL (ref >39)
LDL Calculated: 76 mg/dL (ref 0–99)
Triglycerides: 54 mg/dL (ref 0–149)
VLDL Cholesterol Cal: 11 mg/dL (ref 5–40)

## 2019-02-09 LAB — GLUCOSE, RANDOM: Glucose: 90 mg/dL (ref 65–99)

## 2019-02-09 LAB — HGB A1C W/O EAG: Hgb A1c MFr Bld: 5.7 % — ABNORMAL HIGH (ref 4.8–5.6)

## 2019-02-09 NOTE — Telephone Encounter (Signed)
Patient called to ask about what foods I recommended her to try. Encouraged patient to try and add more fruits and vegetables in diet. Also encouraged patient to try and add whole grains in diet. As well as reducing the amount of sodas she consumes and watching the amount of salt she consumes. Patient voiced understanding. Informed her that I would call her next week to go over her lab results.

## 2019-02-13 ENCOUNTER — Encounter: Payer: Self-pay | Admitting: Family Medicine

## 2019-02-13 ENCOUNTER — Telehealth: Payer: Self-pay | Admitting: Family Medicine

## 2019-02-13 NOTE — Telephone Encounter (Signed)
She can take over the counter medication such as Tylenol for headache, fever and bodyaches. She can take over the counter medications for cough and congestion. If she is having chest pain, shortness of breath or is not feeling well then she should go to the ED or urgent care. Also in future, please have triage nurse/RN contact patients who are requesting to speak to the nurse so that patient's can be instructed to go to urgent care or ED if needed based on symptoms.

## 2019-02-13 NOTE — Telephone Encounter (Signed)
New Message   Pt states she is having a cough, sore throat and a fever that keeps going up 99.7, 100.6 and now 101.5. Pt is concerned. I scheduled an appt for 02/16/2019 at 8:30 but pt would like to speak to a nurse

## 2019-02-13 NOTE — Telephone Encounter (Signed)
Patient want's to know if she should take any medications or not. Patient would like to know what to do. Per pt she got tested for Covid 02-09-19. Per pt, the Covid test was done at [facility].

## 2019-02-14 NOTE — Telephone Encounter (Signed)
Spoke with patient and informed her with what provider stated and she verbalized understanding.

## 2019-02-15 ENCOUNTER — Other Ambulatory Visit: Payer: Self-pay | Admitting: Physician Assistant

## 2019-02-15 ENCOUNTER — Telehealth: Payer: Self-pay

## 2019-02-15 ENCOUNTER — Telehealth: Payer: Self-pay | Admitting: Family Medicine

## 2019-02-15 ENCOUNTER — Ambulatory Visit: Payer: Self-pay | Attending: Internal Medicine | Admitting: Physician Assistant

## 2019-02-15 ENCOUNTER — Other Ambulatory Visit: Payer: Self-pay

## 2019-02-15 DIAGNOSIS — R05 Cough: Secondary | ICD-10-CM

## 2019-02-15 DIAGNOSIS — R110 Nausea: Secondary | ICD-10-CM

## 2019-02-15 DIAGNOSIS — R058 Other specified cough: Secondary | ICD-10-CM

## 2019-02-15 MED ORDER — ONDANSETRON HCL 8 MG PO TABS: 8.0000 mg | ORAL_TABLET | Freq: Three times a day (TID) | ORAL | 0 refills | Status: DC | PRN

## 2019-02-15 MED ORDER — BENZONATATE 200 MG PO CAPS: 200.0000 mg | ORAL_CAPSULE | Freq: Three times a day (TID) | ORAL | 1 refills | Status: DC | PRN

## 2019-02-15 MED FILL — ONDANSETRON HCL 8 MG TABLET: 8 | 6 days supply | Qty: 20 | Fill #0

## 2019-02-15 NOTE — Progress Notes (Signed)
Virtual Visit via Telephone Note  I connected with Gail Moreno on 02/15/19 at  9:30 AM EDT by telephone and verified that I am speaking with the correct person using two identifiers.   I discussed the limitations, risks, security and privacy concerns of performing an evaluation and management service by telephone and the availability of in person appointments. I also discussed with the patient that there may be a patient responsible charge related to this service. The patient expressed understanding and agreed to proceed.  Patient location:  home My Location:  [place] office Persons on the call:  Me and the patient   History of Present Illness: 5-6 nights ago started period and was having dry blood.  Sunday had temp 99.9 after dinner.  Monday night vomited twice.  No further vomiting.  Cough on and off for months.  covid tested a week ago and negative.  No temp above 100.  No runny nose.  No SOB.  Feels ok overall.  No tooth/face pain.  No discolored mucus.     Observations/Objective: A&Ox3  Assessment and Plan: 1. Recurrent cough - benzonatate (TESSALON) 200 MG capsule; Take 1 capsule (200 mg total) by mouth 3 (three) times daily as needed for cough.  Dispense: 30 capsule; Refill: 1  2. Nausea - ondansetron (ZOFRAN) 8 MG tablet; Take 1 tablet (8 mg total) by mouth every 8 (eight) hours as needed for nausea or vomiting.  Dispense: 20 tablet; Refill: 0    Follow Up Instructions: Doubt Covid but could consider retesting prn    I discussed the assessment and treatment plan with the patient. The patient was provided an opportunity to ask questions and all were answered. The patient agreed with the plan and demonstrated an understanding of the instructions.   The patient was advised to call back or seek an in-person evaluation if the symptoms worsen or if the condition fails to improve as anticipated.  I provided 12 minutes of non-face-to-face time during this encounter.   Freeman Caldron, PA-C  Patient ID: Gail Moreno, female   DOB: September 13, 1970, 48 y.o.   MRN: [id number]

## 2019-02-15 NOTE — Telephone Encounter (Signed)
Left message with patient with follow-up appointment information for elevated labs. Patient is scheduled with [facility] on 03/15/19 @ 2:30 pm. If patient needs to reschedule appointment she can call [facility] directly.

## 2019-02-15 NOTE — Progress Notes (Signed)
Patient verified DOB Patient has not taken medication today. Patient has not eaten today. Patient vomited the last time on Monday. Patient checked her fever at 8 am and it was 110.1 an hour later it was 99.7. Patient complains of of cough today and sore throat.

## 2019-02-15 NOTE — Telephone Encounter (Signed)
Health coaching 2   Labs- 135 cholesterol , 76 LDL cholesterol , 54 triglycerides , 48 HDL cholesterol , 5.7 hemoglobin A1C, 90 mean plasma glucose  Patient understands and is aware of her lab results.   Goals-  Discussed lab results with patient. Answered any questions that patient had regarding labs. Informed patient that since her hemoglobin A1C was slightly elevated I would be referring her for follow-up with her doctor at [facility].   Health Coaching: Goals are to eat more fruit and vegetables. Encouraged patient to try and get 2 fruits and 3 vegetables every day. Also encouraged patient to try and reduce the amount of sodas that she drinks as well as watching the amount of sweets and sugar items she consumes.   Navigation:  Patient is aware of 1 more health coaching sessions and a follow up. Patient is scheduled for follow-up at [facility] on September 23rd @ 2:30 pm.  Time- 10 minutes

## 2019-02-15 NOTE — Telephone Encounter (Signed)
Patient called to see if she could get her medications sent to [facility], instead of [facility] in [place].

## 2019-02-16 ENCOUNTER — Ambulatory Visit: Payer: No Typology Code available for payment source | Admitting: Internal Medicine

## 2019-02-16 ENCOUNTER — Telehealth: Payer: Self-pay | Admitting: Family Medicine

## 2019-02-16 NOTE — Telephone Encounter (Signed)
Patient called to see if she can pickup her medications at the pharmacy patient states she is unsure if she should do so or have them mailed to her since she was advised to get another COVID test. Please follow up.

## 2019-02-17 ENCOUNTER — Encounter: Payer: Self-pay | Admitting: Family Medicine

## 2019-02-17 ENCOUNTER — Telehealth: Payer: Self-pay | Admitting: *Deleted

## 2019-02-17 NOTE — Telephone Encounter (Signed)
Per pt she stated she picked up her medication yesterday

## 2019-02-17 NOTE — Telephone Encounter (Signed)
Per pt, her temp went up to 101 last night. Per pt she called the ER last night and was told to follow up this morning with PCP. Informed patient that she may need to go to he ER or Urgent care to get looked at but message will be sent to the provider. Per pt her temp this morning was 100.4.   Per patient she is also needing a note for being seen via tele on 02-15-19. Informed patient that she is needing a note from the 26 th until now. Informed patient that office  Can only write a note for the day she was seen only. Per pt she have not been at work since her appt. Per pt she wants provider to please write not saying she was seen for vomiting and high fever and that's why she was not working.

## 2019-02-17 NOTE — Progress Notes (Signed)
Patient ID: Gail Moreno, female   DOB: 05/05/1971, 48 y.o.   MRN: [id number]   She recent phone messages from patient. Patient had office visit here yesterday which she cancelled. She reports that she continues to have a cough and fever and was advised to seek care at urgent care or ED as no other appointments available other than the one she cancelled. Patient requests note regarding her absence from work from 02/15/2019 through this Monday however I can only provide note that she was seen by another provider on 02/15/2019 as I can not find any other notes or results of COVID testing indicating change/worsening of symptoms or COVID diagnosis. Patient has also been undergoing evaluation for a few months regarding non-productive cough.

## 2019-02-17 NOTE — Telephone Encounter (Signed)
Patient would like to see if provider could put in the chest xray so she can go to the hospital to get it done. Per pt she personally got a Covid test redid on Aug 26 because the test for 20th came back negative.   Per pt she is needing a note for work from the 26th to Monday now because it's at the end of the day.

## 2019-02-17 NOTE — Telephone Encounter (Signed)
I have not seen her for this problem therefore she needs to go to the urgent care or ED if she is still having a fever, cough or not feeling well. I can look at the last office visit note and type a note as to when she was seen.

## 2019-02-17 NOTE — Telephone Encounter (Signed)
I have not seen patient for this issue but there is a note that she Freeman Caldron, [place]. If she is still having a cough and fever, not feeling well then I would suggest that she follow-up at the ED or urgent care for COVID testing and possible CXR to see if she has pneumonia or other issues that need to be treated. Appears that a COVID test was ordered for 02/07/2019 but it does not look like test was done

## 2019-02-20 ENCOUNTER — Emergency Department (HOSPITAL_COMMUNITY): Payer: Self-pay

## 2019-02-20 ENCOUNTER — Emergency Department (HOSPITAL_COMMUNITY)
Admission: EM | Admit: 2019-02-20 | Discharge: 2019-02-20 | Disposition: A | Discharge status: Home / Self Care | Payer: Self-pay | Attending: Emergency Medicine | Admitting: Emergency Medicine

## 2019-02-20 ENCOUNTER — Encounter (HOSPITAL_COMMUNITY): Payer: Self-pay | Admitting: Emergency Medicine

## 2019-02-20 ENCOUNTER — Other Ambulatory Visit: Payer: Self-pay

## 2019-02-20 DIAGNOSIS — R05 Cough: Secondary | ICD-10-CM | POA: Insufficient documentation

## 2019-02-20 DIAGNOSIS — R112 Nausea with vomiting, unspecified: Secondary | ICD-10-CM | POA: Insufficient documentation

## 2019-02-20 DIAGNOSIS — J029 Acute pharyngitis, unspecified: Secondary | ICD-10-CM | POA: Insufficient documentation

## 2019-02-20 DIAGNOSIS — J069 Acute upper respiratory infection, unspecified: Secondary | ICD-10-CM | POA: Insufficient documentation

## 2019-02-20 LAB — URINALYSIS, ROUTINE W REFLEX MICROSCOPIC
Bilirubin Urine: NEGATIVE
Glucose, UA: NEGATIVE mg/dL
Ketones, ur: 5 mg/dL — AB
Leukocytes,Ua: NEGATIVE
Nitrite: NEGATIVE
Protein, ur: 30 mg/dL — AB
Specific Gravity, Urine: 1.028 (ref 1.005–1.030)
pH: 5 (ref 5.0–8.0)

## 2019-02-20 LAB — CBC
HCT: 36.1 % (ref 36.0–46.0)
Hemoglobin: 11.3 g/dL — ABNORMAL LOW (ref 12.0–15.0)
MCH: 27.8 pg (ref 26.0–34.0)
MCHC: 31.3 g/dL (ref 30.0–36.0)
MCV: 88.7 fL (ref 80.0–100.0)
Platelets: 190 10*3/uL (ref 150–400)
RBC: 4.07 MIL/uL (ref 3.87–5.11)
RDW: 13.9 % (ref 11.5–15.5)
WBC: 2.2 10*3/uL — ABNORMAL LOW (ref 4.0–10.5)
nRBC: 0 % (ref 0.0–0.2)

## 2019-02-20 LAB — DIFFERENTIAL
Abs Immature Granulocytes: 0 10*3/uL (ref 0.00–0.07)
Basophils Absolute: 0 10*3/uL (ref 0.0–0.1)
Basophils Relative: 0 %
Eosinophils Absolute: 0 10*3/uL (ref 0.0–0.5)
Eosinophils Relative: 0 %
Lymphocytes Relative: 69 %
Lymphs Abs: 1.5 10*3/uL (ref 0.7–4.0)
Monocytes Absolute: 0.2 10*3/uL (ref 0.1–1.0)
Monocytes Relative: 7 %
Neutro Abs: 0.5 10*3/uL — ABNORMAL LOW (ref 1.7–7.7)
Neutrophils Relative %: 24 %
nRBC: 0 /100 WBC

## 2019-02-20 LAB — COMPREHENSIVE METABOLIC PANEL
ALT: 19 U/L (ref 0–44)
AST: 25 U/L (ref 15–41)
Albumin: 3.8 g/dL (ref 3.5–5.0)
Alkaline Phosphatase: 62 U/L (ref 38–126)
Anion gap: 10 (ref 5–15)
BUN: 12 mg/dL (ref 6–20)
CO2: 22 mmol/L (ref 22–32)
Calcium: 8.6 mg/dL — ABNORMAL LOW (ref 8.9–10.3)
Chloride: 107 mmol/L (ref 98–111)
Creatinine, Ser: 0.97 mg/dL (ref 0.44–1.00)
GFR calc Af Amer: >60 mL/min (ref >60)
GFR calc non Af Amer: >60 mL/min (ref >60)
Glucose, Bld: 96 mg/dL (ref 70–99)
Potassium: 3.9 mmol/L (ref 3.5–5.1)
Sodium: 139 mmol/L (ref 135–145)
Total Bilirubin: 0.3 mg/dL (ref 0.3–1.2)
Total Protein: 7.3 g/dL (ref 6.5–8.1)

## 2019-02-20 LAB — I-STAT BETA HCG BLOOD, ED (MC, WL, AP ONLY): I-stat hCG, quantitative: <5 m[IU]/mL (ref <5)

## 2019-02-20 LAB — LIPASE, BLOOD: Lipase: 22 U/L (ref 11–51)

## 2019-02-20 MED ORDER — ONDANSETRON 4 MG PO TBDP
4.0000 mg | ORAL_TABLET | Freq: Once | ORAL | Status: AC
  Administered 2019-02-20: 4 mg via ORAL
  Filled 2019-02-20: qty 1

## 2019-02-20 MED ORDER — DOXYCYCLINE HYCLATE 100 MG PO CAPS: 100.0000 mg | ORAL_CAPSULE | Freq: Two times a day (BID) | ORAL | 0 refills | Status: AC

## 2019-02-20 MED ORDER — DOXYCYCLINE HYCLATE 100 MG PO CAPS: 100.0000 mg | ORAL_CAPSULE | Freq: Two times a day (BID) | ORAL | 0 refills | Status: DC

## 2019-02-20 MED ORDER — ONDANSETRON HCL 4 MG PO TABS: 4.0000 mg | ORAL_TABLET | Freq: Three times a day (TID) | ORAL | 0 refills | Status: DC | PRN

## 2019-02-20 MED ORDER — SODIUM CHLORIDE 0.9% FLUSH: 3.0000 mL | Freq: Once | INTRAVENOUS | Status: DC

## 2019-02-20 NOTE — ED Triage Notes (Signed)
Pt reports fevers, nausea, and vomiting for the past 8 days. Pt reports doing a televisit and was told to get a COVID test done. Pt reports taking 1 COVID test that was negative and she is waiting on a second test to come back. Pt reports her televisit told her to come to the ED if she had any additional concerns.

## 2019-02-20 NOTE — Discharge Instructions (Addendum)
You will need to self quarantine until you receive the results of your COVID testing. If positive, then you will need to follow the instructions about continuing to self isolate.   You should be isolated for at least 7 days since the onset of your symptoms AND >72 hours after symptoms resolution (absence of fever without the use of fever reducing medication and improvement in respiratory symptoms), whichever is longer  Please rotate tylenol and motrin for your fevers.   Please follow up with your primary care provider within 3-5 days for re-evaluation of your symptoms and to have your labs repeated.   Please return to the emergency department for any new or worsening symptoms.

## 2019-02-20 NOTE — ED Provider Notes (Signed)
[facility] EMERGENCY DEPARTMENT Provider Note   CSN: [id number] Arrival date & time: 02/20/19  1329     History   Chief Complaint Chief Complaint  Patient presents with  . Nausea  . Emesis  . Fever    HPI Gail Moreno is a 48 y.o. female.     HPI   Pt is a 48 y/o female who presents to the ED today for eval of nausea, vomiting and fevers. States that 1 week ago she had nausea and vomiting for 2 days. She has not vomited for 6 days. About a week ago she also developed acute worsening of her chronic cough. Her cough has worsened and since then she has had intermittent blood tinged sputum. This happens about once per day. She has continued to have fevers daily for the last week.  She also reports worsened post nasal drip and a sore throat/itchy throat.  She was able to tolerate soup last night.  She denies abd pain, diarrhea, or constipation. Denies chest pain or shortness of breath.    Denies any known COVID exposures. She states that she has been going to church and some of the parishioners have not been coming due being ill.   She was tested for COVID on 8/20 (asymptomatic at that time) and testing was negative. She was tested again 8/26 and the test is currently pending.     Past Medical History:  Diagnosis Date  . Fibroid tumor     Patient Active Problem List   Diagnosis Date Noted  . Well woman exam with routine gynecological exam 01/26/2019  . Exertional dyspnea 04/11/2018  . Cough 04/11/2018  . Menorrhagia with irregular cycle 03/20/2016  . Fibroid uterus 03/20/2016  . Iron deficiency anemia 03/20/2016    Past Surgical History:  Procedure Laterality Date  . BREAST EXCISIONAL BIOPSY Left    2003 benign  . FOOT SURGERY    . MYRINGOTOMY WITH TUBE PLACEMENT    . SKIN GRAFT    . WISDOM TOOTH EXTRACTION       OB History    Gravida  1   Para  1   Term  1   Preterm      AB      Living  1     SAB      TAB      Ectopic      Multiple      Live Births               Home Medications    Prior to Admission medications   Medication Sig Start Date End Date Taking? Authorizing Provider  benzonatate (TESSALON) 200 MG capsule Take 1 capsule (200 mg total) by mouth 3 (three) times daily as needed for cough. 02/15/19   Argentina Donovan, PA-C  cyclobenzaprine (FLEXERIL) 10 MG tablet Take 1 tablet (10 mg total) by mouth at bedtime. As needed for muscle spasms Patient not taking: Reported on 09/20/2018 03/14/18   Fulp, Ander Gaster, MD  doxycycline (VIBRAMYCIN) 100 MG capsule Take 1 capsule (100 mg total) by mouth 2 (two) times daily for 7 days. 02/20/19 02/27/19  Kristain Hu S, PA-C  famotidine (PEPCID) 20 MG tablet Take 1 tablet (20 mg total) by mouth 2 (two) times daily. To reduce stomach acid Patient not taking: Reported on 01/26/2019 04/11/18   Fulp, Ander Gaster, MD  loratadine (CLARITIN) 10 MG tablet Take 1 tablet (10 mg total) by mouth daily. Patient not taking: Reported on 01/26/2019 05/17/18  Juanito Doom, MD  montelukast (SINGULAIR) 10 MG tablet Take 1 tablet (10 mg total) by mouth at bedtime. Patient not taking: Reported on 09/20/2018 06/28/18   Juanito Doom, MD  ondansetron (ZOFRAN) 8 MG tablet Take 1 tablet (8 mg total) by mouth every 8 (eight) hours as needed for nausea or vomiting. 02/15/19   Argentina Donovan, PA-C    Family History Family History  Problem Relation Age of Onset  . Hypertension Mother   . Hypertension Father   . Cancer Father        lungs  . Hypertension Sister   . Cancer Sister        pancreatitic  . Diabetes Maternal Aunt   . Diabetes Maternal Uncle     Social History Social History   Tobacco Use  . Smoking status: Never Smoker  . Smokeless tobacco: Never Used  Substance Use Topics  . Alcohol use: Yes    Comment: occasionally  . Drug use: No     Allergies   Dilaudid [hydromorphone hcl]   Review of Systems Review of Systems  Constitutional: Positive for fever.  Negative for chills.  HENT: Positive for postnasal drip and sore throat. Negative for ear pain.   Eyes: Negative for visual disturbance.  Respiratory: Positive for cough. Negative for shortness of breath.   Cardiovascular: Negative for chest pain.  Gastrointestinal: Positive for nausea and vomiting. Negative for abdominal pain, constipation and diarrhea.  Genitourinary: Negative for dysuria and hematuria.  Musculoskeletal: Negative for back pain.  Skin: Negative for rash.  Neurological: Negative for headaches.  All other systems reviewed and are negative.   Physical Exam Updated Vital Signs BP 107/84   Pulse 95   Temp 99.3 F (37.4 C) (Oral)   Resp 16   Ht 5\' 3"  (1.6 m)   Wt 70.8 kg   LMP 02/16/2019 (Exact Date)   SpO2 98%   BMI 27.63 kg/m   Physical Exam Vitals signs and nursing note reviewed.  Constitutional:      General: She is not in acute distress.    Appearance: She is well-developed. She is not ill-appearing or toxic-appearing.  HENT:     Head: Normocephalic and atraumatic.  Eyes:     Conjunctiva/sclera: Conjunctivae normal.  Neck:     Musculoskeletal: Neck supple.  Cardiovascular:     Rate and Rhythm: Normal rate and regular rhythm.     Heart sounds: No murmur.  Pulmonary:     Effort: Pulmonary effort is normal. No tachypnea or respiratory distress.     Breath sounds: Examination of the right-lower field reveals rales. Examination of the left-lower field reveals rales. Rales present. No decreased breath sounds, wheezing or rhonchi.  Abdominal:     Palpations: Abdomen is soft.     Tenderness: There is no abdominal tenderness.  Skin:    General: Skin is warm and dry.  Neurological:     Mental Status: She is alert.     ED Treatments / Results  Labs (all labs ordered are listed, but only abnormal results are displayed) Labs Reviewed  COMPREHENSIVE METABOLIC PANEL - Abnormal; Notable for the following components:      Result Value   Calcium 8.6 (*)     All other components within normal limits  CBC - Abnormal; Notable for the following components:   WBC 2.2 (*)    Hemoglobin 11.3 (*)    All other components within normal limits  URINALYSIS, ROUTINE W REFLEX MICROSCOPIC - Abnormal; Notable for  the following components:   APPearance HAZY (*)    Hgb urine dipstick SMALL (*)    Ketones, ur 5 (*)    Protein, ur 30 (*)    Bacteria, UA RARE (*)    All other components within normal limits  LIPASE, BLOOD  I-STAT BETA HCG BLOOD, ED ([facility], [facility], [facility] ONLY)    EKG None  Radiology Dg Chest Portable 1 View  Result Date: 02/20/2019 CLINICAL DATA:  Pt reports fevers, nausea, and vomiting for the past 8 days. Pt states cough x 2017 on and off. Pt reports doing a tele visit and was told to get a COVID test done. Pt reports taking 1 COVID test that was negative and she is waiting for a second test to come back. EXAM: PORTABLE CHEST 1 VIEW COMPARISON:  Multiple priors, most recent 03/14/2018 FINDINGS: Heart size is normal. There are patchy infiltrates at the lung bases bilaterally, consistent with inflammatory or infectious process. No pulmonary edema. No pleural effusions. IMPRESSION: Bilateral lower lobe infiltrates. Electronically Signed   By: Nolon Nations M.D.   On: 02/20/2019 17:56    Procedures Procedures (including critical care time)  Medications Ordered in ED Medications  sodium chloride flush (NS) 0.9 % injection 3 mL (has no administration in time range)  ondansetron (ZOFRAN-ODT) disintegrating tablet 4 mg (4 mg Oral Given 02/20/19 1714)     Initial Impression / Assessment and Plan / ED Course  I have reviewed the triage vital signs and the nursing notes.  Pertinent labs & imaging results that were available during my care of the patient were reviewed by me and considered in my medical decision making (see chart for details).   Final Clinical Impressions(s) / ED Diagnoses   Final diagnoses:  Upper respiratory tract infection,  unspecified type   48 year old female presenting for fevers and cough for 1 week.  Initially had nausea and vomiting with the symptoms but that is resolved.  Her doctor recommended COVID testing which she has done but results are currently pending.  On exam, patient well-appearing.  Satting well on room air.  She does have a borderline temp. She has no abdominal tenderness.   She does have rales to the bilateral lower lobes.  She is not tachypneic.  She is speaking in full sentences.  The remainder of her exam is benign.  Laboratory work obtained in triage was reviewed. Patient somewhat leukopenic compared to prior.  I suspect this may be related to possible COVID. CMP without gross electrolyte derangement.  Normal kidney function and LFTs. Lipase is negative Beta-hCG negative UA with small hematuria, ketonuria and proteinuria.  No evidence of urinary tract infection.  Chest x-ray shows bilateral lower lobe infiltrates.  X-ray was personally reviewed by me.  I suspect that this may be related to COVID however she does not have her test results yet.  We will treat for potential bacterial pneumonia.  Advised on supportive management.  She has Best boy at home.  Advised on self quarantining.  Advised to follow-up with her PCP in the next 3 to 5 days for reevaluation and repeat labs given her leukopenia.  Advised to return to the ED for new or worsening symptoms.  She voices understanding and is in agreement plan.  All questions answered.  Patient stable for discharge.  -------  [name] was evaluated in Emergency Department on 02/20/2019 for the symptoms described in the history of present illness. She was evaluated in the context of the global COVID-19 pandemic,  which necessitated consideration that the patient might be at risk for infection with the SARS-CoV-2 virus that causes COVID-19. Institutional protocols and algorithms that pertain to the evaluation of patients at risk for COVID-19  are in a state of rapid change based on information released by regulatory bodies including the CDC and federal and state organizations. These policies and algorithms were followed during the patient's care in the ED.   ED Discharge Orders         Ordered    doxycycline (VIBRAMYCIN) 100 MG capsule  2 times daily     02/20/19 [address], PA-C 02/20/19 Garvin, [place], DO 02/20/19 1914

## 2019-02-20 NOTE — Telephone Encounter (Signed)
Spoke with patient and informed her with what provider stated. Patient agreed to go to the ED today to get a further evaluation. Per pt she's still having a fever and was able to eat a little today but still not feeling well. Per pt she will go to the ED today.

## 2019-02-21 LAB — PATHOLOGIST SMEAR REVIEW

## 2019-02-21 MED FILL — ONDANSETRON HCL 4 MG TABLET: 4 | 2 days supply | Qty: 6 | Fill #0

## 2019-02-21 MED FILL — ?DOXYCYCLINE HYCL 100MG: 100 | 7 days supply | Qty: 14 | Fill #0

## 2019-02-22 ENCOUNTER — Telehealth: Payer: Self-pay | Admitting: Family Medicine

## 2019-02-22 NOTE — Telephone Encounter (Signed)
Per pt she is saying that she was tested positive for COVID 19. Per pt when she went to the hospital, they treated her for pneumonia and gived her Doxycycline. Per pt should she take this medication.

## 2019-02-22 NOTE — Telephone Encounter (Signed)
New Message   Pt states she is taking an antibiotic doxycycline (VIBRAMYCIN) 100 MG capsule and she wants to know if she should continue or stop taking it due to her having Covid. Please f/u

## 2019-02-22 NOTE — Telephone Encounter (Signed)
Patient called to give an update on her COVID test results and states she did test positive and was told her quarantine period would end tomorrow at midnight. Please follow up

## 2019-02-23 NOTE — Telephone Encounter (Signed)
She was recently prescribed the medication for treatment of pneumonia and needs to take the medication

## 2019-02-23 NOTE — Telephone Encounter (Signed)
Yes, her xray reports shows that she has pneumonia and since she still has symptoms, she is probably still supposed to quarantine. She needs to look at her ED discharge notes and call them with any questions and she may want to schedule a telehealth visit here or at urgent care if she is not feeling well

## 2019-02-24 NOTE — Telephone Encounter (Signed)
Informed patient with what provider stated and she verbalized understanding.  

## 2019-02-24 NOTE — Telephone Encounter (Signed)
Informed pt with what provider stated and she verbalized understanding.

## 2019-02-28 ENCOUNTER — Encounter: Payer: No Typology Code available for payment source | Admitting: Internal Medicine

## 2019-03-01 NOTE — Progress Notes (Signed)
Synopsis: Referred in September 2019 for chronic cough by Antony Blackbird, MD. Previously a patient of Dr. Lake Bells.  Subjective:   PATIENT ID: Gail Moreno GENDER: female DOB: 1971-04-03, MRN: [id number]  Chief Complaint  Patient presents with  . Consult    Former BQ pt for a dry cough. Cough is so strong at times that she gags.     Gail Moreno is a 48 year old woman with a history of chronic cough due to upper airway cough syndrome.  She has a history of allergic rhinosinusitis for which she has been prescribed antihistamines, montelukast, and intranasal steroids.  She has also been prescribed multiple cough medications to relieve her symptoms.  She has GERD, for which she is prescribed an H2 blocker.  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  As of mid August, she reported that she was not taking her Pepcid, antihistamine, or montelukast and had to restart Tessalon to relieve her cough.  She had stopped some of these due to no perceived benefit, some because her pharmacy she preferred to use was not open due to [place].  She is concerned about the black box warning associated with montelukast.  She underwent COVID testing twice at the [facility] on [address], and was found to be positive from a test performed on 02/15/2019.  Previously she had tested negative on 02/09/2019, but developed prominent GI symptoms and fevers starting on 8/23. She had a recent ED visit on 02/20/2019 for nausea, vomiting, fevers, and worsened cough from her baseline, and was prescribed doxycycline which she completed.    Today her symptoms are back to baseline.  She complains of ongoing cough, sometimes triggering gagging.  This is been an ongoing issue that waxes and wanes since at least 2017.  She has postnasal drip, but denies heartburn.  She does feel a sensation of reflux  coming through her chest, but says that it does not burn.  She has not tried any medications to address the postnasal drip or reflux.  She takes Tessalon frequently, but does not feel that it controls her cough.  She does not have nocturnal coughing or sleep interruption.  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   Does not drink alcohol, but snacks in the evenings frequently. She reports that she has previously not been educated on a GERD diet.            Past Medical History:  Diagnosis Date  . Fibroid tumor      Family History  Problem Relation Age of Onset  . Hypertension Mother   . Hypertension Father   . Cancer Father        lungs  . Hypertension Sister   . Cancer Sister        pancreatitic  . Diabetes Maternal Aunt   . Diabetes Maternal Uncle      Past Surgical History:  Procedure Laterality Date  . BREAST EXCISIONAL BIOPSY Left    2003 benign  . FOOT SURGERY    . MYRINGOTOMY WITH TUBE PLACEMENT    . SKIN GRAFT    . WISDOM TOOTH EXTRACTION      Social History   Socioeconomic History  . Marital status: Divorced    Spouse name: Not on file  . Number of children: 1  . Years of  education: Not on file  . Highest education level: Associate degree: occupational, Hotel manager, or vocational program  Occupational History  . Not on file  Social Needs  . Financial resource strain: Not on file  . Food insecurity    Worry: Not on file    Inability: Not on file  . Transportation needs    Medical: No    Non-medical: No  Tobacco Use  . Smoking status: Never Smoker  . Smokeless tobacco: Never Used  Substance and Sexual Activity  . Alcohol use: Yes    Comment: occasionally  . Drug use: No  . Sexual activity: Yes    Birth control/protection: None  Lifestyle  . Physical activity    Days per week: Not on file    Minutes per session: Not on file  . Stress: Not on file  Relationships  . Social Herbalist on phone: Not  on file    Gets together: Not on file    Attends religious service: Not on file    Active member of club or organization: Not on file    Attends meetings of clubs or organizations: Not on file    Relationship status: Not on file  . Intimate partner violence    Fear of current or ex partner: Not on file    Emotionally abused: Not on file    Physically abused: Not on file    Forced sexual activity: Not on file  Other Topics Concern  . Not on file  Social History Narrative  . Not on file     Allergies  Allergen Reactions  . Dilaudid [Hydromorphone Hcl] Nausea And Vomiting  . Singulair [Montelukast Sodium]      Immunization History  Administered Date(s) Administered  . Influenza,inj,Quad PF,6+ Mos 03/14/2018    Outpatient Medications Prior to Visit  Medication Sig Dispense Refill  . benzonatate (TESSALON) 200 MG capsule Take 1 capsule (200 mg total) by mouth 3 (three) times daily as needed for cough. 30 capsule 1  . ondansetron (ZOFRAN) 4 MG tablet Take 1 tablet (4 mg total) by mouth every 8 (eight) hours as needed for nausea or vomiting. 6 tablet 0  . cyclobenzaprine (FLEXERIL) 10 MG tablet Take 1 tablet (10 mg total) by mouth at bedtime. As needed for muscle spasms (Patient not taking: Reported on 09/20/2018) 30 tablet 1  . famotidine (PEPCID) 20 MG tablet Take 1 tablet (20 mg total) by mouth 2 (two) times daily. To reduce stomach acid (Patient not taking: Reported on 01/26/2019) 60 tablet 6  . loratadine (CLARITIN) 10 MG tablet Take 1 tablet (10 mg total) by mouth daily. (Patient not taking: Reported on 01/26/2019) 30 tablet 3  . montelukast (SINGULAIR) 10 MG tablet Take 1 tablet (10 mg total) by mouth at bedtime. (Patient not taking: Reported on 09/20/2018) 30 tablet 2   No facility-administered medications prior to visit.     Review of Systems  Constitutional: Negative for chills and malaise/fatigue.       Fevers resolved  HENT: Negative for congestion.        Chronic throat  irritation  Eyes: Negative.   Respiratory: Positive for cough.   Cardiovascular: Negative for chest pain and leg swelling.  Gastrointestinal: Positive for heartburn. Negative for nausea and vomiting.  Genitourinary: Negative.   Musculoskeletal: Negative for joint pain and myalgias.  Skin: Negative for rash.  Neurological: Negative for focal weakness and headaches.  Endo/Heme/Allergies: Does not bruise/bleed easily.  Psychiatric/Behavioral: Negative.  Objective:   Vitals:   03/02/19 0921  BP: 110/62  Pulse: 89  Temp: (!) 96.4 F (35.8 C)  TempSrc: Temporal  SpO2: 96%  Weight: 157 lb 6.4 oz (71.4 kg)  Height: 5\' 3"  (1.6 m)   96% on RA BMI Readings from Last 3 Encounters:  03/02/19 27.88 kg/m  02/20/19 27.63 kg/m  02/08/19 27.17 kg/m   Wt Readings from Last 3 Encounters:  03/02/19 157 lb 6.4 oz (71.4 kg)  02/20/19 156 lb (70.8 kg)  02/08/19 162 lb (73.5 kg)    Physical Exam Vitals signs reviewed.  Constitutional:      Appearance: Normal appearance. She is not ill-appearing.  HENT:     Head: Normocephalic and atraumatic.     Nose:     Comments: Deferred due to masking requirement.    Mouth/Throat:     Comments: Deferred due to masking requirement. Eyes:     General: No scleral icterus. Neck:     Musculoskeletal: Neck supple.  Cardiovascular:     Rate and Rhythm: Normal rate and regular rhythm.     Heart sounds: No murmur.  Pulmonary:     Comments: Dry cough.  Frequent throat clearing.  Clear to auscultation bilaterally.  Breathing comfortably on room air.  Speaking in full sentences without conversational dyspnea. Abdominal:     General: Abdomen is flat. There is no distension.     Palpations: Abdomen is soft.     Tenderness: There is no abdominal tenderness.  Musculoskeletal:        General: No swelling or deformity.  Lymphadenopathy:     Cervical: No cervical adenopathy.  Skin:    General: Skin is warm and dry.     Findings: No rash.      Comments: Well-healed scars on her right arm from previous skin grafts.  Neurological:     General: No focal deficit present.     Mental Status: She is alert.     Coordination: Coordination normal.  Psychiatric:        Mood and Affect: Mood normal.        Behavior: Behavior normal.      CBC    Component Value Date/Time   WBC 2.2 (L) 02/20/2019 1359   RBC 4.07 02/20/2019 1359   HGB 11.3 (L) 02/20/2019 1359   HGB 10.9 (L) 06/13/2018 1018   HCT 36.1 02/20/2019 1359   HCT 33.6 (L) 06/13/2018 1018   PLT 190 02/20/2019 1359   PLT 259 06/13/2018 1018   MCV 88.7 02/20/2019 1359   MCV 86 06/13/2018 1018   MCH 27.8 02/20/2019 1359   MCHC 31.3 02/20/2019 1359   RDW 13.9 02/20/2019 1359   RDW 13.7 06/13/2018 1018   LYMPHSABS 1.5 02/20/2019 1359   LYMPHSABS 1.2 06/13/2018 1018   MONOABS 0.2 02/20/2019 1359   EOSABS 0.0 02/20/2019 1359   EOSABS 0.1 06/13/2018 1018   BASOSABS 0.0 02/20/2019 1359   BASOSABS 0.0 06/13/2018 1018    Chest Imaging- films reviewed: CXR 02/20/2019- one-view portable chest x-ray, no significant opacities, effusions, or masses. CXR 03/15/1999 19-2 view chest x-ray demonstrating increased vascularity in the bases, but no opacities or masses.  No effusions. CT abdomen/pelvis 05/30/2016- lower lung cuts reviewed-no effusions, pleural thickening, opacities, or other abnormalities.  Pulmonary Functions Testing Results: PFT Results Latest Ref Rng & Units 04/11/2018  FVC-Pre L 2.78  FVC-Predicted Pre % 95  FVC-Post L 2.88  FVC-Predicted Post % 98  Pre FEV1/FVC % % 81  Post FEV1/FCV % %  85  FEV1-Pre L 2.25  FEV1-Predicted Pre % 94  FEV1-Post L 2.45  DLCO UNC% % 71  DLCO COR %Predicted % 84  TLC L 4.36  TLC % Predicted % 87  RV % Predicted % 76  No obstruction or restriction. Mildly reduced DLCO, not corrected for hemoglobin.     Assessment & Plan:     ICD-10-CM   1. Chronic cough  R05 famotidine (PEPCID) 20 MG tablet    fluticasone (FLONASE) 50 MCG/ACT  nasal spray    fexofenadine (ALLEGRA) 180 MG tablet  2. Allergic rhinitis due to pollen, unspecified seasonality  J30.1 fluticasone (FLONASE) 50 MCG/ACT nasal spray    fexofenadine (ALLEGRA) 180 MG tablet  3. Gastroesophageal reflux disease, esophagitis presence not specified  K21.9 famotidine (PEPCID) 20 MG tablet  4. COVID-19 virus infection  U07.1    Recent COVID-19 viral infection - Although she has completed her mandatory quarantine, I encouraged her to be cautious given her chronic cough and the risk of prolonged viral shedding. - Continue mask wearing in social distancing precautions.  Chronic cough due to upper airway cough syndrome - I spent an extensive amount of time educating her that cough is her symptom, and it will persist unless the underlying cause is treated - Encouraged medication compliance for the next 3 months until she sees me in the office - Reiterated Dr. Anastasia Pall previous recommendation for periodic vocal rest to improve; this should be a bit easier currently with people spending more time at home. -Okay to use tessalon PRN, but discussed that this therapy alone will not resolved her issue; it is only going to make the symptom more tolerable.  Allergic rhinosinusitis causing post-nasal drip -encouraged medication compliance and discussed her regimen multiple times -Allegra nightly; she previously had fatigue from zyrtec and claritin -intranasal fluticasone 2 puffs daily  GERD- has reflux sensation without "heartburn" -Discussed GERD lifestyle modifications, including elevating the head of the bead and avoiding eating within 3-4 hours of going to sleep -Dietary education- counseled to avoid caffeine, carbonation, ETOH, mint, fatty foods, chocolate, and any foods that she feels trigger her symptoms. -pepcid BID; can escalate to PPI if her symptoms remain uncontrolled   She will remain on her medication regimen for 3 months uninterrupted until she follows up. RTC 3  months.  Current Outpatient Medications:  .  benzonatate (TESSALON) 200 MG capsule, Take 1 capsule (200 mg total) by mouth 3 (three) times daily as needed for cough., Disp: 30 capsule, Rfl: 1 .  famotidine (PEPCID) 20 MG tablet, Take 1 tablet (20 mg total) by mouth 2 (two) times daily., Disp: 30 tablet, Rfl: 11 .  fexofenadine (ALLEGRA) 180 MG tablet, Take 1 tablet (180 mg total) by mouth daily., Disp: 30 tablet, Rfl: 11 .  fluticasone (FLONASE) 50 MCG/ACT nasal spray, Place 2 sprays into both nostrils daily., Disp: 16 g, Rfl: 2   Julian Hy, DO [facility] 03/02/2019 12:31 PM

## 2019-03-02 ENCOUNTER — Ambulatory Visit (INDEPENDENT_AMBULATORY_CARE_PROVIDER_SITE_OTHER): Payer: HRSA Program | Admitting: Critical Care Medicine

## 2019-03-02 ENCOUNTER — Other Ambulatory Visit: Payer: Self-pay

## 2019-03-02 ENCOUNTER — Telehealth: Payer: Self-pay | Admitting: Critical Care Medicine

## 2019-03-02 ENCOUNTER — Encounter: Payer: Self-pay | Admitting: Critical Care Medicine

## 2019-03-02 VITALS — BP 110/62 | HR 89 | Temp 96.4°F | Ht 63.0 in | Wt 157.4 lb

## 2019-03-02 DIAGNOSIS — R05 Cough: Secondary | ICD-10-CM | POA: Diagnosis not present

## 2019-03-02 DIAGNOSIS — K219 Gastro-esophageal reflux disease without esophagitis: Secondary | ICD-10-CM | POA: Diagnosis not present

## 2019-03-02 DIAGNOSIS — R053 Chronic cough: Secondary | ICD-10-CM

## 2019-03-02 DIAGNOSIS — J301 Allergic rhinitis due to pollen: Secondary | ICD-10-CM

## 2019-03-02 DIAGNOSIS — U071 COVID-19: Secondary | ICD-10-CM

## 2019-03-02 MED ORDER — FLUTICASONE PROPIONATE 50 MCG/ACT NA SUSP: 2.0000 | Freq: Every day | NASAL | 2 refills | Status: DC

## 2019-03-02 MED ORDER — FAMOTIDINE 20 MG PO TABS: 20.0000 mg | ORAL_TABLET | Freq: Two times a day (BID) | ORAL | 11 refills | Status: DC

## 2019-03-02 MED ORDER — FEXOFENADINE HCL 180 MG PO TABS: 180.0000 mg | ORAL_TABLET | Freq: Every day | ORAL | 11 refills | Status: DC

## 2019-03-02 NOTE — Patient Instructions (Addendum)
Thank you for visiting Dr. Carlis Abbott at [facility]. We recommend the following:   Meds ordered this encounter  Medications  . famotidine (PEPCID) 20 MG tablet    Sig: Take 1 tablet (20 mg total) by mouth 2 (two) times daily.    Dispense:  30 tablet    Refill:  11  . fluticasone (FLONASE) 50 MCG/ACT nasal spray    Sig: Place 2 sprays into both nostrils daily.    Dispense:  16 g    Refill:  2  . fexofenadine (ALLEGRA) 180 MG tablet    Sig: Take 1 tablet (180 mg total) by mouth daily.    Dispense:  30 tablet    Refill:  11    Return in about 3 months (around 06/01/2019).    Please do your part to reduce the spread of COVID-19.

## 2019-03-02 NOTE — Telephone Encounter (Signed)
Patient called back to clinic. She stated that she went over to the [facility] to pick up her prescriptions. She was told that they do not carry Allegra and that Pepcid is on backorder at all pharmacies. They do not except to get this prescription ready until mid October at the latest. Advised her that I would talk to Dr. Carlis Abbott about this. She verbalized understanding.   Spoke with Dr. Carlis Abbott. She stated that in regards to the Pepcid, she would be willing to call in Protonix 40mg  once daily. She wanted to know if the patient wanted [place] to send the Allegra to another pharmacy or if the patient wants to get this OTC.   Left message for patient to call back.

## 2019-03-09 ENCOUNTER — Encounter: Payer: Self-pay | Admitting: Family Medicine

## 2019-03-09 ENCOUNTER — Ambulatory Visit: Payer: Self-pay | Attending: Family Medicine | Admitting: Family Medicine

## 2019-03-09 ENCOUNTER — Other Ambulatory Visit: Payer: Self-pay

## 2019-03-09 DIAGNOSIS — R053 Chronic cough: Secondary | ICD-10-CM

## 2019-03-09 DIAGNOSIS — J309 Allergic rhinitis, unspecified: Secondary | ICD-10-CM

## 2019-03-09 DIAGNOSIS — K219 Gastro-esophageal reflux disease without esophagitis: Secondary | ICD-10-CM

## 2019-03-09 DIAGNOSIS — R05 Cough: Secondary | ICD-10-CM

## 2019-03-09 NOTE — Progress Notes (Signed)
Virtual Visit via Telephone Note  I connected with [name] on 03/09/19 at  9:30 AM EDT by telephone and verified that I am speaking with the correct person using two identifiers.   I discussed the limitations, risks, security and privacy concerns of performing an evaluation and management service by telephone and the availability of in person appointments. I also discussed with the patient that there may be a patient responsible charge related to this service. The patient expressed understanding and agreed to proceed.  Patient Location: [place] Location: [facility] office Others participating in call: none   History of Present Illness:       48 year old female seen in follow-up of emergency department visit on 02/20/2019 due to nausea and vomiting along with fever.  Patient had also developed acute worsening of her chronic cough along with postnasal drainage, occasional streaks of blood in the sputum and sore throat.  She reported negative COVID test on 02/17/2019 and again on 02/15/2019 but results were pending at the time of her ED visit. As patient had a pending COVID test, this was not repeated in the emergency department however patient did have chest x-ray showing bilateral lower lobe infiltrates and she was treated presumptively for pneumonia related to COVID-19 and prescribed doxycycline for viral pneumonia.       Patient states that she continues to have a cough but it is now more like a chronic cough that she has had for years.  She denies any shortness of breath or wheezing.  No headache or dizziness, no loss of sense of smell or taste and nausea and vomiting have resolved.  She is no longer having any fevers or headaches.  She reports that the last time she saw her pulmonologist, 1 of the medicines that she was told to take was on back order at the pharmacy and she believes that this was medicine to help with acid reflux.  Patient also did not obtain the allergy pill that she was  prescribed but she is using the Flonase.  She continues to have sensation of postnasal drainage and nasal congestion and continues to have a dry, nonproductive cough and occasional sensation of foreign body/mucus in her throat and this normally occurs in the mornings.   Past Medical History:  Diagnosis Date  . Fibroid tumor     Past Surgical History:  Procedure Laterality Date  . BREAST EXCISIONAL BIOPSY Left    2003 benign  . FOOT SURGERY    . MYRINGOTOMY WITH TUBE PLACEMENT    . SKIN GRAFT    . WISDOM TOOTH EXTRACTION      Family History  Problem Relation Age of Onset  . Hypertension Mother   . Hypertension Father   . Cancer Father        lungs  . Hypertension Sister   . Cancer Sister        pancreatitic  . Diabetes Maternal Aunt   . Diabetes Maternal Uncle     Social History   Tobacco Use  . Smoking status: Never Smoker  . Smokeless tobacco: Never Used  Substance Use Topics  . Alcohol use: Yes    Comment: occasionally  . Drug use: No     Allergies  Allergen Reactions  . Dilaudid [Hydromorphone Hcl] Nausea And Vomiting  . Singulair [Montelukast Sodium]        Observations/Objective: No vital signs or physical exam conducted as visit was done via telephone  Assessment and Plan: 1. Chronic cough Patient reports  that her symptoms of COVID-19 pneumonia/infection have resolved.  She now continues to have her usual baseline chronic cough.  Patient's most recent pulmonology visit from 03/02/2019 was reviewed and I discussed with the patient that pulmonology believes that patient's cough is due to a combination of chronic allergic rhinitis and chronic reflux symptoms and reviewed medications that patient was supposed to take.  Patient apparently is not taking Allegra for allergic rhinitis and is not taking Pepcid for reflux symptoms.  2. Chronic allergic rhinitis Discussed with the patient that if Allegra was not available at her pharmacy that it could also be purchased  over-the-counter or that she could try a different allergy medicine such as Zyrtec.  Discussed with patient that I will also asked the clinical pharmacist to look into whether or not her medications are now available.  Per clinical pharmacist, patient should be able to pick up generic Allegra/fexofenadine here at this pharmacy and he will contact patient to make sure that she is aware.  3. Gastroesophageal reflux disease, esophagitis presence not specified Patient with chronic cough which is thought by pulmonology to be secondary to chronic allergic rhinitis and acid reflux symptoms.  Patient is not currently on medicine to help with acid reduction.  Discussed patient's medications with clinical pharmacist and Pepcid is currently on back order.  This medication may be available to purchase over-the-counter.  Patient also reports that she is calling her pulmonologist later today to see if there are any other suggestions for reflux medications.  Clinical pharmacist will notify patient that Pepcid is still backorder but that it may be possible that she can obtain this medication over-the-counter or can contact the office if pulmonologist makes a change in the medication.  Follow Up Instructions:Return if symptoms worsen or fail to improve.    I discussed the assessment and treatment plan with the patient. The patient was provided an opportunity to ask questions and all were answered. The patient agreed with the plan and demonstrated an understanding of the instructions.   The patient was advised to call back or seek an in-person evaluation if the symptoms worsen or if the condition fails to improve as anticipated.  I provided 16 minutes of non-face-to-face time during this encounter.   Antony Blackbird, MD

## 2019-03-15 ENCOUNTER — Ambulatory Visit: Payer: No Typology Code available for payment source | Admitting: Family Medicine

## 2019-03-27 ENCOUNTER — Telehealth: Payer: Self-pay | Admitting: Critical Care Medicine

## 2019-03-27 MED ORDER — PANTOPRAZOLE SODIUM 40 MG PO TBEC: 40.0000 mg | DELAYED_RELEASE_TABLET | Freq: Every day | ORAL | 5 refills | Status: DC

## 2019-03-27 NOTE — Telephone Encounter (Signed)
Spoke with patient. Advised her that we did try to call her back but never heard back from her. This was the message:   "Patient called back to clinic. She stated that she went over to the [facility] to pick up her prescriptions. She was told that they do not carry Allegra and that Pepcid is on backorder at all pharmacies. They do not except to get this prescription ready until mid October at the latest. Advised her that I would talk to Dr. Carlis Abbott about this. She verbalized understanding.   Spoke with Dr. Carlis Abbott. She stated that in regards to the Pepcid, she would be willing to call in Protonix 40mg  once daily. She wanted to know if the patient wanted [place] to send the Allegra to another pharmacy or if the patient wants to get this OTC.   Left message for patient to call back."   Spoke with patient, she is aware of the Protonix. She tried to get the Allegra over the counter but it was too expensive. She wants to know if there is anything else she could have. Advised her I would send a message to Dr. Carlis Abbott, she verbalized understanding.   Dr. Carlis Abbott, please advise, thanks!

## 2019-03-28 MED FILL — PANTOPRAZOLE SOD DR 40 MG T: 40 | 30 days supply | Qty: 30 | Fill #0

## 2019-03-28 NOTE — Telephone Encounter (Signed)
Left message for patient to call back  

## 2019-03-30 NOTE — Telephone Encounter (Signed)
Per Dr. Carlis Abbott >> The generic of zyrtec (cetirizine), claritin (loratidine), or allegra are all fine. She can try multiple pharmacies because it may be less expensive elsewhere. Thanks!  LMTCB x1 for pt.

## 2019-03-31 NOTE — Telephone Encounter (Signed)
Called pt and advised message from the provider. Pt understood and verbalized understanding. Nothing further is needed.    

## 2019-03-31 NOTE — Telephone Encounter (Signed)
ATC pt, no answer. Left message for pt to call back.  

## 2019-04-20 ENCOUNTER — Other Ambulatory Visit: Payer: Self-pay

## 2019-04-20 ENCOUNTER — Ambulatory Visit: Payer: Self-pay | Attending: Family Medicine

## 2019-04-20 ENCOUNTER — Ambulatory Visit: Payer: No Typology Code available for payment source | Admitting: Family Medicine

## 2019-04-27 MED FILL — PANTOPRAZOLE SOD DR 40 MG T: 40 | 30 days supply | Qty: 30 | Fill #1

## 2019-05-31 MED FILL — PANTOPRAZOLE SOD DR 40 MG T: 40 | 30 days supply | Qty: 30 | Fill #2

## 2019-07-21 MED FILL — PANTOPRAZOLE SOD DR 40 MG T: 40 | 30 days supply | Qty: 30 | Fill #3

## 2019-07-31 ENCOUNTER — Encounter: Payer: Self-pay | Admitting: Family Medicine

## 2019-07-31 ENCOUNTER — Other Ambulatory Visit: Payer: Self-pay

## 2019-07-31 ENCOUNTER — Ambulatory Visit: Payer: Self-pay | Attending: Family Medicine | Admitting: Family Medicine

## 2019-07-31 DIAGNOSIS — D509 Iron deficiency anemia, unspecified: Secondary | ICD-10-CM

## 2019-07-31 DIAGNOSIS — J309 Allergic rhinitis, unspecified: Secondary | ICD-10-CM

## 2019-07-31 DIAGNOSIS — D72819 Decreased white blood cell count, unspecified: Secondary | ICD-10-CM

## 2019-07-31 DIAGNOSIS — R7303 Prediabetes: Secondary | ICD-10-CM

## 2019-07-31 DIAGNOSIS — R05 Cough: Secondary | ICD-10-CM

## 2019-07-31 DIAGNOSIS — R053 Chronic cough: Secondary | ICD-10-CM

## 2019-07-31 NOTE — Progress Notes (Signed)
Patient verified DOB Patient has not taken medication. Patient has eaten today. Patient denies pain at this time.

## 2019-07-31 NOTE — Progress Notes (Signed)
Virtual Visit via Telephone Note  I connected with Gail Moreno on 07/31/19 at  4:10 PM EST by telephone and verified that I am speaking with the correct person using two identifiers.   I discussed the limitations, risks, security and privacy concerns of performing an evaluation and management service by telephone and the availability of in person appointments. I also discussed with the patient that there may be a patient responsible charge related to this service. The patient expressed understanding and agreed to proceed.  Patient Location: Home Provider Location: [facility] Others participating in call: None   History of Present Illness:        49 yo female who is following up on chronic medical issues.  She reports that she continues to have issues with a chronic cough.  She was supposed to take a combination of medications and follow-up in 3 months but after the Covid pandemic shutdown started, she has not been taking all the medications as they were not available through this pharmacy.  She is currently only taking pantoprazole.  Per chart, she was also supposed to be on fexofenadine/Allegra.  She reports that this was not available from the pharmacy.  She did try both Zyrtec and Xyzal however these made her too drowsy.  She does continue to have a dry, nonproductive cough.  She has not yet scheduled follow-up with pulmonology.         She has not had blood work done in follow-up of her anemia and low white blood cell count.  She has an appointment later this week to receive her influenza immunization and have a PPD placed and she can have blood work done at that time.  She also had abnormal hemoglobin A1c in August of this year with a mild increase of 5.7.  She reports no increased thirst or urinary frequency.  She feels that her health is stable at this time.  No shortness of breath, no chest pain or palpitations, no abdominal pain-no nausea or vomiting.             Past Medical History:   Diagnosis Date  . Fibroid tumor     Past Surgical History:  Procedure Laterality Date  . BREAST EXCISIONAL BIOPSY Left    2003 benign  . FOOT SURGERY    . MYRINGOTOMY WITH TUBE PLACEMENT    . SKIN GRAFT    . WISDOM TOOTH EXTRACTION      Family History  Problem Relation Age of Onset  . Hypertension Mother   . Hypertension Father   . Cancer Father        lungs  . Hypertension Sister   . Cancer Sister        pancreatitic  . Diabetes Maternal Aunt   . Diabetes Maternal Uncle     Social History   Tobacco Use  . Smoking status: Never Smoker  . Smokeless tobacco: Never Used  Substance Use Topics  . Alcohol use: Yes    Comment: occasionally  . Drug use: No     Allergies  Allergen Reactions  . Dilaudid [Hydromorphone Hcl] Nausea And Vomiting  . Singulair [Montelukast Sodium]        Observations/Objective: No vital signs or physical exam conducted as visit was done via telephone  Assessment and Plan: 1. Chronic cough 2. Chronic allergic rhinitis She reports that she continues to have issues with a chronic, dry cough.  She is currently taking pantoprazole as recommended by pulmonology but is not taking treatment  for her chronic allergic rhinitis and has been told that postnasal drainage may be contributing to her chronic cough.  She still has Xyzal but reports that the medication caused her to feel too drowsy.  Suggested that patient take half a pill at bedtime to see if she can tolerate this as she may get some relief from her postnasal drainage on a lower dose without the excessive drowsiness.  She should call when she decides that she would like to be referred back to pulmonology for further evaluation.  3. Prediabetes Patient with hemoglobin A1c in August 2020 of 5.7.  She will have upcoming lab visit to have a repeat hemoglobin A1c and basic metabolic panel.  She will be notified based on results if other medications such as Metformin is recommended versus continued low  carbohydrate diet/dietary changes and regular exercise. -BMP; Future -Hgb A1c; Future  4. Iron deficiency anemia, unspecified iron deficiency anemia type 5. Leukopenia, unspecified type She will have CBC when she comes into the office on 08/03/2019 for immunizations. She will be notified of the results and if any future treatment is needed based on the results. -CBC with differential; Future -Iron panel; Future  Future Appointments  Date Time Provider [place]  08/03/2019  4:20 PM Daisy Blossom, Jarome Matin, RPH-CPP [facility] None    Follow Up Instructions:Return in about 5 months (around 12/28/2019) for chronic issues but sooner if abnormal labs on 08/03/2019.    I discussed the assessment and treatment plan with the patient. The patient was provided an opportunity to ask questions and all were answered. The patient agreed with the plan and demonstrated an understanding of the instructions.   The patient was advised to call back or seek an in-person evaluation if the symptoms worsen or if the condition fails to improve as anticipated.  I provided 11 minutes of non-face-to-face time during this encounter.   Antony Blackbird, MD

## 2019-08-03 ENCOUNTER — Other Ambulatory Visit: Payer: Self-pay

## 2019-08-03 ENCOUNTER — Ambulatory Visit: Payer: Self-pay | Attending: Family Medicine | Admitting: Pharmacist

## 2019-08-03 DIAGNOSIS — D509 Iron deficiency anemia, unspecified: Secondary | ICD-10-CM

## 2019-08-03 DIAGNOSIS — D72819 Decreased white blood cell count, unspecified: Secondary | ICD-10-CM

## 2019-08-03 DIAGNOSIS — R7303 Prediabetes: Secondary | ICD-10-CM

## 2019-08-03 DIAGNOSIS — Z23 Encounter for immunization: Secondary | ICD-10-CM

## 2019-08-03 NOTE — Progress Notes (Signed)
Patient presents for vaccination against tetanus and influenza per orders of Dr. Chapman Fitch. Consent given. Counseling provided. No contraindications exists. Vaccine administered without incident.

## 2019-08-03 NOTE — Addendum Note (Signed)
Addended bySteffanie Dunn on: 08/03/2019 04:31 PM   Modules accepted: Orders

## 2019-08-04 ENCOUNTER — Telehealth (INDEPENDENT_AMBULATORY_CARE_PROVIDER_SITE_OTHER): Payer: Self-pay

## 2019-08-04 ENCOUNTER — Other Ambulatory Visit: Payer: Self-pay | Admitting: Family Medicine

## 2019-08-04 DIAGNOSIS — D509 Iron deficiency anemia, unspecified: Secondary | ICD-10-CM

## 2019-08-04 LAB — HEMOGLOBIN A1C
Est. average glucose Bld gHb Est-mCnc: 120 mg/dL
Hgb A1c MFr Bld: 5.8 % — ABNORMAL HIGH (ref 4.8–5.6)

## 2019-08-04 LAB — BASIC METABOLIC PANEL WITH GFR
BUN/Creatinine Ratio: 19 (ref 9–23)
BUN: 16 mg/dL (ref 6–24)
CO2: 22 mmol/L (ref 20–29)
Calcium: 9.2 mg/dL (ref 8.7–10.2)
Chloride: 103 mmol/L (ref 96–106)
Creatinine, Ser: 0.85 mg/dL (ref 0.57–1.00)
GFR calc Af Amer: 94 mL/min/1.73
GFR calc non Af Amer: 82 mL/min/1.73
Glucose: 72 mg/dL (ref 65–99)
Potassium: 3.6 mmol/L (ref 3.5–5.2)
Sodium: 134 mmol/L (ref 134–144)

## 2019-08-04 LAB — CBC WITH DIFFERENTIAL/PLATELET
Basophils Absolute: 0 x10E3/uL (ref 0.0–0.2)
Basos: 1 %
EOS (ABSOLUTE): 0.1 x10E3/uL (ref 0.0–0.4)
Eos: 1 %
Hematocrit: 30.3 % — ABNORMAL LOW (ref 34.0–46.6)
Hemoglobin: 9.3 g/dL — ABNORMAL LOW (ref 11.1–15.9)
Immature Grans (Abs): 0 x10E3/uL (ref 0.0–0.1)
Immature Granulocytes: 0 %
Lymphocytes Absolute: 1.5 x10E3/uL (ref 0.7–3.1)
Lymphs: 37 %
MCH: 25.8 pg — ABNORMAL LOW (ref 26.6–33.0)
MCHC: 30.7 g/dL — ABNORMAL LOW (ref 31.5–35.7)
MCV: 84 fL (ref 79–97)
Monocytes Absolute: 0.5 x10E3/uL (ref 0.1–0.9)
Monocytes: 12 %
Neutrophils Absolute: 2 x10E3/uL (ref 1.4–7.0)
Neutrophils: 49 %
Platelets: 313 x10E3/uL (ref 150–450)
RBC: 3.6 x10E6/uL — ABNORMAL LOW (ref 3.77–5.28)
RDW: 13.2 % (ref 11.7–15.4)
WBC: 4 x10E3/uL (ref 3.4–10.8)

## 2019-08-04 LAB — IRON,TIBC AND FERRITIN PANEL
Ferritin: 7 ng/mL — ABNORMAL LOW (ref 15–150)
Iron Saturation: 5 % — CL (ref 15–55)
Iron: 20 ug/dL — ABNORMAL LOW (ref 27–159)
Total Iron Binding Capacity: 389 ug/dL (ref 250–450)
UIBC: 369 ug/dL (ref 131–425)

## 2019-08-04 MED ORDER — IRON (FERROUS SULFATE) 325 (65 FE) MG PO TABS: 325.0000 mg | ORAL_TABLET | Freq: Two times a day (BID) | ORAL | 5 refills | Status: DC

## 2019-08-04 NOTE — Telephone Encounter (Signed)
Patient verified date of birth. She is aware of Hgb A1c was 5.8 consistent with prediabetes. Normal basic metabolic panel. CBC shows hemoglobin of 9.3 consistent with anemia. The amount of available iron in your body is very low and iron is needed in order to make new red blood cells. A prescription has been sent to [facility] for ferrous sulfate 325 mg to take twice daily after meal. If you are unable to tolerate the iron supplement, please notify the office and a referral can be made for you to see a blood specialist to see if an iron transfusion is warranted. Please schedule a lab visit in 6 to 8 weeks to recheck your blood count and iron storage. She verbalized understanding of results. Nat Christen, CMA

## 2019-08-04 NOTE — Telephone Encounter (Signed)
-----   Message from Antony Blackbird, MD sent at 08/04/2019  1:44 PM EST ----- Hgb A1c was 5.8 consistent with prediabetes.  Normal basic metabolic panel.  CBC shows hemoglobin of 9.3 consistent with anemia.  The amount of available iron in your body is very low and iron is needed in order to make new red blood cells.  A prescription has been sent to [facility] for ferrous sulfate 325 mg to take twice daily after meal.  If you are unable to tolerate the iron supplement, please notify the office and a referral can be made for you to see a blood specialist to see if an iron transfusion is warranted.  Please schedule a lab visit in 6 to 8 weeks to recheck your blood count and iron storage.

## 2019-08-24 MED FILL — PANTOPRAZOLE SOD DR 40 MG T: 40 | 30 days supply | Qty: 30 | Fill #4

## 2019-08-28 ENCOUNTER — Telehealth: Payer: Self-pay

## 2019-08-28 NOTE — Telephone Encounter (Signed)
Health Coaching 3   Goals- Patient has started a new job recently that requires her to walk around more and climb more stairs. Patient feels that she is getting a good amount of exercise in daily at her job. Patient stated that her iron levels had recently come back low and that she was put on a new iron supplement. Patient has also started taking more supplements recently. Patient stated that she thinks she has had more energy since started the iron supplement and vitamins. Patient stated that she has also tried cutting back on her sodium intake.    New goal-  Reduce the amount of beverages with added sugar consumed (sodas).  Barrier to reaching goal- Patient stated that she has still been drinking at least 1 soda daily and that it has been hard to give them up completely.   Strategies to overcome- Patient stated that her preferred choice of soda is usually Pepsi or Dr. Malachi Bonds.  Encouraged patient that if she feels like she can't give up sodas completely to try and look or a lower sugar option. We discussed trying a gingerale or sprite instead or even a pepsi zero option.   Navigation:  Patient is aware of  a follow up session. Patient is scheduled for follow-up appointment on Wednesday, April 7th @ 8:30 am.   Time- 12 minutes

## 2019-09-07 ENCOUNTER — Ambulatory Visit: Payer: Self-pay | Attending: Internal Medicine

## 2019-09-07 DIAGNOSIS — Z23 Encounter for immunization: Secondary | ICD-10-CM

## 2019-09-07 NOTE — Progress Notes (Signed)
   Covid-19 Vaccination Clinic  Name:  [name]    MRN: [id number] DOB: 06-19-1971  09/07/2019  [name] was observed post Covid-19 immunization for 15 minutes without incident. She was provided with Vaccine Information Sheet and instruction to access the V-Safe system.   [name] was instructed to call 911 with any severe reactions post vaccine: Marland Kitchen Difficulty breathing  . Swelling of face and throat  . A fast heartbeat  . A bad rash all over body  . Dizziness and weakness   Immunizations Administered    Name Date Dose VIS Date Route   Pfizer COVID-19 Vaccine 09/07/2019  8:39 AM 0.3 mL 06/02/2019 Intramuscular   Manufacturer: [place]   Lot: [id number]   [place]: [id number]

## 2019-09-25 MED FILL — PANTOPRAZOLE SOD DR 40 MG T: 40 | 30 days supply | Qty: 30 | Fill #5

## 2019-09-27 ENCOUNTER — Inpatient Hospital Stay: Payer: Self-pay | Attending: Obstetrics and Gynecology | Admitting: *Deleted

## 2019-09-27 ENCOUNTER — Other Ambulatory Visit: Payer: Self-pay

## 2019-09-27 VITALS — BP 138/80 | Temp 97.3°F | Ht 64.75 in | Wt 152.0 lb

## 2019-09-27 DIAGNOSIS — Z0000 Encounter for general adult medical examination without abnormal findings: Secondary | ICD-10-CM

## 2019-09-27 NOTE — Progress Notes (Signed)
[facility] follow up   Clinical Measurement:  Height: 64.75 in Weight: 152 lb  Blood Pressure: 138/80  Blood Pressure #2: 138/80    Medical History:  Patient states that she does not have high cholesterol, blood pressure or diabetes.   Medications:  Patient states that she does not take medication to lower cholesterol,  blood pressure or blood sugar. Patient does not take an aspirin a day to help prevent a heart attack or stroke. During the past  days patient has taken prescribed medication to lower cholesterol on  days.  Blood pressure, self measurement:  Patient states that she does not measure blood pressure from home and has not been told to do so by a healthcare provider.    Nutrition:  Patient states that on average she eats 0 cups of fruit and 0 cups of vegetables per day. Patient states that she does not eat fish at least 2 times per week. In a typical day patient eats less than half servings of whole grains. Patient does not drink less than 36 ounces of beverages with added sugar weekly. Patient is not currently watching sodium or salt intake. In the past 7 days patient has had drinks containing alcohol on 1 day. On average when patient drinks alcoholic beverages she consumes 1 drink.      Physical activity:  Patient states that she gets 600 minutes of moderate and 0 minutes of vigorous physical activity each week.  Smoking status:  Patient states that she has never smoked tobacco.   Quality of life:  Over the past 2 weeks patient states that she has not had any days where she has little interest or pleasure in doing things and 0 days where she has felt down, depressed or hopeless.   Health Coaching: Encouraged patient to try and add more fruits and vegetables into daily diet. Explained that the recommendation is for 2 cups of fruit and 3 cups of vegetables per day. Also explained the importance of whole grains and heart healthy fish in diet. We also talked about cutting back on the amount  of sodas consumed. Patient currently drinks between 1-2 sodas daily. Explained that the recommendation is for 36 ounces or less per week. Encouraged patient to also start watching the amount of salt that she consumes and cooks with. Patient has lost 10 pounds since her initial visit. Encouraged patient to keep up the good work with her daily walking.  Navigation: This was the  follow up session for this patient, I will check up on her progress in the coming months.  Time: 20 minutes

## 2019-10-02 ENCOUNTER — Ambulatory Visit: Payer: Self-pay | Attending: Internal Medicine

## 2019-10-02 DIAGNOSIS — Z23 Encounter for immunization: Secondary | ICD-10-CM

## 2019-10-02 NOTE — Progress Notes (Signed)
   Covid-19 Vaccination Clinic  Name:  Gail Moreno    MRN: [id number] DOB: 06-Jun-1971  10/02/2019  [name] was observed post Covid-19 immunization for 15 minutes without incident. She was provided with Vaccine Information Sheet and instruction to access the V-Safe system.   [name] was instructed to call 911 with any severe reactions post vaccine: Marland Kitchen Difficulty breathing  . Swelling of face and throat  . A fast heartbeat  . A bad rash all over body  . Dizziness and weakness   Immunizations Administered    Name Date Dose VIS Date Route   Pfizer COVID-19 Vaccine 10/02/2019  8:21 AM 0.3 mL 06/02/2019 Intramuscular   Manufacturer: [place]   Lot: [id number]   [place]: [id number]

## 2019-10-02 NOTE — Progress Notes (Signed)
Patient seen and assessed by nursing staff during this encounter. I have reviewed the chart and agree with the documentation and plan. I have also made any necessary editorial changes.  Mora Bellman, MD 10/02/2019 11:02 AM

## 2020-01-19 ENCOUNTER — Ambulatory Visit: Payer: Self-pay

## 2020-01-19 NOTE — Telephone Encounter (Signed)
Offered pt office visit for 01/22/2020. Pt agreed/ Stressed the importance if symptoms persist or worsening to be evaluated in the UC/ED. Verbalized understanding

## 2020-01-19 NOTE — Telephone Encounter (Signed)
Pt. Reports she has had dizziness and vertigo x 1 week. States she "had inner ear years ago." Dizziness comes and goes. No other symptoms. No availability today or next week. Instructed pt. To go to UC/ED. States she "might". Would like to be worked in next week if possible. Please advise pt.  Reason for Disposition . [1] MODERATE dizziness (e.g., interferes with normal activities) AND [2] has NOT been evaluated by physician for this  (Exception: dizziness caused by heat exposure, sudden standing, or poor fluid intake)  Answer Assessment - Initial Assessment Questions 1. DESCRIPTION: "Describe your dizziness."     Dizzy 2. LIGHTHEADED: "Do you feel lightheaded?" (e.g., somewhat faint, woozy, weak upon standing)     Woozy 3. VERTIGO: "Do you feel like either you or the room is spinning or tilting?" (i.e. vertigo)     Yes 4. SEVERITY: "How bad is it?"  "Do you feel like you are going to faint?" "Can you stand and walk?"   - MILD: Feels slightly dizzy, but walking normally.   - MODERATE: Feels very unsteady when walking, but not falling; interferes with normal activities (e.g., school, work) .   - SEVERE: Unable to walk without falling, or requires assistance to walk without falling; feels like passing out now.      Mild 5. ONSET:  "When did the dizziness begin?"     1 week ago 6. AGGRAVATING FACTORS: "Does anything make it worse?" (e.g., standing, change in head position)     Movement 7. HEART RATE: "Can you tell me your heart rate?" "How many beats in 15 seconds?"  (Note: not all patients can do this)       No 8. CAUSE: "What do you think is causing the dizziness?"     Unsure 9. RECURRENT SYMPTOM: "Have you had dizziness before?" If Yes, ask: "When was the last time?" "What happened that time?"     Years ago 10. OTHER SYMPTOMS: "Do you have any other symptoms?" (e.g., fever, chest pain, vomiting, diarrhea, bleeding)       No 11. PREGNANCY: "Is there any chance you are pregnant?" "When was  your last menstrual period?"       No  Protocols used: DIZZINESS [facility]

## 2020-01-22 ENCOUNTER — Other Ambulatory Visit: Payer: Self-pay | Admitting: Family

## 2020-01-22 ENCOUNTER — Other Ambulatory Visit: Payer: Self-pay

## 2020-01-22 ENCOUNTER — Ambulatory Visit: Payer: BC Managed Care – PPO | Attending: Family | Admitting: Family

## 2020-01-22 DIAGNOSIS — D72819 Decreased white blood cell count, unspecified: Secondary | ICD-10-CM

## 2020-01-22 DIAGNOSIS — R7303 Prediabetes: Secondary | ICD-10-CM

## 2020-01-22 DIAGNOSIS — J309 Allergic rhinitis, unspecified: Secondary | ICD-10-CM

## 2020-01-22 DIAGNOSIS — R05 Cough: Secondary | ICD-10-CM

## 2020-01-22 DIAGNOSIS — R053 Chronic cough: Secondary | ICD-10-CM

## 2020-01-22 DIAGNOSIS — D509 Iron deficiency anemia, unspecified: Secondary | ICD-10-CM

## 2020-01-22 MED ORDER — IRON (FERROUS SULFATE) 325 (65 FE) MG PO TABS: 325.0000 mg | ORAL_TABLET | Freq: Two times a day (BID) | ORAL | 3 refills | Status: DC

## 2020-01-22 MED FILL — FERROUS SULFATE 325 MG TAB: 325 (65 FE) | 30 days supply | Qty: 60 | Fill #0

## 2020-01-22 NOTE — Progress Notes (Signed)
Virtual Visit via Telephone Note  I connected with Gail Moreno, on 01/22/2020 at 9:57 AM by telephone due to the COVID-19 pandemic and verified that I am speaking with the correct person using two identifiers.  Due to current restrictions/limitations of in-office visits due to the COVID-19 pandemic, this scheduled clinical appointment was converted to a telehealth visit.   Consent: I discussed the limitations, risks, security and privacy concerns of performing an evaluation and management service by telephone and the availability of in person appointments. I also discussed with the patient that there may be a patient responsible charge related to this service. The patient expressed understanding and agreed to proceed.  Location of Patient: Home  Location of Provider: [facility] [place]  Persons participating in Telemedicine visit: Destiny Hagin, NP Orlan Leavens, CMA  History of Present Illness: Gail Moreno is a 49 year old female with history of fibroid uterus, menorrhagia with irregular cycle, iron deficiency anemia, and exertional dyspnea who presents for follow-up of chronic conditions.   1. PRE-DIABETES FOLLOW-UP: Last A1C: 5.8% on 08/03/2019 Diet Adherence: []  Yes    [x]  No Exercise: [x]  Yes, at her job Last visit 07/31/2019 with Dr. Chapman Fitch. During that encounter hemoglobin A1C obtained.   2. ANEMIA & LEUKOPENIA FOLLOW-UP: Anemia status: stable Etiology of anemia: Duration of anemia treatment: 6 months Compliance with treatment: fair compliance Iron supplementation side effects: no Severity of anemia: moderate Fatigue: yes  lack of sleep Decreased exercise tolerance: no  Dyspnea on exertion: no Palpitations: no Bleeding: no Pica: no Last visit 07/31/2019 with Dr. Chapman Fitch. During that encounter prescribed iron (ferrous sulfate).   3. ALLERGIES FOLLOW-UP: Duration: chronic Runny nose: no none  Nasal congestion: no Nasal itching: no Sneezing:  yes Eye swelling, itching or discharge: no Post nasal drip: yes Cough: yes Sinus pressure: sometimes  Ear pain: no none Ear pressure: no none Fever: no none  Satisfied with current treatment: yes Allergist evaluation in past: yes ENT evaluation in past: no Current allergy medications: allegra, flonase and xyzal Last visit 07/31/2019 with Dr. Chapman Fitch. During that encounter continued on Pantoprazole as recommended by Pulmonology. Advised to start taking half a pill of Xyzal to relieve some of the excessive drowsiness she was experiencing. Advised to call for referral when she is ready to return to Pulmonology. Today reports she was told by her pulmonologist to not cough, clear throat, and talk for 3 days and to take medications for at least 3 months. Reports she was unable to do that. Reports she is ready to return to Pulmonology for follow-up.  Past Medical History:  Diagnosis Date  . Fibroid tumor    Allergies  Allergen Reactions  . Dilaudid [Hydromorphone Hcl] Nausea And Vomiting  . Singulair [Montelukast Sodium]     Current Outpatient Medications on File Prior to Visit  Medication Sig Dispense Refill  . famotidine (PEPCID) 20 MG tablet Take 1 tablet (20 mg total) by mouth 2 (two) times daily. (Patient not taking: Reported on 09/27/2019) 30 tablet 11  . fexofenadine (ALLEGRA) 180 MG tablet Take 1 tablet (180 mg total) by mouth daily. (Patient not taking: Reported on 09/27/2019) 30 tablet 11  . fluticasone (FLONASE) 50 MCG/ACT nasal spray Place 2 sprays into both nostrils daily. 16 g 2  . Iron, Ferrous Sulfate, 325 (65 Fe) MG TABS Take 325 mg by mouth 2 (two) times daily after a meal. 60 tablet 5  . pantoprazole (PROTONIX) 40 MG tablet Take 1 tablet (40 mg total) by mouth  daily. 30 tablet 5   No current facility-administered medications on file prior to visit.    Observations/Objective: Alert and oriented x 3. Not in acute distress. Physical examination not completed as this is a  telemedicine visit.  Assessment and Plan: 1. Iron deficiency anemia, unspecified iron deficiency anemia type: 2. Leukopenia, unspecified type: - Continue Iron, Ferrous Sulfate as prescribed.  - Last CBC with differential and iron panel obtained 08/03/2019. - Repeat CBC and iron panel to be obtained within the next week as patient has been on iron therapy for 6 months. Patient states she will come to the office to have this lab complete.  - Follow-up with primary physician in 4 months or sooner if needed. - CBC; Future - Iron, TIBC and Ferritin Panel; Future - Iron, Ferrous Sulfate, 325 (65 Fe) MG TABS; Take 325 mg by mouth 2 (two) times daily after a meal.  Dispense: 60 tablet; Refill: 3  3. Prediabetes: - Last hemoglobin A1C obtained 08/03/2019. At that time patient was pre-diabetic.  - Repeat hemoglobin A1C within the next week to check prediabetes level of control. Patient states she will come to the office to have this lab complete.  - Discussed the importance of healthy eating habits, low-carbohydrate diet, low-sugar diet, regular aerobic exercise (at least 150 minutes a week as tolerated) to achieve or maintain control of prediabetes. - Follow-up with primary physician in 4 months or sooner if needed. - Hemoglobin A1c; Future   4. Chronic allergic rhinitis: 5. Chronic cough: - Stable - Patient reports she is still having postnasal drip, sneezing, and coughing.  - Reports she has been unable to continue medications as prescribed by Pulmonology as she was unable to get refills.  - Continue Pantoprazole as recommended by Pulmonology. - Continue Xyzal as prescribed by Pulmonology.  - Referral to Pulmonology per patient request and per patient's primary physician on previous encounter.  - Referral to Pulmonology for further evaluation and management.  - Ambulatory referral to Pulmonology  Follow Up Instructions: Referral to Pulmonology. Labs within the next week. Follow-up with primary  physician in 4 months or sooner if needed.   Patient was given clear instructions to go to Emergency Department or return to medical center if symptoms don't improve, worsen, or new problems develop.The patient verbalized understanding.  I discussed the assessment and treatment plan with the patient. The patient was provided an opportunity to ask questions and all were answered. The patient agreed with the plan and demonstrated an understanding of the instructions.   The patient was advised to call back or seek an in-person evaluation if the symptoms worsen or if the condition fails to improve as anticipated.   I provided 15 minutes total of non-face-to-face time during this encounter including median intraservice time, reviewing previous notes, labs, imaging, medications, management and patient verbalized understanding.   Camillia Herter, NP  [facility] [place], [place]   01/22/2020, 7:52 AM

## 2020-01-22 NOTE — Patient Instructions (Addendum)
Labs this week. Continue ferrous sulfate for anemia. Referral to Pulmonology for chronic cough and chronic allergic rhinitis. Follow-up with primary physician in 4 months or sooner if needed.  Cough, Adult A cough helps to clear your throat and lungs. A cough may be a sign of an illness or another medical condition. An acute cough may only last 2-3 weeks, while a chronic cough may last 8 or more weeks. Many things can cause a cough. They include:  Germs (viruses or bacteria) that attack the airway.  Breathing in things that bother (irritate) your lungs.  Allergies.  Asthma.  Mucus that runs down the back of your throat (postnasal drip).  Smoking.  Acid backing up from the stomach into the tube that moves food from the mouth to the stomach (gastroesophageal reflux).  Some medicines.  Lung problems.  Other medical conditions, such as heart failure or a blood clot in the lung (pulmonary embolism). Follow these instructions at home: Medicines  Take over-the-counter and prescription medicines only as told by your doctor.  Talk with your doctor before you take medicines that stop a cough (coughsuppressants). Lifestyle   Do not smoke, and try not to be around smoke. Do not use any products that contain nicotine or tobacco, such as cigarettes, e-cigarettes, and chewing tobacco. If you need help quitting, ask your doctor.  Drink enough fluid to keep your pee (urine) pale yellow.  Avoid caffeine.  Do not drink alcohol if your doctor tells you not to drink. General instructions   Watch for any changes in your cough. Tell your doctor about them.  Always cover your mouth when you cough.  Stay away from things that make you cough, such as perfume, candles, campfire smoke, or cleaning products.  If the air is dry, use a cool mist vaporizer or humidifier in your home.  If your cough is worse at night, try using extra pillows to raise your head up higher while you sleep.  Rest as  needed.  Keep all follow-up visits as told by your doctor. This is important. Contact a doctor if:  You have new symptoms.  You cough up pus.  Your cough does not get better after 2-3 weeks, or your cough gets worse.  Cough medicine does not help your cough and you are not sleeping well.  You have pain that gets worse or pain that is not helped with medicine.  You have a fever.  You are losing weight and you do not know why.  You have night sweats. Get help right away if:  You cough up blood.  You have trouble breathing.  Your heartbeat is very fast. These symptoms may be an emergency. Do not wait to see if the symptoms will go away. Get medical help right away. Call your local emergency services (911 in the [place] Do not drive yourself to the hospital. Summary  A cough helps to clear your throat and lungs. Many things can cause a cough.  Take over-the-counter and prescription medicines only as told by your doctor.  Always cover your mouth when you cough.  Contact a doctor if you have new symptoms or you have a cough that does not get better or gets worse. This information is not intended to replace advice given to you by your health care provider. Make sure you discuss any questions you have with your health care provider. Document Revised: 06/27/2018 Document Reviewed: 06/27/2018 Elsevier Patient Education  [place].

## 2020-01-25 ENCOUNTER — Ambulatory Visit: Payer: BC Managed Care – PPO | Attending: Family Medicine

## 2020-01-25 ENCOUNTER — Other Ambulatory Visit: Payer: Self-pay

## 2020-01-25 DIAGNOSIS — R7303 Prediabetes: Secondary | ICD-10-CM | POA: Diagnosis not present

## 2020-01-25 DIAGNOSIS — D509 Iron deficiency anemia, unspecified: Secondary | ICD-10-CM

## 2020-01-26 LAB — IRON,TIBC AND FERRITIN PANEL
Ferritin: 28 ng/mL (ref 15–150)
Iron Saturation: 13 % — ABNORMAL LOW (ref 15–55)
Iron: 40 ug/dL (ref 27–159)
Total Iron Binding Capacity: 301 ug/dL (ref 250–450)
UIBC: 261 ug/dL (ref 131–425)

## 2020-01-26 LAB — HEMOGLOBIN A1C
Est. average glucose Bld gHb Est-mCnc: 108 mg/dL
Hgb A1c MFr Bld: 5.4 % (ref 4.8–5.6)

## 2020-01-26 LAB — CBC
Hematocrit: 34.4 % (ref 34.0–46.6)
Hemoglobin: 11.2 g/dL (ref 11.1–15.9)
MCH: 29.2 pg (ref 26.6–33.0)
MCHC: 32.6 g/dL (ref 31.5–35.7)
MCV: 90 fL (ref 79–97)
Platelets: 248 10*3/uL (ref 150–450)
RBC: 3.84 x10E6/uL (ref 3.77–5.28)
RDW: 12.8 % (ref 11.7–15.4)
WBC: 5.3 10*3/uL (ref 3.4–10.8)

## 2020-01-26 NOTE — Progress Notes (Signed)
Please call patient with update.   Anemia has improved since labs last obtained. Continue Ferrous Sulfate as prescribed for anemia. Increase iron-rich foods such as dark green leafy vegetables and dried fruit such as raisins and apricots.   Hemoglobin A1C normal. This improved since last obtained at which time you were pre-diabetic. Continue to practice healthy eating habits, low-carbohydrate diet, low-sugar diet, regular aerobic exercise (at least 150 minutes a week as tolerated) and medication compliance to achieve or maintain control of diabetes.  Follow-up with primary physician at your next visit or sooner if needed.

## 2020-01-29 ENCOUNTER — Telehealth: Payer: Self-pay

## 2020-01-29 NOTE — Telephone Encounter (Signed)
-----   Message from Jackelyn Knife, [place] sent at 01/29/2020  2:35 PM EDT -----  ----- Message ----- From: Camillia Herter, NP Sent: 01/26/2020   3:12 PM EDT To: Jackelyn Knife, RMA  Please call patient with update.   Anemia has improved since labs last obtained. Continue Ferrous Sulfate as prescribed for anemia. Increase iron-rich foods such as dark green leafy vegetables and dried fruit such as raisins and apricots.   Hemoglobin A1C normal. This improved since last obtained at which time you were pre-diabetic. Continue to practice healthy eating habits, low-carbohydrate diet, low-sugar diet, regular aerobic exercise (at least 150 minutes a week as tolerated) and m edication compliance to achieve or maintain control of diabetes.  Follow-up with primary physician at your next visit or sooner if needed.

## 2020-01-29 NOTE — Progress Notes (Signed)
Pt was called @2 :52 to discuss her labs. Pt stated she understood her labs and will come to her f/u appt when she receives it.

## 2020-02-29 ENCOUNTER — Ambulatory Visit: Payer: Self-pay | Admitting: *Deleted

## 2020-02-29 NOTE — Telephone Encounter (Signed)
  C/o dizziness and loss of balance x 1 week. Dizziness occurs with sitting or standing . Spinning and tilting sensation at times. Has not checked B/P. Hx anemia. LMP was this past week. Requires holding on to objects at times when walking . Denies visual changes, weakness on one side, slurred speech. Reports missing a few doses of iron pills. No appt available today and patient reports she will call back to set up appt. Care advise given. Patient verbalized understanding of care advise and to call back or go to [facility] or ED if symptoms worsen.  Reason for Disposition . [1] MODERATE dizziness (e.g., vertigo; feels very unsteady, interferes with normal activities) AND [2] has been evaluated by physician for this  Answer Assessment - Initial Assessment Questions 1. DESCRIPTION: "Describe your dizziness."     Loose balance when walking and dizziness when sitting 2. VERTIGO: "Do you feel like either you or the room is spinning or tilting?"      Spinning and tilting  3. LIGHTHEADED: "Do you feel lightheaded?" (e.g., somewhat faint, woozy, weak upon standing)     Woozy ,weak upon standing  4. SEVERITY: "How bad is it?"  "Can you walk?"   - MILD: Feels unsteady but walking normally.   - MODERATE: Feels very unsteady when walking, but not falling; interferes with normal activities (e.g., school, work) .   - SEVERE: Unable to walk without falling, or requires assistance to walk without falling.     Moderate  5. ONSET:  "When did the dizziness begin?"     1 week ago  6. AGGRAVATING FACTORS: "Does anything make it worse?" (e.g., standing, change in head position)     No  7. CAUSE: "What do you think is causing the dizziness?"     Not sure  8. RECURRENT SYMPTOM: "Have you had dizziness before?" If Yes, ask: "When was the last time?" "What happened that time?"     Last office visit . Was anemic 9. OTHER SYMPTOMS: "Do you have any other symptoms?" (e.g., headache, weakness, numbness, vomiting, earache)      Neck pain, stiff neck  10. PREGNANCY: "Is there any chance you are pregnant?" "When was your last menstrual period?"       Does not think so on period now  Protocols used: DIZZINESS - VERTIGO-A-AH

## 2020-02-29 NOTE — Telephone Encounter (Signed)
Please see if patient can get an appointment at any of the practices or please call patient to recommend urgent care if patient with continued issues with dizziness

## 2020-02-29 NOTE — Telephone Encounter (Signed)
Will  forward to pt pcp

## 2020-03-01 NOTE — Telephone Encounter (Signed)
Spoke w/ pt and scheduled appt w/ Dr. Chapman Fitch for 9/29 @ 8:50am, pt advised to seek urgent care if any worsening or new symptoms prior to appt, pt verbalized understanding.

## 2020-03-10 ENCOUNTER — Encounter (HOSPITAL_BASED_OUTPATIENT_CLINIC_OR_DEPARTMENT_OTHER): Payer: Self-pay | Admitting: Emergency Medicine

## 2020-03-10 ENCOUNTER — Emergency Department (HOSPITAL_BASED_OUTPATIENT_CLINIC_OR_DEPARTMENT_OTHER)
Admission: EM | Admit: 2020-03-10 | Discharge: 2020-03-11 | Disposition: A | Discharge status: Home / Self Care | Payer: BC Managed Care – PPO | Attending: Emergency Medicine | Admitting: Emergency Medicine

## 2020-03-10 ENCOUNTER — Other Ambulatory Visit: Payer: Self-pay

## 2020-03-10 DIAGNOSIS — R0981 Nasal congestion: Secondary | ICD-10-CM | POA: Insufficient documentation

## 2020-03-10 DIAGNOSIS — R42 Dizziness and giddiness: Secondary | ICD-10-CM | POA: Diagnosis not present

## 2020-03-10 HISTORY — DX: Anemia, unspecified: D64.9

## 2020-03-10 NOTE — ED Triage Notes (Signed)
States she has been having dizzy spells (room spinning) "for weeks". She has an appt with her PCP 9/29 but states she had 3-4 spells today and became concerned.

## 2020-03-11 LAB — URINALYSIS, MICROSCOPIC (REFLEX)

## 2020-03-11 LAB — CBC
HCT: 34.1 % — ABNORMAL LOW (ref 36.0–46.0)
Hemoglobin: 11.3 g/dL — ABNORMAL LOW (ref 12.0–15.0)
MCH: 30.4 pg (ref 26.0–34.0)
MCHC: 33.1 g/dL (ref 30.0–36.0)
MCV: 91.7 fL (ref 80.0–100.0)
Platelets: 238 10*3/uL (ref 150–400)
RBC: 3.72 MIL/uL — ABNORMAL LOW (ref 3.87–5.11)
RDW: 13 % (ref 11.5–15.5)
WBC: 3.9 10*3/uL — ABNORMAL LOW (ref 4.0–10.5)
nRBC: 0 % (ref 0.0–0.2)

## 2020-03-11 LAB — BASIC METABOLIC PANEL
Anion gap: 9 (ref 5–15)
BUN: 15 mg/dL (ref 6–20)
CO2: 22 mmol/L (ref 22–32)
Calcium: 9.1 mg/dL (ref 8.9–10.3)
Chloride: 107 mmol/L (ref 98–111)
Creatinine, Ser: 0.77 mg/dL (ref 0.44–1.00)
GFR calc Af Amer: >60 mL/min (ref >60)
GFR calc non Af Amer: >60 mL/min (ref >60)
Glucose, Bld: 87 mg/dL (ref 70–99)
Potassium: 4.4 mmol/L (ref 3.5–5.1)
Sodium: 138 mmol/L (ref 135–145)

## 2020-03-11 LAB — URINALYSIS, ROUTINE W REFLEX MICROSCOPIC
Bilirubin Urine: NEGATIVE
Glucose, UA: NEGATIVE mg/dL
Ketones, ur: NEGATIVE mg/dL
Nitrite: NEGATIVE
Protein, ur: NEGATIVE mg/dL
Specific Gravity, Urine: 1.02 (ref 1.005–1.030)
pH: 6 (ref 5.0–8.0)

## 2020-03-11 LAB — PREGNANCY, URINE: Preg Test, Ur: NEGATIVE

## 2020-03-11 LAB — CBG MONITORING, ED: Glucose-Capillary: 88 mg/dL (ref 70–99)

## 2020-03-11 MED ORDER — MECLIZINE HCL 25 MG PO TABS: 25.0000 mg | ORAL_TABLET | Freq: Three times a day (TID) | ORAL | 0 refills | Status: DC | PRN

## 2020-03-11 NOTE — ED Notes (Signed)
CBG 88 

## 2020-03-11 NOTE — ED Provider Notes (Signed)
[facility] Provider Note   CSN: [id number] Arrival date & time: 03/10/20  2106     History Chief Complaint  Patient presents with   Dizziness    Gail Moreno is a 49 y.o. female.  HPI     This a 49 year old female with a history of anemia who presents with dizziness.  Patient reports intermittent episodes of room spinning dizziness.  She states they occur randomly and seem to self resolved.  No specific nausea or vomiting noted.  Unclear whether there is a positional component although she does state that sometimes when she lays down she has more significant symptoms.  She denies any weakness, numbness, tingling, gait disturbance, strokelike symptoms.  She states that she got concerned when she had 3-4 episodes today.  She also had an episode while driving.  Again self resolved.  She is currently symptom-free.  She denies any headache.  Denies any recent sick contacts or Covid exposures.  Denies any upper respiratory symptoms but states that over the last day she does feel like her nose is "more stuffy."  No fevers.  Past Medical History:  Diagnosis Date   Anemia    Fibroid tumor     Patient Active Problem List   Diagnosis Date Noted   Well woman exam with routine gynecological exam 01/26/2019   Exertional dyspnea 04/11/2018   Cough 04/11/2018   Menorrhagia with irregular cycle 03/20/2016   Fibroid uterus 03/20/2016   Iron deficiency anemia 03/20/2016    Past Surgical History:  Procedure Laterality Date   BREAST EXCISIONAL BIOPSY Left    2003 benign   FOOT SURGERY     MYRINGOTOMY WITH TUBE PLACEMENT     SKIN GRAFT     WISDOM TOOTH EXTRACTION       OB History    Gravida  1   Para  1   Term  1   Preterm      AB      Living  1     SAB      TAB      Ectopic      Multiple      Live Births              Family History  Problem Relation Age of Onset   Hypertension Mother    Hypertension Father     Cancer Father        lungs   Hypertension Sister    Cancer Sister        pancreatitic   Diabetes Maternal Aunt    Diabetes Maternal Uncle     Social History   Tobacco Use   Smoking status: Never Smoker   Smokeless tobacco: Never Used  Vaping Use   Vaping Use: Never used  Substance Use Topics   Alcohol use: Yes    Comment: occasionally   Drug use: No    Home Medications Prior to Admission medications   Medication Sig Start Date End Date Taking? Authorizing Provider  famotidine (PEPCID) 20 MG tablet Take 1 tablet (20 mg total) by mouth 2 (two) times daily. Patient not taking: Reported on 09/27/2019 03/02/19   Noemi Chapel P, DO  fexofenadine (ALLEGRA) 180 MG tablet Take 1 tablet (180 mg total) by mouth daily. Patient not taking: Reported on 09/27/2019 03/02/19   Noemi Chapel P, DO  fluticasone [facility]) 50 MCG/ACT nasal spray Place 2 sprays into both nostrils daily. 03/02/19   Julian Hy, DO  Iron, Ferrous Sulfate, 325 (  65 Fe) MG TABS Take 325 mg by mouth 2 (two) times daily after a meal. 01/22/20 05/21/20  Camillia Herter, NP  meclizine (ANTIVERT) 25 MG tablet Take 1 tablet (25 mg total) by mouth 3 (three) times daily as needed for dizziness. 03/11/20   Treston Coker, Barbette Hair, MD  pantoprazole (PROTONIX) 40 MG tablet Take 1 tablet (40 mg total) by mouth daily. 03/27/19   Julian Hy, DO    Allergies    Dilaudid [hydromorphone hcl] and Singulair [montelukast sodium]  Review of Systems   Review of Systems  Constitutional: Negative for fever.  HENT: Positive for congestion. Negative for ear discharge and ear pain.   Respiratory: Negative for shortness of breath.   Gastrointestinal: Negative for abdominal pain, nausea and vomiting.  Neurological: Positive for dizziness. Negative for speech difficulty, weakness, numbness and headaches.  All other systems reviewed and are negative.   Physical Exam Updated Vital Signs BP 121/75    Pulse 75    Temp 98.7 F (37.1 C) (Oral)     Resp 13    Ht 1.6 m (5\' 3" )    Wt 65.8 kg    SpO2 100%    BMI 25.69 kg/m   Physical Exam Vitals and nursing note reviewed.  Constitutional:      Appearance: She is well-developed. She is not ill-appearing.  HENT:     Head: Normocephalic and atraumatic.     Right Ear: Tympanic membrane normal.     Left Ear: Tympanic membrane normal.     Nose: Nose normal.     Mouth/Throat:     Mouth: Mucous membranes are moist.  Eyes:     Pupils: Pupils are equal, round, and reactive to light.     Comments: No nystagmus noted  Cardiovascular:     Rate and Rhythm: Normal rate and regular rhythm.     Heart sounds: Normal heart sounds.  Pulmonary:     Effort: Pulmonary effort is normal. No respiratory distress.     Breath sounds: No wheezing.  Abdominal:     Palpations: Abdomen is soft.     Tenderness: There is no abdominal tenderness.  Musculoskeletal:     Cervical back: Neck supple.  Skin:    General: Skin is warm and dry.  Neurological:     Mental Status: She is alert and oriented to person, place, and time.     Comments: Cranial nerves II through XII intact, 5 out of 5 strength in all 4 extremities, no dysmetria to finger-nose-finger, normal gait  Psychiatric:        Mood and Affect: Mood normal.     ED Results / Procedures / Treatments   Labs (all labs ordered are listed, but only abnormal results are displayed) Labs Reviewed  CBC - Abnormal; Notable for the following components:      Result Value   WBC 3.9 (*)    RBC 3.72 (*)    Hemoglobin 11.3 (*)    HCT 34.1 (*)    All other components within normal limits  URINALYSIS, ROUTINE W REFLEX MICROSCOPIC - Abnormal; Notable for the following components:   Hgb urine dipstick SMALL (*)    Leukocytes,Ua SMALL (*)    All other components within normal limits  URINALYSIS, MICROSCOPIC (REFLEX) - Abnormal; Notable for the following components:   Bacteria, UA FEW (*)    All other components within normal limits  BASIC METABOLIC PANEL    PREGNANCY, URINE  CBG MONITORING, ED    EKG EKG  Interpretation  Date/Time:  Sunday March 10 2020 23:32:51 EDT Ventricular Rate:  73 PR Interval:    QRS Duration: 86 QT Interval:  382 QTC Calculation: 421 R Axis:   76 Text Interpretation: Sinus rhythm Abnormal R-wave progression, early transition Minimal ST elevation, inferior leads Confirmed by Deirdre Gryder ([id number]) on 03/11/2020 12:25:23 AM   Radiology No results found.  Procedures Procedures (including critical care time)  Medications Ordered in ED Medications - No data to display  ED Course  I have reviewed the triage vital signs and the nursing notes.  Pertinent labs & imaging results that were available during my care of the patient were reviewed by me and considered in my medical decision making (see chart for details).    MDM Rules/Calculators/A&P                           Patient presents with intermittent episodes of room spinning dizziness.  She is overall nontoxic and vital signs are reassuring.  She is afebrile.  She is currently asymptomatic.  Her history is most suggestive of benign peripheral vertigo.  Her neurologic exam is normal and given her age and otherwise low risk, would have lower suspicion for central cause.  She appears well-hydrated.  Lab work reviewed and no significant metabolic derangements.  Hemoglobin is 11.3.  I discussed with patient my suspicions that this may be benign positional vertigo.  I provided meclizine as needed.  Follow-up with PCP.  After history, exam, and medical workup I feel the patient has been appropriately medically screened and is safe for discharge home. Pertinent diagnoses were discussed with the patient. Patient was given return precautions.   Final Clinical Impression(s) / ED Diagnoses Final diagnoses:  Vertigo    Rx / DC Orders ED Discharge Orders         Ordered    meclizine (ANTIVERT) 25 MG tablet  3 times daily PRN        09 /20/21 0041            Daronte Shostak, Barbette Hair, MD 03/11/20 [phone]

## 2020-03-11 NOTE — Discharge Instructions (Addendum)
You were seen today for dizziness.  Your symptoms are suggestive of vertigo.  Take meclizine as needed.  Make sure that you are staying hydrated.  Follow-up with your primary care doctor as needed.

## 2020-03-20 ENCOUNTER — Other Ambulatory Visit: Payer: Self-pay

## 2020-03-20 ENCOUNTER — Encounter: Payer: Self-pay | Admitting: Family Medicine

## 2020-03-20 ENCOUNTER — Other Ambulatory Visit (HOSPITAL_COMMUNITY)
Admission: RE | Admit: 2020-03-20 | Discharge: 2020-03-20 | Disposition: A | Discharge status: Home / Self Care | Payer: BC Managed Care – PPO | Source: Ambulatory Visit | Attending: Family Medicine | Admitting: Family Medicine

## 2020-03-20 ENCOUNTER — Ambulatory Visit: Payer: BC Managed Care – PPO | Attending: Family Medicine | Admitting: Family Medicine

## 2020-03-20 ENCOUNTER — Other Ambulatory Visit: Payer: Self-pay | Admitting: Family Medicine

## 2020-03-20 VITALS — BP 109/73 | HR 81 | Temp 97.0°F | Resp 16 | Ht 63.0 in | Wt 148.0 lb

## 2020-03-20 DIAGNOSIS — J309 Allergic rhinitis, unspecified: Secondary | ICD-10-CM

## 2020-03-20 DIAGNOSIS — Z09 Encounter for follow-up examination after completed treatment for conditions other than malignant neoplasm: Secondary | ICD-10-CM | POA: Diagnosis not present

## 2020-03-20 DIAGNOSIS — D509 Iron deficiency anemia, unspecified: Secondary | ICD-10-CM

## 2020-03-20 DIAGNOSIS — R82998 Other abnormal findings in urine: Secondary | ICD-10-CM | POA: Diagnosis not present

## 2020-03-20 DIAGNOSIS — A599 Trichomoniasis, unspecified: Secondary | ICD-10-CM | POA: Diagnosis not present

## 2020-03-20 DIAGNOSIS — N760 Acute vaginitis: Secondary | ICD-10-CM | POA: Insufficient documentation

## 2020-03-20 DIAGNOSIS — D649 Anemia, unspecified: Secondary | ICD-10-CM

## 2020-03-20 DIAGNOSIS — R350 Frequency of micturition: Secondary | ICD-10-CM

## 2020-03-20 DIAGNOSIS — D259 Leiomyoma of uterus, unspecified: Secondary | ICD-10-CM

## 2020-03-20 DIAGNOSIS — R058 Other specified cough: Secondary | ICD-10-CM

## 2020-03-20 DIAGNOSIS — N898 Other specified noninflammatory disorders of vagina: Secondary | ICD-10-CM

## 2020-03-20 DIAGNOSIS — R42 Dizziness and giddiness: Secondary | ICD-10-CM

## 2020-03-20 DIAGNOSIS — R05 Cough: Secondary | ICD-10-CM

## 2020-03-20 DIAGNOSIS — B9689 Other specified bacterial agents as the cause of diseases classified elsewhere: Secondary | ICD-10-CM | POA: Insufficient documentation

## 2020-03-20 LAB — POCT URINALYSIS DIP (CLINITEK)
Bilirubin, UA: NEGATIVE
Glucose, UA: NEGATIVE mg/dL
Ketones, POC UA: NEGATIVE mg/dL
Nitrite, UA: NEGATIVE
POC PROTEIN,UA: NEGATIVE
Spec Grav, UA: >=1.03 — AB
Urobilinogen, UA: 0.2 U/dL
pH, UA: 6

## 2020-03-20 MED ORDER — SULFAMETHOXAZOLE-TRIMETHOPRIM 800-160 MG PO TABS: 1.0000 | ORAL_TABLET | Freq: Two times a day (BID) | ORAL | 0 refills | Status: AC

## 2020-03-20 MED ORDER — SULFAMETHOXAZOLE-TRIMETHOPRIM 800-160 MG PO TABS: 1.0000 | ORAL_TABLET | Freq: Two times a day (BID) | ORAL | 0 refills | Status: DC

## 2020-03-20 NOTE — Progress Notes (Signed)
yurDizziness x 3-4 wks  Also has concerns w/ periods, LMP was 02/27/20 but started having brown spotting w/ cramps on 03/18/20.  Taking Vale 30 liquid MVI

## 2020-03-20 NOTE — Progress Notes (Signed)
Established Patient Office Visit  Subjective:  Patient ID: Gail Moreno, female    DOB: [birth year]  Age: 49 y.o. MRN: [id number]  CC:  Chief Complaint  Patient presents with  . Dizziness    x 3-4 wks    HPI Gail Moreno, 49 year old female, who is status post emergency department visit on 03/10/2020, after having acute onset of episodes of recurrent dizziness which initially started while she was still lying down in bed.  She does report a history of vertigo.  She reports that her symptoms have improved.  On the day of her initial dizziness, she reports recurrent waves of dizziness and felt as if she might fall secondary to the dizziness.  She also had recurrent nausea along with her dizziness.  She reports that she has only had one brief episode of dizziness yesterday and then again this morning as compared to multiple episodes per day when the dizziness first started.          She also has a history of chronic anemia related to uterine fibroids.  She states that she did follow-up with GYN and the initial discussion/plan was for partial hysterectomy however patient states that her bleeding decreased therefore hysterectomy was not done.  She still has some occasional lower abdominal pain around the time of her menses and occasional spotting which she was told by GYN is related to her fibroids.  She has noticed a recent increase in urinary frequency and did have recent onset of a brownish discharge/spotting on 03/18/2020 and her last menstrual cycle was on 02/27/2020.           She has had some recent increase in nasal congestion, postnasal drainage and ear pressure but is not currently taking her medications for allergic rhinitis.  She denies any recent fever or chills.  She has had recurrent, nonproductive cough.  Cough seems to be slightly worse at night when she lies down.  Past Medical History:  Diagnosis Date  . Anemia   . Fibroid tumor     Past Surgical History:  Procedure Laterality Date   . BREAST EXCISIONAL BIOPSY Left    2003 benign  . FOOT SURGERY    . MYRINGOTOMY WITH TUBE PLACEMENT    . SKIN GRAFT    . WISDOM TOOTH EXTRACTION      Family History  Problem Relation Age of Onset  . Hypertension Mother   . Hypertension Father   . Cancer Father        lungs  . Hypertension Sister   . Cancer Sister        pancreatitic  . Diabetes Maternal Aunt   . Diabetes Maternal Uncle     Social History   Socioeconomic History  . Marital status: Divorced    Spouse name: Not on file  . Number of children: 1  . Years of education: Not on file  . Highest education level: Associate degree: occupational, Hotel manager, or vocational program  Occupational History  . Not on file  Tobacco Use  . Smoking status: Never Smoker  . Smokeless tobacco: Never Used  Vaping Use  . Vaping Use: Never used  Substance and Sexual Activity  . Alcohol use: Yes    Comment: occasionally  . Drug use: No  . Sexual activity: Yes    Birth control/protection: None  Other Topics Concern  . Not on file  Social History Narrative  . Not on file   Social Determinants of Health   Financial Resource Strain:   .  Difficulty of Paying Living Expenses: Not on file  Food Insecurity:   . Worried About Charity fundraiser in the Last Year: Not on file  . Ran Out of Food in the Last Year: Not on file  Transportation Needs: No Transportation Needs  . Lack of Transportation (Medical): No  . Lack of Transportation (Non-Medical): No  Physical Activity:   . Days of Exercise per Week: Not on file  . Minutes of Exercise per Session: Not on file  Stress:   . Feeling of Stress : Not on file  Social Connections:   . Frequency of Communication with Friends and Family: Not on file  . Frequency of Social Gatherings with Friends and Family: Not on file  . Attends Religious Services: Not on file  . Active Member of Clubs or Organizations: Not on file  . Attends Archivist Meetings: Not on file  .  Marital Status: Not on file  Intimate Partner Violence:   . Fear of Current or Ex-Partner: Not on file  . Emotionally Abused: Not on file  . Physically Abused: Not on file  . Sexually Abused: Not on file    Outpatient Medications Prior to Visit  Medication Sig Dispense Refill  . Iron, Ferrous Sulfate, 325 (65 Fe) MG TABS Take 325 mg by mouth 2 (two) times daily after a meal. 60 tablet 3  . famotidine (PEPCID) 20 MG tablet Take 1 tablet (20 mg total) by mouth 2 (two) times daily. (Patient not taking: Reported on 09/27/2019) 30 tablet 11  . fexofenadine (ALLEGRA) 180 MG tablet Take 1 tablet (180 mg total) by mouth daily. (Patient not taking: Reported on 09/27/2019) 30 tablet 11  . fluticasone (FLONASE) 50 MCG/ACT nasal spray Place 2 sprays into both nostrils daily. (Patient not taking: Reported on 03/20/2020) 16 g 2  . pantoprazole (PROTONIX) 40 MG tablet Take 1 tablet (40 mg total) by mouth daily. (Patient not taking: Reported on 03/20/2020) 30 tablet 5  . meclizine (ANTIVERT) 25 MG tablet Take 1 tablet (25 mg total) by mouth 3 (three) times daily as needed for dizziness. (Patient not taking: Reported on 03/20/2020) 30 tablet 0   No facility-administered medications prior to visit.    Allergies  Allergen Reactions  . Dilaudid [Hydromorphone Hcl] Nausea And Vomiting  . Meclizine Hcl Other (See Comments)    Caused confusion and sluggishness  . Singulair [Montelukast Sodium]     ROS Review of Systems  Constitutional: Negative for chills and fever.  HENT: Positive for congestion, postnasal drip and rhinorrhea. Negative for ear pain, sore throat and trouble swallowing.   Eyes: Negative for photophobia and visual disturbance.  Respiratory: Positive for cough (chronic). Negative for shortness of breath.   Cardiovascular: Negative for chest pain, palpitations and leg swelling.  Gastrointestinal: Positive for abdominal pain (lower abdominal pressure) and diarrhea (x1 this weekend).  Endocrine:  Positive for polyuria. Negative for polydipsia and polyphagia.  Genitourinary: Positive for frequency and menstrual problem. Negative for dysuria.  Musculoskeletal: Negative for arthralgias and back pain.  Skin: Negative for rash and wound.  Neurological: Positive for dizziness and headaches (frontal headache x 1 yesterday).  Hematological: Negative for adenopathy. Does not bruise/bleed easily.  Psychiatric/Behavioral: Negative for suicidal ideas. The patient is not nervous/anxious.       Objective:    Physical Exam Vitals and nursing note reviewed.  Constitutional:      Appearance: Normal appearance.  HENT:     Right Ear: Ear canal and external ear normal.  Left Ear: Ear canal and external ear normal.     Ears:     Comments: Subacute fluid behind each TM    Nose: Congestion and rhinorrhea present.     Mouth/Throat:     Pharynx: Posterior oropharyngeal erythema present. No oropharyngeal exudate.  Eyes:     Extraocular Movements: Extraocular movements intact.     Conjunctiva/sclera: Conjunctivae normal.  Neck:     Vascular: No carotid bruit.  Cardiovascular:     Rate and Rhythm: Normal rate and regular rhythm.  Pulmonary:     Effort: Pulmonary effort is normal.     Breath sounds: Normal breath sounds.  Abdominal:     Palpations: Abdomen is soft.     Tenderness: There is abdominal tenderness (right lower quadrant and suprapubic pressure with palp). There is no right CVA tenderness, left CVA tenderness, guarding or rebound.  Musculoskeletal:        General: No tenderness.     Cervical back: Normal range of motion and neck supple. No rigidity or tenderness.     Right lower leg: No edema.     Left lower leg: No edema.  Lymphadenopathy:     Cervical: No cervical adenopathy.  Skin:    General: Skin is warm and dry.  Neurological:     General: No focal deficit present.     Mental Status: She is alert and oriented to person, place, and time.  Psychiatric:        Mood and  Affect: Mood normal.        Behavior: Behavior normal.     BP 109/73 (BP Location: Left Arm, Patient Position: Sitting, Cuff Size: Normal)   Pulse 81   Temp (!) 97 F (36.1 C) (Temporal)   Resp 16   Ht 5\' 3"  (1.6 m)   Wt 148 lb (67.1 kg)   LMP 02/27/2020 (Exact Date)   SpO2 99%   BMI 26.22 kg/m  Wt Readings from Last 3 Encounters:  03/20/20 148 lb (67.1 kg)  03/10/20 145 lb (65.8 kg)  09/27/19 152 lb (68.9 kg)     Health Maintenance Due  Topic Date Due  . Hepatitis C Screening  Never done      Lab Results  Component Value Date   TSH 1.96 03/20/2016   Lab Results  Component Value Date   WBC 3.9 (L) 03/11/2020   HGB 11.3 (L) 03/11/2020   HCT 34.1 (L) 03/11/2020   MCV 91.7 03/11/2020   PLT 238 03/11/2020   Lab Results  Component Value Date   NA 138 03/11/2020   K 4.4 03/11/2020   CO2 22 03/11/2020   GLUCOSE 87 03/11/2020   BUN 15 03/11/2020   CREATININE 0.77 03/11/2020   BILITOT 0.3 02/20/2019   ALKPHOS 62 02/20/2019   AST 25 02/20/2019   ALT 19 02/20/2019   PROT 7.3 02/20/2019   ALBUMIN 3.8 02/20/2019   CALCIUM 9.1 03/11/2020   ANIONGAP 9 03/11/2020   Lab Results  Component Value Date   CHOL 135 02/08/2019   Lab Results  Component Value Date   HDL 48 02/08/2019   Lab Results  Component Value Date   LDLCALC 76 02/08/2019   Lab Results  Component Value Date   TRIG 54 02/08/2019   No results found for: [facility] Lab Results  Component Value Date   HGBA1C 5.4 01/25/2020      Assessment & Plan:  1. Encounter for examination following treatment at hospital; 3. Dizziness; 4.  Mild chronic anemia; 6.  Uterine fibroids Patient's notes from her recent emergency department visit reviewed and discussed with the patient at today's visit.  She does report that her dizziness is improved.  She does report a past history of vertigo.  Based on today's examination, I discussed with the patient that her dizziness is most likely related to her current  allergic rhinitis symptoms and that she should restart the use of her Allegra and Flonase to see if this helps to stop her current dizziness.  Additionally patient has history of uterine fibroids with secondary chronic anemia due to blood loss.  Her CBC at the emergency department however was consistent with normocytic anemia with hemoglobin of 11.3 therefore anemia is not likely to be contributing to her symptom of dizziness as her current anemia is mild.  She should continue her iron supplement however due to the chronicity of her anemia and she will continue to have monthly blood loss through her menses.  She did have an abnormal urinalysis during her emergency department visit for which further evaluation will also be done in case this may be contributing to her dizziness.  She is also encouraged to remain well-hydrated.  2. Leukocytes in urine; urinary frequency; vaginal discharge At her recent ED visit on 03/10/2020, patient with presence of small leukocytes in the urine and microscopic evaluation showed presence of bacteria.  Urine culture was not done therefore patient was asked to do repeat urinalysis and urine will also be sent for culture.  As she is having some lower abdominal discomfort and urinary frequency, she will also be placed on Bactrim DS twice daily x3 days which would treat an uncomplicated UTI/acute cystitis.  Glucose level at her recent emergency department visit was 87 therefore diabetes/hyperglycemia is unlikely to be contributing to her urinary frequency.  She also reports recent vaginal discharge/spotting and cervicovaginal ancillary testing will also be done.  She will be notified of the results and if any further treatment is needed based on the results. - POCT URINALYSIS DIP (CLINITEK) - Cervicovaginal ancillary only - Urine Culture - sulfamethoxazole-trimethoprim (BACTRIM DS) 800-160 MG tablet; Take 1 tablet by mouth 2 (two) times daily for 3 days.  Dispense: 6 tablet; Refill:  0   5. Chronic allergic rhinitis 7. Recurrent cough Discussed with the patient that her recurrent dizziness as well as her dry, recurrent cough are likely related to her allergic rhinitis.  On exam, patient with subacute fluid behind the TMs in addition to edema of the nasal turbinates and patient with posterior pharynx erythema and cobblestoning on exam of the oropharynx which are consistent with allergic rhinitis with possible eustachian tube dysfunction as well as postnasal drainage.  She has been asked to restart the use of her Allegra and Flonase.  Additionally, her cough could also be related to acid reflux and she has been asked to restart the use of Pepcid and pantoprazole.  If her symptoms do not improve within the next 2 weeks, she has been asked to make a follow-up appointment for further evaluation.    Follow-up: Return in about 2 weeks (around 04/03/2020) for if symptoms continue.   Antony Blackbird, MD

## 2020-03-21 ENCOUNTER — Telehealth: Payer: Self-pay | Admitting: Family Medicine

## 2020-03-21 ENCOUNTER — Other Ambulatory Visit: Payer: Self-pay | Admitting: Family Medicine

## 2020-03-21 DIAGNOSIS — B9689 Other specified bacterial agents as the cause of diseases classified elsewhere: Secondary | ICD-10-CM

## 2020-03-21 DIAGNOSIS — D509 Iron deficiency anemia, unspecified: Secondary | ICD-10-CM

## 2020-03-21 DIAGNOSIS — N760 Acute vaginitis: Secondary | ICD-10-CM

## 2020-03-21 DIAGNOSIS — A599 Trichomoniasis, unspecified: Secondary | ICD-10-CM

## 2020-03-21 LAB — CERVICOVAGINAL ANCILLARY ONLY
Bacterial Vaginitis (gardnerella): POSITIVE — AB
Candida Glabrata: NEGATIVE
Candida Vaginitis: NEGATIVE
Chlamydia: NEGATIVE
Comment: NEGATIVE
Comment: NEGATIVE
Comment: NEGATIVE
Comment: NEGATIVE
Comment: NEGATIVE
Comment: NORMAL
Neisseria Gonorrhea: NEGATIVE
Trichomonas: POSITIVE — AB

## 2020-03-21 MED ORDER — METRONIDAZOLE 500 MG PO TABS: 500.0000 mg | ORAL_TABLET | Freq: Two times a day (BID) | ORAL | 1 refills | Status: DC

## 2020-03-21 MED ORDER — IRON 325 (65 FE) MG PO TABS: ORAL_TABLET | ORAL | 2 refills | Status: DC

## 2020-03-21 NOTE — Telephone Encounter (Signed)
Contacted patient and informed her that her CAFA expired 10/18/2018. Also, informed patient that the [facility] does not cover any medical bills at our facility. Patient was advised to call the bill department to see what options she has.   Copied from [place] [phone]. Topic: General - Other >> Mar 07, 2020  1:25 PM Yvette Rack wrote: Reason for CRM: Pt would like to know why she was not informed that she would be charged $150 for a virtual visit that she had in August and September of 2020. Pt stated she did not have any insurance at the time but had an orange card that required her to pay $20 for her office visits. Pt stated she was never advised that the virtual visit would be any different. Pt requests that a manager call her back >> Mar 08, 2020  2:44 PM Arloa Koh wrote: Forwarded to Freight forwarder

## 2020-03-22 ENCOUNTER — Other Ambulatory Visit: Payer: Self-pay | Admitting: Family Medicine

## 2020-03-22 DIAGNOSIS — D509 Iron deficiency anemia, unspecified: Secondary | ICD-10-CM

## 2020-03-24 ENCOUNTER — Encounter: Payer: Self-pay | Admitting: Family Medicine

## 2020-04-03 ENCOUNTER — Ambulatory Visit: Payer: BC Managed Care – PPO | Admitting: Critical Care Medicine

## 2020-04-05 ENCOUNTER — Telehealth: Payer: Self-pay | Admitting: Family Medicine

## 2020-04-05 NOTE — Telephone Encounter (Signed)
Copied from [place] [phone]. Topic: General - Other >> Apr 05, 2020  2:03 PM Celene Kras wrote: Reason for CRM: Pt called stating that she received her flu shot on 04/04/20. Pt is requesting to have this added to her chart. Please advise.

## 2020-04-11 ENCOUNTER — Other Ambulatory Visit: Payer: Self-pay | Admitting: Critical Care Medicine

## 2020-04-11 ENCOUNTER — Encounter: Payer: Self-pay | Admitting: Critical Care Medicine

## 2020-04-11 ENCOUNTER — Ambulatory Visit: Payer: BC Managed Care – PPO | Admitting: Critical Care Medicine

## 2020-04-11 ENCOUNTER — Other Ambulatory Visit: Payer: Self-pay

## 2020-04-11 VITALS — BP 118/60 | HR 79 | Temp 97.4°F | Ht 63.0 in | Wt 146.2 lb

## 2020-04-11 DIAGNOSIS — J301 Allergic rhinitis due to pollen: Secondary | ICD-10-CM

## 2020-04-11 DIAGNOSIS — R053 Chronic cough: Secondary | ICD-10-CM | POA: Diagnosis not present

## 2020-04-11 MED ORDER — FEXOFENADINE HCL 180 MG PO TABS: 180.0000 mg | ORAL_TABLET | Freq: Every day | ORAL | 11 refills | Status: DC

## 2020-04-11 MED ORDER — TRIAMCINOLONE ACETONIDE 55 MCG/ACT NA AERO: 2.0000 | INHALATION_SPRAY | Freq: Every day | NASAL | 12 refills | Status: DC

## 2020-04-11 MED ORDER — PANTOPRAZOLE SODIUM 40 MG PO TBEC
40.0000 mg | DELAYED_RELEASE_TABLET | Freq: Every day | ORAL | 6 refills | Status: DC
Start: 2020-04-11 — End: 2020-08-21

## 2020-04-11 MED FILL — PANTOPRAZOLE SOD DR 40 MG T: 40 | 30 days supply | Qty: 30 | Fill #0

## 2020-04-11 NOTE — Patient Instructions (Addendum)
Thank you for visiting Dr. Carlis Abbott at [facility]. We recommend the following:   Meds ordered this encounter  Medications  . pantoprazole (PROTONIX) 40 MG tablet    Sig: Take 1 tablet (40 mg total) by mouth daily.    Dispense:  30 tablet    Refill:  6  . triamcinolone (NASACORT) 55 MCG/ACT AERO nasal inhaler    Sig: Place 2 sprays into the nose daily.    Dispense:  1 each    Refill:  12  . fexofenadine (ALLEGRA ALLERGY) 180 MG tablet    Sig: Take 1 tablet (180 mg total) by mouth daily.    Dispense:  30 tablet    Refill:  11    Return in about 3 months (around 07/12/2020). with Dr. Shearon Stalls (30 minute visit).    Please do your part to reduce the spread of COVID-19.

## 2020-04-11 NOTE — Progress Notes (Signed)
Synopsis: Referred in September 2019 for chronic cough by Antony Blackbird, MD. Previously a patient of Dr. Lake Bells.  Subjective:   PATIENT ID: [name] GENDER: female DOB: [birth year], MRN: [id number]  Chief Complaint  Patient presents with  . Follow-up    Patient needs refills and has started gaging again when her throat itches    Mrs. Bustamante is a 49 year old woman who presents for follow-up of chronic cough due to upper airway cough syndrome.  Since her last visit she has not been on any medications due to running out.  She has had difficulty affording over-the-counter Allegra.  She has been able to find intranasal steroids (Nasacort) and a discount store sometimes, and has been on this intermittently.  Pepcid has been on backorder, and she was not prescribed a PPI.  She has had one episode of posttussive vomiting and feels that she has had reflux symptoms.  Her cough has been uncontrolled, which has been especially problematic at work.  She has previously had a bad reaction to montelukast, which is on her allergy list.  She is up-to-date on her Covid vaccine and had a breakthrough Covid infection in the late summer.  She is up-to-date on her seasonal flu vaccine.  She is getting ready to go on a cruise to [place].    OV 03/01/20: Mrs. Ruffino is a 49 year old woman with a history of chronic cough due to upper airway cough syndrome.  She has a history of allergic rhinosinusitis for which she has been prescribed antihistamines, montelukast, and intranasal steroids.  She has also been prescribed multiple cough medications to relieve her symptoms.  She has GERD, for which she is prescribed an H2 blocker.  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  As of mid August, she reported that she was not taking her Pepcid, antihistamine, or montelukast and had to restart Tessalon to  relieve her cough.  She had stopped some of these due to no perceived benefit, some because her pharmacy she preferred to use was not open due to [place].  She is concerned about the black box warning associated with montelukast.  She underwent COVID testing twice at the [facility] on [address], and was found to be positive from a test performed on 02/15/2019.  Previously she had tested negative on 02/09/2019, but developed prominent GI symptoms and fevers starting on 8/23. She had a recent ED visit on 02/20/2019 for nausea, vomiting, fevers, and worsened cough from her baseline, and was prescribed doxycycline which she completed.    Today her symptoms are back to baseline.  She complains of ongoing cough, sometimes triggering gagging.  This is been an ongoing issue that waxes and wanes since at least 2017.  She has postnasal drip, but denies heartburn.  She does feel a sensation of reflux coming through her chest, but says that it does not burn.  She has not tried any medications to address the postnasal drip or reflux.  She takes Tessalon frequently, but does not feel that it controls her cough.  She does not have nocturnal coughing or sleep interruption.  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   Does not drink alcohol, but snacks in the evenings frequently. She reports that she has previously not been educated on a GERD diet.  Past Medical History:  Diagnosis Date  . Anemia   . Fibroid tumor      Family History  Problem Relation Age of Onset  . Hypertension Mother   . Hypertension Father   . Cancer Father        lungs  . Hypertension Sister   . Cancer Sister        pancreatitic  . Diabetes Maternal Aunt   . Diabetes Maternal Uncle      Past Surgical History:  Procedure Laterality Date  . BREAST EXCISIONAL BIOPSY Left    2003 benign  . FOOT SURGERY    . MYRINGOTOMY WITH TUBE PLACEMENT    . SKIN GRAFT    . WISDOM TOOTH  EXTRACTION      Social History   Socioeconomic History  . Marital status: Divorced    Spouse name: Not on file  . Number of children: 1  . Years of education: Not on file  . Highest education level: Associate degree: occupational, Hotel manager, or vocational program  Occupational History  . Not on file  Tobacco Use  . Smoking status: Never Smoker  . Smokeless tobacco: Never Used  Vaping Use  . Vaping Use: Never used  Substance and Sexual Activity  . Alcohol use: Yes    Comment: occasionally  . Drug use: No  . Sexual activity: Yes    Birth control/protection: None  Other Topics Concern  . Not on file  Social History Narrative  . Not on file   Social Determinants of Health   Financial Resource Strain:   . Difficulty of Paying Living Expenses: Not on file  Food Insecurity:   . Worried About Charity fundraiser in the Last Year: Not on file  . Ran Out of Food in the Last Year: Not on file  Transportation Needs: No Transportation Needs  . Lack of Transportation (Medical): No  . Lack of Transportation (Non-Medical): No  Physical Activity:   . Days of Exercise per Week: Not on file  . Minutes of Exercise per Session: Not on file  Stress:   . Feeling of Stress : Not on file  Social Connections:   . Frequency of Communication with Friends and Family: Not on file  . Frequency of Social Gatherings with Friends and Family: Not on file  . Attends Religious Services: Not on file  . Active Member of Clubs or Organizations: Not on file  . Attends Archivist Meetings: Not on file  . Marital Status: Not on file  Intimate Partner Violence:   . Fear of Current or Ex-Partner: Not on file  . Emotionally Abused: Not on file  . Physically Abused: Not on file  . Sexually Abused: Not on file     Allergies  Allergen Reactions  . Dilaudid [Hydromorphone Hcl] Nausea And Vomiting  . Meclizine Hcl Other (See Comments)    Caused confusion and sluggishness  . Singulair  [Montelukast Sodium]      Immunization History  Administered Date(s) Administered  . Influenza,inj,Quad PF,6+ Mos 03/14/2018, 08/03/2019, 04/04/2020  . Influenza,inj,Quad PF,6-35 Mos 08/03/2019  . PFIZER SARS-COV-2 Vaccination 09/07/2019, 10/02/2019  . Tdap 08/03/2019    Outpatient Medications Prior to Visit  Medication Sig Dispense Refill  . famotidine (PEPCID) 20 MG tablet Take 1 tablet (20 mg total) by mouth 2 (two) times daily. 30 tablet 11  . Ferrous Sulfate (IRON) 325 (65 Fe) MG TABS TAKE 1 TABLET BY MOUTH TWICE DAILY AFTER A MEAL 60 tablet 0  .  fexofenadine (ALLEGRA) 180 MG tablet Take 1 tablet (180 mg total) by mouth daily. 30 tablet 11  . fluticasone (FLONASE) 50 MCG/ACT nasal spray Place 2 sprays into both nostrils daily. 16 g 2  . metroNIDAZOLE (FLAGYL) 500 MG tablet Take 1 tablet (500 mg total) by mouth 2 (two) times daily. 14 tablet 1  . pantoprazole (PROTONIX) 40 MG tablet Take 1 tablet (40 mg total) by mouth daily. 30 tablet 5   No facility-administered medications prior to visit.    Review of Systems  Constitutional: Negative for chills and malaise/fatigue.       Fevers resolved  HENT: Negative for congestion.        Chronic throat irritation  Eyes: Negative.   Respiratory: Positive for cough.   Cardiovascular: Negative for chest pain and leg swelling.  Gastrointestinal: Positive for heartburn. Negative for nausea and vomiting.  Genitourinary: Negative.   Musculoskeletal: Negative for joint pain and myalgias.  Skin: Negative for rash.  Neurological: Negative for focal weakness and headaches.  Endo/Heme/Allergies: Does not bruise/bleed easily.  Psychiatric/Behavioral: Negative.      Objective:   Vitals:   04/11/20 0916  BP: 118/60  Pulse: 79  Temp: (!) 97.4 F (36.3 C)  TempSrc: Temporal  SpO2: 98%  Weight: 146 lb 3.2 oz (66.3 kg)  Height: 5\' 3"  (1.6 m)   98% on RA BMI Readings from Last 3 Encounters:  04/11/20 25.90 kg/m  03/20/20 26.22 kg/m    03/10/20 25.69 kg/m   Wt Readings from Last 3 Encounters:  04/11/20 146 lb 3.2 oz (66.3 kg)  03/20/20 148 lb (67.1 kg)  03/10/20 145 lb (65.8 kg)    Physical Exam Vitals reviewed.  Constitutional:      General: She is not in acute distress.    Appearance: Normal appearance. She is not ill-appearing.  HENT:     Head: Normocephalic and atraumatic.     Nose:     Comments: Deferred due to masking requirement.    Mouth/Throat:     Comments: Deferred due to masking requirement.  Eyes:     General: No scleral icterus. Cardiovascular:     Rate and Rhythm: Normal rate and regular rhythm.     Heart sounds: No murmur heard.   Pulmonary:     Comments: CTAB, speaking in full sentences. No coughing observed during visit, but frequent throat clearing. Musculoskeletal:        General: No deformity.     Cervical back: Neck supple.  Lymphadenopathy:     Cervical: No cervical adenopathy.  Skin:    General: Skin is warm and dry.  Neurological:     General: No focal deficit present.     Mental Status: She is alert.     Coordination: Coordination normal.  Psychiatric:        Mood and Affect: Mood normal.        Behavior: Behavior normal.      CBC    Component Value Date/Time   WBC 3.9 (L) 03/11/2020 0010   RBC 3.72 (L) 03/11/2020 0010   HGB 11.3 (L) 03/11/2020 0010   HGB 11.2 01/25/2020 1559   HCT 34.1 (L) 03/11/2020 0010   HCT 34.4 01/25/2020 1559   PLT 238 03/11/2020 0010   PLT 248 01/25/2020 1559   MCV 91.7 03/11/2020 0010   MCV 90 01/25/2020 1559   MCH 30.4 03/11/2020 0010   MCHC 33.1 03/11/2020 0010   RDW 13.0 03/11/2020 0010   RDW 12.8 01/25/2020 1559   LYMPHSABS  1.5 08/03/2019 1625   MONOABS 0.2 02/20/2019 1359   EOSABS 0.1 08/03/2019 1625   BASOSABS 0.0 08/03/2019 1625    Chest Imaging- films reviewed: CXR 02/20/2019- one-view portable chest x-ray, no significant opacities, effusions, or masses. CXR 03/15/1999 19-2 view chest x-ray demonstrating increased  vascularity in the bases, but no opacities or masses.  No effusions. CT abdomen/pelvis 05/30/2016- lower lung cuts reviewed-no effusions, pleural thickening, opacities, or other abnormalities.  Pulmonary Functions Testing Results: PFT Results Latest Ref Rng & Units 04/11/2018  FVC-Pre L 2.78  FVC-Predicted Pre % 95  FVC-Post L 2.88  FVC-Predicted Post % 98  Pre FEV1/FVC % % 81  Post FEV1/FCV % % 85  FEV1-Pre L 2.25  FEV1-Predicted Pre % 94  FEV1-Post L 2.45  DLCO uncorrected ml/min/mmHg 16.86  DLCO UNC% % 71  DLVA Predicted % 84  TLC L 4.36  TLC % Predicted % 87  RV % Predicted % 76  No obstruction or restriction. Mildly reduced DLCO, not corrected for hemoglobin.     Assessment & Plan:     ICD-10-CM   1. Chronic cough  R05.3   2. Allergic rhinitis due to pollen, unspecified seasonality  J30.1      Chronic cough due to upper airway cough syndrome -con't intranasal steroids- nasacort or flonase is fine. -Recommend restarting Allegra once daily.  Previously she has had fatigue with Claritin or Zyrtec, although these would both help with nasal dryness if she could afford 1 of these instead. -Unfortunately unable to use montelukast due to previous intolerance.  Allergic rhinosinusitis causing post-nasal drip -encouraged medication compliance-oral antihistamine and intranasal steroids  GERD- has reflux sensation without heartburn -Have previously discussed GERD lifestyle and dietary modifications, including elevating the head of the bead and avoiding eating within 3-4 hours of going to sleep -Start Pantoprazole 40 mg daily.  Would like to switch to Pepcid once no longer on backorder given the long-term risk of CKD and osteoporosis with chronic use.  RTC 3 months with Dr. Shearon Stalls.    Current Outpatient Medications:  .  famotidine (PEPCID) 20 MG tablet, Take 1 tablet (20 mg total) by mouth 2 (two) times daily., Disp: 30 tablet, Rfl: 11 .  Ferrous Sulfate (IRON) 325 (65 Fe) MG  TABS, TAKE 1 TABLET BY MOUTH TWICE DAILY AFTER A MEAL, Disp: 60 tablet, Rfl: 0 .  fexofenadine (ALLEGRA) 180 MG tablet, Take 1 tablet (180 mg total) by mouth daily., Disp: 30 tablet, Rfl: 11 .  fluticasone (FLONASE) 50 MCG/ACT nasal spray, Place 2 sprays into both nostrils daily., Disp: 16 g, Rfl: 2 .  metroNIDAZOLE (FLAGYL) 500 MG tablet, Take 1 tablet (500 mg total) by mouth 2 (two) times daily., Disp: 14 tablet, Rfl: 1 .  pantoprazole (PROTONIX) 40 MG tablet, Take 1 tablet (40 mg total) by mouth daily., Disp: 30 tablet, Rfl: 5   Julian Hy, DO [facility] 04/11/2020 9:22 AM

## 2020-05-08 ENCOUNTER — Other Ambulatory Visit: Payer: Self-pay | Admitting: Family Medicine

## 2020-05-08 ENCOUNTER — Encounter: Payer: Self-pay | Admitting: Family Medicine

## 2020-05-08 ENCOUNTER — Ambulatory Visit: Payer: BC Managed Care – PPO | Attending: Family Medicine | Admitting: Family Medicine

## 2020-05-08 ENCOUNTER — Other Ambulatory Visit (HOSPITAL_COMMUNITY)
Admission: RE | Admit: 2020-05-08 | Discharge: 2020-05-08 | Disposition: A | Discharge status: Home / Self Care | Payer: BC Managed Care – PPO | Source: Ambulatory Visit | Attending: Family Medicine | Admitting: Family Medicine

## 2020-05-08 ENCOUNTER — Other Ambulatory Visit: Payer: Self-pay

## 2020-05-08 VITALS — BP 123/84 | HR 84 | Wt 146.4 lb

## 2020-05-08 DIAGNOSIS — N920 Excessive and frequent menstruation with regular cycle: Secondary | ICD-10-CM

## 2020-05-08 DIAGNOSIS — J0140 Acute pansinusitis, unspecified: Secondary | ICD-10-CM

## 2020-05-08 DIAGNOSIS — R350 Frequency of micturition: Secondary | ICD-10-CM

## 2020-05-08 DIAGNOSIS — N923 Ovulation bleeding: Secondary | ICD-10-CM

## 2020-05-08 DIAGNOSIS — R102 Pelvic and perineal pain: Secondary | ICD-10-CM

## 2020-05-08 DIAGNOSIS — N760 Acute vaginitis: Secondary | ICD-10-CM | POA: Insufficient documentation

## 2020-05-08 DIAGNOSIS — A5901 Trichomonal vulvovaginitis: Secondary | ICD-10-CM | POA: Diagnosis not present

## 2020-05-08 DIAGNOSIS — D649 Anemia, unspecified: Secondary | ICD-10-CM

## 2020-05-08 DIAGNOSIS — B373 Candidiasis of vulva and vagina: Secondary | ICD-10-CM | POA: Insufficient documentation

## 2020-05-08 DIAGNOSIS — R42 Dizziness and giddiness: Secondary | ICD-10-CM

## 2020-05-08 DIAGNOSIS — B9689 Other specified bacterial agents as the cause of diseases classified elsewhere: Secondary | ICD-10-CM | POA: Insufficient documentation

## 2020-05-08 DIAGNOSIS — J309 Allergic rhinitis, unspecified: Secondary | ICD-10-CM

## 2020-05-08 DIAGNOSIS — R059 Cough, unspecified: Secondary | ICD-10-CM

## 2020-05-08 DIAGNOSIS — R1024 Suprapubic pain: Secondary | ICD-10-CM

## 2020-05-08 LAB — POCT URINALYSIS DIP (CLINITEK)
Bilirubin, UA: NEGATIVE
Glucose, UA: NEGATIVE mg/dL
Ketones, POC UA: NEGATIVE mg/dL
Nitrite, UA: NEGATIVE
POC PROTEIN,UA: NEGATIVE
Spec Grav, UA: 1.025
Urobilinogen, UA: 1 U/dL
pH, UA: 7.5

## 2020-05-08 MED ORDER — AMOXICILLIN-POT CLAVULANATE 500-125 MG PO TABS: 1.0000 | ORAL_TABLET | Freq: Two times a day (BID) | ORAL | 0 refills | Status: DC

## 2020-05-08 MED ORDER — CETIRIZINE HCL 10 MG PO TABS: 10.0000 mg | ORAL_TABLET | Freq: Every day | ORAL | 11 refills | Status: DC

## 2020-05-08 MED FILL — CETIRIZINE HCL 10 MG TABS: 10 | 30 days supply | Qty: 30 | Fill #0

## 2020-05-08 MED FILL — AMOX-CLAV 500-125 MG TABLET: 500-125 | 10 days supply | Qty: 20 | Fill #0

## 2020-05-08 NOTE — Progress Notes (Signed)
Dizziness Cough has worsened Sexual intercourse on 11/5 has been spotting and cramping since cycle not due till around 11/26

## 2020-05-08 NOTE — Progress Notes (Signed)
Established Patient Office Visit  Subjective:  Patient ID: Gail Moreno, female    DOB: [birth year]  Age: 49 y.o. MRN: [id number]  CC:  Chief Complaint  Patient presents with  . Follow-up    HPI Gail Moreno, 49 year old female, who reports that her next menses is not due until 05/17/2020 but she has started having spotting in between her menses.  She was sexually active on 04/26/2020.  She denies any current vaginal or pelvic pain.  She does have some mid lower abdominal pain and has had some increased frequency of urination.  She reports that her dizziness is greatly improved and in fact she has only had brief episode when she recently took a vacation on a cruise ship and had very brief dizziness as the boat was leaving the dock but has not had any additional dizziness.  She had been taking Benadryl to help with postnasal drainage but found that this made her very tired when she took doses during the day.  She has noticed an increase of cough which is generally been nonproductive.  Past Medical History:  Diagnosis Date  . Anemia   . Fibroid tumor     Past Surgical History:  Procedure Laterality Date  . BREAST EXCISIONAL BIOPSY Left    2003 benign  . FOOT SURGERY    . MYRINGOTOMY WITH TUBE PLACEMENT    . SKIN GRAFT    . WISDOM TOOTH EXTRACTION      Family History  Problem Relation Age of Onset  . Hypertension Mother   . Hypertension Father   . Cancer Father        lungs  . Hypertension Sister   . Cancer Sister        pancreatitic  . Diabetes Maternal Aunt   . Diabetes Maternal Uncle     Social History   Socioeconomic History  . Marital status: Divorced    Spouse name: Not on file  . Number of children: 1  . Years of education: Not on file  . Highest education level: Associate degree: occupational, Hotel manager, or vocational program  Occupational History  . Not on file  Tobacco Use  . Smoking status: Never Smoker  . Smokeless tobacco: Never Used  Vaping Use  .  Vaping Use: Never used  Substance and Sexual Activity  . Alcohol use: Yes    Comment: occasionally  . Drug use: No  . Sexual activity: Yes    Birth control/protection: None  Other Topics Concern  . Not on file  Social History Narrative  . Not on file   Social Determinants of Health   Financial Resource Strain:   . Difficulty of Paying Living Expenses: Not on file  Food Insecurity:   . Worried About Charity fundraiser in the Last Year: Not on file  . Ran Out of Food in the Last Year: Not on file  Transportation Needs: No Transportation Needs  . Lack of Transportation (Medical): No  . Lack of Transportation (Non-Medical): No  Physical Activity:   . Days of Exercise per Week: Not on file  . Minutes of Exercise per Session: Not on file  Stress:   . Feeling of Stress : Not on file  Social Connections:   . Frequency of Communication with Friends and Family: Not on file  . Frequency of Social Gatherings with Friends and Family: Not on file  . Attends Religious Services: Not on file  . Active Member of Clubs or Organizations: Not on file  .  Attends Archivist Meetings: Not on file  . Marital Status: Not on file  Intimate Partner Violence:   . Fear of Current or Ex-Partner: Not on file  . Emotionally Abused: Not on file  . Physically Abused: Not on file  . Sexually Abused: Not on file    Outpatient Medications Prior to Visit  Medication Sig Dispense Refill  . famotidine (PEPCID) 20 MG tablet Take 1 tablet (20 mg total) by mouth 2 (two) times daily. 30 tablet 11  . Ferrous Sulfate (IRON) 325 (65 Fe) MG TABS TAKE 1 TABLET BY MOUTH TWICE DAILY AFTER A MEAL 60 tablet 0  . pantoprazole (PROTONIX) 40 MG tablet Take 1 tablet (40 mg total) by mouth daily. 30 tablet 6  . fexofenadine (ALLEGRA ALLERGY) 180 MG tablet Take 1 tablet (180 mg total) by mouth daily. (Patient not taking: Reported on 05/08/2020) 30 tablet 11  . fexofenadine (ALLEGRA) 180 MG tablet Take 1 tablet (180 mg  total) by mouth daily. (Patient not taking: Reported on 05/08/2020) 30 tablet 11  . metroNIDAZOLE (FLAGYL) 500 MG tablet Take 1 tablet (500 mg total) by mouth 2 (two) times daily. (Patient not taking: Reported on 05/08/2020) 14 tablet 1  . triamcinolone (NASACORT) 55 MCG/ACT AERO nasal inhaler Place 2 sprays into the nose daily. (Patient not taking: Reported on 05/08/2020) 1 each 12   No facility-administered medications prior to visit.    Allergies  Allergen Reactions  . Dilaudid [Hydromorphone Hcl] Nausea And Vomiting  . Meclizine Hcl Other (See Comments)    Caused confusion and sluggishness  . Singulair [Montelukast Sodium]     ROS Review of Systems  Constitutional: Positive for fatigue. Negative for chills and fever.  HENT: Positive for congestion, ear discharge (sensation of wax draining from her ears which has happened for years), postnasal drip and rhinorrhea. Negative for ear pain, sore throat and trouble swallowing.   Eyes: Negative for photophobia and visual disturbance.  Respiratory: Positive for cough (patient with complaint of cough with occasional yellow sputum) and wheezing. Negative for shortness of breath.   Cardiovascular: Negative for chest pain and palpitations.  Gastrointestinal: Positive for abdominal pain. Negative for constipation, diarrhea and nausea.  Endocrine: Negative for polydipsia, polyphagia and polyuria.  Genitourinary: Positive for menstrual problem and vaginal bleeding (spotting between menses). Negative for dyspareunia, dysuria, flank pain, pelvic pain and vaginal pain.  Musculoskeletal: Negative for arthralgias and back pain.  Skin: Negative for rash and wound.  Neurological: Positive for dizziness (improved). Negative for headaches (occasional mild frontal pressure).  Hematological: Negative for adenopathy. Does not bruise/bleed easily.  Psychiatric/Behavioral: Negative for suicidal ideas. The patient is not nervous/anxious.       Objective:      Physical Exam Vitals and nursing note reviewed.  Constitutional:      General: She is not in acute distress.    Appearance: Normal appearance.  HENT:     Right Ear: Ear canal and external ear normal.     Left Ear: Ear canal and external ear normal. There is no impacted cerumen.     Ears:     Comments: Subacute fluid levels behind each TM and TM's are dull bilaterally    Nose: Congestion and rhinorrhea present.     Comments: Edematous nasal turbinates with clear, white and yellow nasal drainage    Mouth/Throat:     Pharynx: Posterior oropharyngeal erythema present. No oropharyngeal exudate.     Comments: Posterior pharynx erythema with cobblestoning Eyes:  Extraocular Movements: Extraocular movements intact.     Conjunctiva/sclera: Conjunctivae normal.  Cardiovascular:     Rate and Rhythm: Normal rate and regular rhythm.  Pulmonary:     Effort: Pulmonary effort is normal.     Breath sounds: Normal breath sounds.  Abdominal:     Palpations: Abdomen is soft.     Tenderness: There is abdominal tenderness (suprapubic tenderness to palp). There is no right CVA tenderness, left CVA tenderness, guarding or rebound.  Musculoskeletal:        General: No tenderness.     Cervical back: Normal range of motion and neck supple. No tenderness.     Right lower leg: No edema.     Left lower leg: No edema.  Lymphadenopathy:     Cervical: No cervical adenopathy.  Skin:    General: Skin is warm and dry.  Neurological:     General: No focal deficit present.     Mental Status: She is alert and oriented to person, place, and time.  Psychiatric:        Mood and Affect: Mood normal.        Behavior: Behavior normal.     BP 123/84 (BP Location: Left Arm, Patient Position: Sitting)   Pulse 84   Wt 146 lb 6.4 oz (66.4 kg)   SpO2 100%   BMI 25.93 kg/m  Wt Readings from Last 3 Encounters:  05/08/20 146 lb 6.4 oz (66.4 kg)  04/11/20 146 lb 3.2 oz (66.3 kg)  03/20/20 148 lb (67.1 kg)      Health Maintenance Due  Topic Date Due  . Hepatitis C Screening  Never done     Lab Results  Component Value Date   TSH 1.96 03/20/2016   Lab Results  Component Value Date   WBC 3.9 (L) 03/11/2020   HGB 11.3 (L) 03/11/2020   HCT 34.1 (L) 03/11/2020   MCV 91.7 03/11/2020   PLT 238 03/11/2020   Lab Results  Component Value Date   NA 138 03/11/2020   K 4.4 03/11/2020   CO2 22 03/11/2020   GLUCOSE 87 03/11/2020   BUN 15 03/11/2020   CREATININE 0.77 03/11/2020   BILITOT 0.3 02/20/2019   ALKPHOS 62 02/20/2019   AST 25 02/20/2019   ALT 19 02/20/2019   PROT 7.3 02/20/2019   ALBUMIN 3.8 02/20/2019   CALCIUM 9.1 03/11/2020   ANIONGAP 9 03/11/2020   Lab Results  Component Value Date   CHOL 135 02/08/2019   Lab Results  Component Value Date   HDL 48 02/08/2019   Lab Results  Component Value Date   LDLCALC 76 02/08/2019   Lab Results  Component Value Date   TRIG 54 02/08/2019   No results found for: [facility] Lab Results  Component Value Date   HGBA1C 5.4 01/25/2020      Assessment & Plan:  1. Urinary frequency Patient with complaint of urinary frequency and suprapubic discomfort and she will have urinalysis and urine culture today's visit to look for possible urinary tract infection. - POCT URINALYSIS DIP (CLINITEK) - Urine Culture  2. Spotting between menses Patient with complaint of spotting between menses and will do cervicovaginal ancillary testing today to look for infections which could cause spotting between menses.  She will be notified of the results and if further treatment is needed based on the results. - Cervicovaginal ancillary only  3. Normocytic anemia Patient has had CBC done 03/20/2020 with hemoglobin of 11.3.  4. Acute pansinusitis, recurrence not specified Patient  with evidence of acute pansinusitis and postnasal drainage is likely contributing to her issues with increased cough.  She has been placed on Augmentin 500 mg twice daily  x10 days for treatment of sinusitis and prescription provided for generic Zyrtec to take at bedtime to help with postnasal drainage. - amoxicillin-clavulanate (AUGMENTIN) 500-125 MG tablet; Take 1 tablet (500 mg total) by mouth 2 (two) times daily for 10 days. Take after eating  Dispense: 20 tablet; Refill: 0  5. Chronic allergic rhinitis Prescription provided for generic Zyrtec to take at bedtime to help with postnasal drainage/allergic rhinitis.  6. Suprapubic discomfort Patient with suprapubic discomfort and complaint of urinary frequency.  Urine will be sent for culture to see if patient might have a urinary tract infection. - Urine Culture  7. Dizziness She has reported an improvement in her level of dizziness.  She was encouraged to take an over-the-counter antihistamine as she does have evidence of subacute fluid behind the TMs as well as edema of the nasal turbinates. Given Rx for zyrtec to take each evening to help with allergic rhinitis but considered switching to loratadine 10 mg daily if she she has issues with feeling too tired.   8. Cough Discussed with patient that her cough is likely related to postnasal drainage from her allergic rhinitis and current sinusitis.  She has been placed on Augmentin and generic Zyrtec.  She has been asked to follow-up in the next 2 weeks to make sure that her cough and congestion have improved.   Follow-up: Return in about 2 weeks (around 05/22/2020) for cough/congestion-return in 2 weeks if not better.   Antony Blackbird, MD

## 2020-05-09 ENCOUNTER — Other Ambulatory Visit: Payer: Self-pay | Admitting: Family Medicine

## 2020-05-09 DIAGNOSIS — B373 Candidiasis of vulva and vagina: Secondary | ICD-10-CM

## 2020-05-09 DIAGNOSIS — B9689 Other specified bacterial agents as the cause of diseases classified elsewhere: Secondary | ICD-10-CM

## 2020-05-09 DIAGNOSIS — B3731 Acute candidiasis of vulva and vagina: Secondary | ICD-10-CM

## 2020-05-09 DIAGNOSIS — N760 Acute vaginitis: Secondary | ICD-10-CM

## 2020-05-09 DIAGNOSIS — A599 Trichomoniasis, unspecified: Secondary | ICD-10-CM

## 2020-05-09 LAB — CERVICOVAGINAL ANCILLARY ONLY
Bacterial Vaginitis (gardnerella): POSITIVE — AB
Candida Glabrata: NEGATIVE
Candida Vaginitis: POSITIVE — AB
Chlamydia: NEGATIVE
Comment: NEGATIVE
Comment: NEGATIVE
Comment: NEGATIVE
Comment: NEGATIVE
Comment: NEGATIVE
Comment: NORMAL
Neisseria Gonorrhea: NEGATIVE
Trichomonas: POSITIVE — AB

## 2020-05-09 MED ORDER — METRONIDAZOLE 500 MG PO TABS: 500.0000 mg | ORAL_TABLET | Freq: Two times a day (BID) | ORAL | 0 refills | Status: DC

## 2020-05-09 MED ORDER — FLUCONAZOLE 150 MG PO TABS: 150.0000 mg | ORAL_TABLET | Freq: Once | ORAL | 0 refills | Status: AC

## 2020-05-10 ENCOUNTER — Other Ambulatory Visit: Payer: Self-pay

## 2020-05-10 DIAGNOSIS — B9689 Other specified bacterial agents as the cause of diseases classified elsewhere: Secondary | ICD-10-CM

## 2020-05-10 DIAGNOSIS — A599 Trichomoniasis, unspecified: Secondary | ICD-10-CM

## 2020-05-10 DIAGNOSIS — N760 Acute vaginitis: Secondary | ICD-10-CM

## 2020-05-10 LAB — URINE CULTURE

## 2020-05-10 MED ORDER — METRONIDAZOLE 500 MG PO TABS: 500.0000 mg | ORAL_TABLET | Freq: Two times a day (BID) | ORAL | 0 refills | Status: DC

## 2020-05-10 MED FILL — metroNIDAZOLE 500 MG TABS: 500 | 7 days supply | Qty: 14 | Fill #0

## 2020-05-10 NOTE — Progress Notes (Signed)
rx sent to incorrect pharmacy

## 2020-05-13 ENCOUNTER — Telehealth: Payer: Self-pay | Admitting: *Deleted

## 2020-05-13 NOTE — Telephone Encounter (Signed)
Copied from [place] [phone]. Topic: General - Other >> May 10, 2020  9:03 AM Alanda Slim E wrote: Reason for CRM: Pt received her results from her swab and wanted to speak with a nurse about the next step / please advise   Patient states she spoke to [facility], [place] already regarding results.

## 2020-05-15 MED FILL — PANTOPRAZOLE SOD DR 40 MG T: 40 | 30 days supply | Qty: 30 | Fill #1

## 2020-05-22 ENCOUNTER — Ambulatory Visit: Payer: Self-pay

## 2020-05-22 NOTE — Telephone Encounter (Signed)
Pt. Reports she has had sinus issues and today has had "bad vertigo while I was driving to work." "This happens when I have sinus problems." Warm transfer to [place] in the practice for an appointment.  Answer Assessment - Initial Assessment Questions 1. DESCRIPTION: "Describe your dizziness."     Dizzy 2. VERTIGO: "Do you feel like either you or the room is spinning or tilting?"      Yes 3. LIGHTHEADED: "Do you feel lightheaded?" (e.g., somewhat faint, woozy, weak upon standing)     No 4. SEVERITY: "How bad is it?"  "Can you walk?"   - MILD: Feels unsteady but walking normally.   - MODERATE: Feels very unsteady when walking, but not falling; interferes with normal activities (e.g., school, work) .   - SEVERE: Unable to walk without falling, or requires assistance to walk without falling.     Mild 5. ONSET:  "When did the dizziness begin?"     Last night 6. AGGRAVATING FACTORS: "Does anything make it worse?" (e.g., standing, change in head position)     No 7. CAUSE: "What do you think is causing the dizziness?"     Unsure 8. RECURRENT SYMPTOM: "Have you had dizziness before?" If Yes, ask: "When was the last time?" "What happened that time?"     Yes 9. OTHER SYMPTOMS: "Do you have any other symptoms?" (e.g., headache, weakness, numbness, vomiting, earache)     No 10. PREGNANCY: "Is there any chance you are pregnant?" "When was your last menstrual period?"       No  Protocols used: DIZZINESS - VERTIGO-A-AH

## 2020-05-23 ENCOUNTER — Encounter: Payer: Self-pay | Admitting: Physician Assistant

## 2020-05-23 ENCOUNTER — Other Ambulatory Visit: Payer: Self-pay

## 2020-05-23 ENCOUNTER — Ambulatory Visit: Payer: BC Managed Care – PPO | Attending: Physician Assistant | Admitting: Physician Assistant

## 2020-05-23 VITALS — BP 99/67 | HR 76 | Temp 98.7°F | Resp 18 | Ht 63.0 in | Wt 145.0 lb

## 2020-05-23 DIAGNOSIS — D508 Other iron deficiency anemias: Secondary | ICD-10-CM | POA: Diagnosis not present

## 2020-05-23 DIAGNOSIS — E559 Vitamin D deficiency, unspecified: Secondary | ICD-10-CM

## 2020-05-23 DIAGNOSIS — J329 Chronic sinusitis, unspecified: Secondary | ICD-10-CM | POA: Diagnosis not present

## 2020-05-23 DIAGNOSIS — R42 Dizziness and giddiness: Secondary | ICD-10-CM | POA: Diagnosis not present

## 2020-05-23 NOTE — Progress Notes (Signed)
Established Patient Office Visit  Subjective:  Patient ID: Gail Moreno, female    DOB: June 10, 1971  Age: 49 y.o. MRN: [id number]  CC:  Chief Complaint  Patient presents with  . Dizziness    HPI Gail Moreno  reports that she started having episodes of dizziness at the beginning November while on a cruise. Reports she was then seen by her primary care provider and was treated for recurrent sinusitis, drainage, and cough. Reports that she did complete her treatment, is continuing to take Zyrtec on a daily basis.  Reports that she is treated for sinusitis 1-2 times a year "at least"    Reports 2 days ago she had a severe episode of dizziness after eating her dinner, and then had two more yesterday. Describes dizziness as room is spinning, feeling of unsteady. Reports that she checked her blood pressure with a reading of 99/64. Reports dizziness went away on its own  Reports that she drinks approximately one bottle of water a day     Past Medical History:  Diagnosis Date  . Anemia   . Fibroid tumor     Past Surgical History:  Procedure Laterality Date  . BREAST EXCISIONAL BIOPSY Left    2003 benign  . FOOT SURGERY    . MYRINGOTOMY WITH TUBE PLACEMENT    . SKIN GRAFT    . WISDOM TOOTH EXTRACTION      Family History  Problem Relation Age of Onset  . Hypertension Mother   . Hypertension Father   . Cancer Father        lungs  . Hypertension Sister   . Cancer Sister        pancreatitic  . Diabetes Maternal Aunt   . Diabetes Maternal Uncle     Social History   Socioeconomic History  . Marital status: Divorced    Spouse name: Not on file  . Number of children: 1  . Years of education: Not on file  . Highest education level: Associate degree: occupational, Hotel manager, or vocational program  Occupational History  . Not on file  Tobacco Use  . Smoking status: Never Smoker  . Smokeless tobacco: Never Used  Vaping Use  . Vaping Use: Never used  Substance and  Sexual Activity  . Alcohol use: Yes    Comment: occasionally  . Drug use: No  . Sexual activity: Yes    Birth control/protection: None  Other Topics Concern  . Not on file  Social History Narrative  . Not on file   Social Determinants of Health   Financial Resource Strain:   . Difficulty of Paying Living Expenses: Not on file  Food Insecurity:   . Worried About Charity fundraiser in the Last Year: Not on file  . Ran Out of Food in the Last Year: Not on file  Transportation Needs: No Transportation Needs  . Lack of Transportation (Medical): No  . Lack of Transportation (Non-Medical): No  Physical Activity:   . Days of Exercise per Week: Not on file  . Minutes of Exercise per Session: Not on file  Stress:   . Feeling of Stress : Not on file  Social Connections:   . Frequency of Communication with Friends and Family: Not on file  . Frequency of Social Gatherings with Friends and Family: Not on file  . Attends Religious Services: Not on file  . Active Member of Clubs or Organizations: Not on file  . Attends Archivist Meetings: Not on file  .  Marital Status: Not on file  Intimate Partner Violence:   . Fear of Current or Ex-Partner: Not on file  . Emotionally Abused: Not on file  . Physically Abused: Not on file  . Sexually Abused: Not on file    Outpatient Medications Prior to Visit  Medication Sig Dispense Refill  . cetirizine (ZYRTEC) 10 MG tablet Take 1 tablet (10 mg total) by mouth daily. In the evening to help with nasal congestion 30 tablet 11  . Ferrous Sulfate (IRON) 325 (65 Fe) MG TABS TAKE 1 TABLET BY MOUTH TWICE DAILY AFTER A MEAL 60 tablet 0  . pantoprazole (PROTONIX) 40 MG tablet Take 1 tablet (40 mg total) by mouth daily. 30 tablet 6  . famotidine (PEPCID) 20 MG tablet Take 1 tablet (20 mg total) by mouth 2 (two) times daily. 30 tablet 11  . triamcinolone (NASACORT) 55 MCG/ACT AERO nasal inhaler Place 2 sprays into the nose daily. (Patient not  taking: Reported on 05/08/2020) 1 each 12  . metroNIDAZOLE (FLAGYL) 500 MG tablet Take 1 tablet (500 mg total) by mouth 2 (two) times daily. 14 tablet 0   No facility-administered medications prior to visit.    Allergies  Allergen Reactions  . Dilaudid [Hydromorphone Hcl] Nausea And Vomiting  . Meclizine Hcl Other (See Comments)    Caused confusion and sluggishness  . Singulair [Montelukast Sodium]     ROS Review of Systems  HENT: Negative.  Negative for ear pain, sinus pressure and sinus pain.   Eyes: Negative.  Negative for visual disturbance.  Respiratory: Negative.   Cardiovascular: Negative for chest pain.  Gastrointestinal: Negative for diarrhea, nausea and vomiting.  Endocrine: Negative.   Genitourinary: Negative.   Musculoskeletal: Negative.   Skin: Negative.   Allergic/Immunologic: Negative.   Neurological: Positive for dizziness. Negative for syncope.  Hematological: Negative.   Psychiatric/Behavioral: Negative.       Objective:    Physical Exam Vitals and nursing note reviewed.   BP 99/67 (BP Location: Left Arm, Patient Position: Sitting, Cuff Size: Normal)   Pulse 76   Temp 98.7 F (37.1 C) (Oral)   Resp 18   Ht 5\' 3"  (1.6 m)   Wt 145 lb (65.8 kg)   LMP 05/21/2020   SpO2 100%   BMI 25.69 kg/m   General Appearance:    Alert, cooperative, no distress, appears stated age  Head:    Normocephalic, without obvious abnormality, atraumatic  Eyes:    PERRL, conjunctiva/corneas clear, EOM's intact, fundi    benign, both eyes  Ears:    Normal TM's and external ear canals, both ears  Nose:   Nares normal, septum midline, mucosa normal, no drainage    or sinus tenderness  Throat:   Lips, mucosa, and tongue normal; teeth and gums normal  Neck:   Supple, symmetrical, trachea midline, no adenopathy;    thyroid:  no enlargement/tenderness/nodules; no carotid   bruit or JVD  Back:     Symmetric, no curvature, ROM normal, no CVA tenderness  Lungs:     Clear to  auscultation bilaterally, respirations unlabored  Chest Wall:    No tenderness or deformity   Heart:    Regular rate and rhythm, S1 and S2 normal, no murmur, rub   or gallop     Abdomen:     Soft, non-tender, bowel sounds active all four quadrants,    no masses, no organomegaly        Extremities:   Extremities normal, atraumatic, no cyanosis or edema  Pulses:   2+ and symmetric all extremities  Skin:   Skin color, texture, turgor normal, no rashes or lesions  Lymph nodes:   Cervical, supraclavicular, and axillary nodes normal  Neurologic:   CNII-XII intact, normal strength, sensation and reflexes    throughout    BP 99/67 (BP Location: Left Arm, Patient Position: Sitting, Cuff Size: Normal)   Pulse 76   Temp 98.7 F (37.1 C) (Oral)   Resp 18   Ht 5\' 3"  (1.6 m)   Wt 145 lb (65.8 kg)   LMP 05/21/2020   SpO2 100%   BMI 25.69 kg/m  Wt Readings from Last 3 Encounters:  05/23/20 145 lb (65.8 kg)  05/08/20 146 lb 6.4 oz (66.4 kg)  04/11/20 146 lb 3.2 oz (66.3 kg)     Health Maintenance Due  Topic Date Due  . Hepatitis C Screening  Never done    There are no preventive care reminders to display for this patient.  Lab Results  Component Value Date   TSH 2.380 05/23/2020   Lab Results  Component Value Date   WBC 4.0 05/23/2020   HGB 11.5 05/23/2020   HCT 34.2 05/23/2020   MCV 90 05/23/2020   PLT 237 05/23/2020   Lab Results  Component Value Date   NA 140 05/23/2020   K 4.5 05/23/2020   CO2 22 03/11/2020   GLUCOSE 87 05/23/2020   BUN 16 05/23/2020   CREATININE 0.87 05/23/2020   BILITOT 0.5 05/23/2020   ALKPHOS 76 05/23/2020   AST 19 05/23/2020   ALT 19 02/20/2019   PROT 6.9 05/23/2020   ALBUMIN 4.1 05/23/2020   CALCIUM 9.2 05/23/2020   ANIONGAP 9 03/11/2020   Lab Results  Component Value Date   CHOL 135 02/08/2019   Lab Results  Component Value Date   HDL 48 02/08/2019   Lab Results  Component Value Date   LDLCALC 76 02/08/2019   Lab Results    Component Value Date   TRIG 54 02/08/2019   No results found for: CHOLHDL Lab Results  Component Value Date   HGBA1C 5.4 01/25/2020      Assessment & Plan:   Problem List Items Addressed This Visit      Other   Iron deficiency anemia   Relevant Orders   Iron, TIBC and Ferritin Panel (Completed)    Other Visit Diagnoses    Dizziness    -  Primary   Relevant Orders   CBC with Differential/Platelet (Completed)   Comp. Metabolic Panel (12) (Completed)   Vitamin D, 25-hydroxy (Completed)   Ambulatory referral to ENT   TSH (Completed)   Recurrent sinus infections       Relevant Orders   Ambulatory referral to ENT   Vitamin D deficiency       Relevant Medications   Vitamin D, Ergocalciferol, (DRISDOL) 1.25 MG (50000 UNIT) CAPS capsule    1. Dizziness Encourage patient to increase hydration, keep diary of dizziness episodes, refer to ENT  - CBC with Differential/Platelet - Comp. Metabolic Panel (12) - Vitamin D, 25-hydroxy - Ambulatory referral to ENT - TSH  2. Recurrent sinus infections  - Ambulatory referral to ENT  3. Other iron deficiency anemia  - Iron, TIBC and Ferritin Panel   I have reviewed the patient's medical history (PMH, PSH, Social History, Family History, Medications, and allergies) , and have been updated if relevant. I spent 30 minutes reviewing chart and  face to face time with patient.  Meds ordered this encounter  Medications  . Vitamin D, Ergocalciferol, (DRISDOL) 1.25 MG (50000 UNIT) CAPS capsule    Sig: Take 1 capsule (50,000 Units total) by mouth every 7 (seven) days.    Dispense:  4 capsule    Refill:  2    Order Specific Question:   Supervising Provider    Answer:   Elsie Stain [[id number]]    Follow-up: Return if symptoms worsen or fail to improve.    Loraine Grip Mayers, PA-C

## 2020-05-23 NOTE — Patient Instructions (Signed)
I encourage you to increase your hydration, drink at least 64 ounces of water a day.  We will call you with your lab results.  I hope that you feel better soon  Kennieth Rad, PA-C Physician Assistant [facility] [email or web address]   Dizziness Dizziness is a common problem. It is a feeling of unsteadiness or light-headedness. You may feel like you are about to faint. Dizziness can lead to injury if you stumble or fall. Anyone can become dizzy, but dizziness is more common in older adults. This condition can be caused by a number of things, including medicines, dehydration, or illness. Follow these instructions at home: Eating and drinking  Drink enough fluid to keep your urine clear or pale yellow. This helps to keep you from becoming dehydrated. Try to drink more clear fluids, such as water.  Do not drink alcohol.  Limit your caffeine intake if told to do so by your health care provider. Check ingredients and nutrition facts to see if a food or beverage contains caffeine.  Limit your salt (sodium) intake if told to do so by your health care provider. Check ingredients and nutrition facts to see if a food or beverage contains sodium. Activity  Avoid making quick movements. ? Rise slowly from chairs and steady yourself until you feel okay. ? In the morning, first sit up on the side of the bed. When you feel okay, stand slowly while you hold onto something until you know that your balance is fine.  If you need to stand in one place for a long time, move your legs often. Tighten and relax the muscles in your legs while you are standing.  Do not drive or use heavy machinery if you feel dizzy.  Avoid bending down if you feel dizzy. Place items in your home so that they are easy for you to reach without leaning over. Lifestyle  Do not use any products that contain nicotine or tobacco, such as cigarettes and e-cigarettes. If you  need help quitting, ask your health care provider.  Try to reduce your stress level by using methods such as yoga or meditation. Talk with your health care provider if you need help to manage your stress. General instructions  Watch your dizziness for any changes.  Take over-the-counter and prescription medicines only as told by your health care provider. Talk with your health care provider if you think that your dizziness is caused by a medicine that you are taking.  Tell a friend or a family member that you are feeling dizzy. If he or she notices any changes in your behavior, have this person call your health care provider.  Keep all follow-up visits as told by your health care provider. This is important. Contact a health care provider if:  Your dizziness does not go away.  Your dizziness or light-headedness gets worse.  You feel nauseous.  You have reduced hearing.  You have new symptoms.  You are unsteady on your feet or you feel like the room is spinning. Get help right away if:  You vomit or have diarrhea and are unable to eat or drink anything.  You have problems talking, walking, swallowing, or using your arms, hands, or legs.  You feel generally weak.  You are not thinking clearly or you have trouble forming sentences. It may take a friend or family member to notice this.  You have chest pain, abdominal pain, shortness of breath, or sweating.  Your vision changes.  You have  any bleeding.  You have a severe headache.  You have neck pain or a stiff neck.  You have a fever. These symptoms may represent a serious problem that is an emergency. Do not wait to see if the symptoms will go away. Get medical help right away. Call your local emergency services (911 in the [place] Do not drive yourself to the hospital. Summary  Dizziness is a feeling of unsteadiness or light-headedness. This condition can be caused by a number of things, including medicines, dehydration,  or illness.  Anyone can become dizzy, but dizziness is more common in older adults.  Drink enough fluid to keep your urine clear or pale yellow. Do not drink alcohol.  Avoid making quick movements if you feel dizzy. Monitor your dizziness for any changes. This information is not intended to replace advice given to you by your health care provider. Make sure you discuss any questions you have with your health care provider. Document Revised: 06/11/2017 Document Reviewed: 07/11/2016 Elsevier Patient Education  2020 [employer].

## 2020-05-23 NOTE — Progress Notes (Signed)
Patient presents with vertigo and last episode this morning while checking in and a worse one yesterday Patient has taken medication today and patient has eaten today. Patient denies pain at this time.

## 2020-05-24 LAB — COMP. METABOLIC PANEL (12)
AST: 19 IU/L (ref 0–40)
Albumin/Globulin Ratio: 1.5 (ref 1.2–2.2)
Albumin: 4.1 g/dL (ref 3.8–4.8)
Alkaline Phosphatase: 76 IU/L (ref 44–121)
BUN/Creatinine Ratio: 18 (ref 9–23)
BUN: 16 mg/dL (ref 6–24)
Bilirubin Total: 0.5 mg/dL (ref 0.0–1.2)
Calcium: 9.2 mg/dL (ref 8.7–10.2)
Chloride: 104 mmol/L (ref 96–106)
Creatinine, Ser: 0.87 mg/dL (ref 0.57–1.00)
GFR calc Af Amer: 91 mL/min/{1.73_m2} (ref >59)
GFR calc non Af Amer: 79 mL/min/{1.73_m2} (ref >59)
Globulin, Total: 2.8 g/dL (ref 1.5–4.5)
Glucose: 87 mg/dL (ref 65–99)
Potassium: 4.5 mmol/L (ref 3.5–5.2)
Sodium: 140 mmol/L (ref 134–144)
Total Protein: 6.9 g/dL (ref 6.0–8.5)

## 2020-05-24 LAB — CBC WITH DIFFERENTIAL/PLATELET
Basophils Absolute: 0 10*3/uL (ref 0.0–0.2)
Basos: 1 %
EOS (ABSOLUTE): 0.1 10*3/uL (ref 0.0–0.4)
Eos: 2 %
Hematocrit: 34.2 % (ref 34.0–46.6)
Hemoglobin: 11.5 g/dL (ref 11.1–15.9)
Immature Grans (Abs): 0 10*3/uL (ref 0.0–0.1)
Immature Granulocytes: 0 %
Lymphocytes Absolute: 1.4 10*3/uL (ref 0.7–3.1)
Lymphs: 34 %
MCH: 30.2 pg (ref 26.6–33.0)
MCHC: 33.6 g/dL (ref 31.5–35.7)
MCV: 90 fL (ref 79–97)
Monocytes Absolute: 0.3 10*3/uL (ref 0.1–0.9)
Monocytes: 7 %
Neutrophils Absolute: 2.2 10*3/uL (ref 1.4–7.0)
Neutrophils: 56 %
Platelets: 237 10*3/uL (ref 150–450)
RBC: 3.81 x10E6/uL (ref 3.77–5.28)
RDW: 12.6 % (ref 11.7–15.4)
WBC: 4 10*3/uL (ref 3.4–10.8)

## 2020-05-24 LAB — IRON,TIBC AND FERRITIN PANEL
Ferritin: 45 ng/mL (ref 15–150)
Iron Saturation: 39 % (ref 15–55)
Iron: 98 ug/dL (ref 27–159)
Total Iron Binding Capacity: 251 ug/dL (ref 250–450)
UIBC: 153 ug/dL (ref 131–425)

## 2020-05-24 LAB — TSH: TSH: 2.38 u[IU]/mL (ref 0.450–4.500)

## 2020-05-24 LAB — VITAMIN D 25 HYDROXY (VIT D DEFICIENCY, FRACTURES): Vit D, 25-Hydroxy: 13.6 ng/mL — ABNORMAL LOW (ref 30.0–100.0)

## 2020-05-27 ENCOUNTER — Other Ambulatory Visit: Payer: Self-pay | Admitting: Physician Assistant

## 2020-05-27 MED ORDER — VITAMIN D (ERGOCALCIFEROL) 1.25 MG (50000 UNIT) PO CAPS: 50000.0000 [IU] | ORAL_CAPSULE | ORAL | 2 refills | Status: DC

## 2020-05-29 ENCOUNTER — Telehealth: Payer: Self-pay | Admitting: *Deleted

## 2020-05-29 NOTE — Telephone Encounter (Signed)
-----   Message from Kennieth Rad, [place] sent at 05/27/2020  1:30 PM EST ----- Please call patient and let her know that her kidney, liver, and thyroid function were within normal limits.  Her hemoglobin was within normal limits and iron as well.  She should continue to take her iron as prescribed.  Her vitamin D was low, she needs to take 50,000 units once a week for the next 12 weeks and have it rechecked after that.  I will send that to her pharmacy.

## 2020-05-29 NOTE — Telephone Encounter (Signed)
Medical Assistant left message on patient's home and cell voicemail. Voicemail states to give a call back to [place] with [facility] at [phone]. Patient is aware of labs being normal. Patient advised of continuing iron and taking weekly vitamin d with a recheck being completed in 3 months.

## 2020-05-30 ENCOUNTER — Ambulatory Visit: Payer: BC Managed Care – PPO | Admitting: Family

## 2020-05-30 ENCOUNTER — Ambulatory Visit: Payer: BC Managed Care – PPO | Admitting: Family Medicine

## 2020-06-04 MED FILL — VIT D2 1.25 MG (50,000 UNIT: 1.25 MG | 28 days supply | Qty: 4 | Fill #0

## 2020-06-12 MED FILL — CETIRIZINE HCL 10 MG TABS: 10 | 30 days supply | Qty: 30 | Fill #1

## 2020-06-12 MED FILL — PANTOPRAZOLE SOD DR 40 MG T: 40 | 30 days supply | Qty: 30 | Fill #2

## 2020-06-19 ENCOUNTER — Ambulatory Visit: Payer: Self-pay | Admitting: *Deleted

## 2020-06-19 NOTE — Telephone Encounter (Signed)
I returned pt's call.   She is c/o having left lower abd cramping that is going across her lower abd with vomiting.   This also happened the week before Christmas.  She is having chills and a low grade fever.  Also a sore throat and sinus problems and a raspy voice.   I'm coughing too. 2 other people in the household are sick with similar symptoms. She has not been tested for covid.   The other family members are going to get tested hopefully today.  See triage notes.  There are no appts available at [facility] so she has decide to go to the [facility] on [address] ED.   I let her know that was a good idea and to go on over there since she has several issues she could be dealing with that can be causing the abd pain.   I encouraged her to be tested for covid even though she has had covid last year.    Reason for Disposition . [1] MILD-MODERATE pain AND [2] constant AND [3] present > 2 hours    Going to [facility] ED at [facility] on [address] and [address] now.  Answer Assessment - Initial Assessment Questions 1. LOCATION: "Where does it hurt?"      My lower left abd is cramping 2. RADIATION: "Does the pain shoot anywhere else?" (e.g., chest, back)     Shoots across the whole bottom part.    I'm vomiting too.   I had this the wk before Christmas. I started having chills last night fever 100.5.  Sore throat.   I'm having sinus problems, raspy voice, coughing non stop.   3. ONSET: "When did the pain begin?" (e.g., minutes, hours or days ago)      I've had covid last yr.  In August.   I've been vaccinated but not the booster yet.   I'm taking Alkazler.   I'm possibly pregnant.  4. SUDDEN: "Gradual or sudden onset?"     Gradually.    5. PATTERN "Does the pain come and go, or is it constant?"    - If constant: "Is it getting better, staying the same, or worsening?"      (Note: Constant means the pain never goes away completely;  most serious pain is constant and it progresses)     - If intermittent: "How long does it last?" "Do you have pain now?"     (Note: Intermittent means the pain goes away completely between bouts)     Intermittent abd cramping.  I had both periods in Nov. & Dec.    The cramping is coming and going.   On Nov. 15 I bled all that wk. With my period. No vaginal bleeding now.   Monday I finished my period.    I have acid reflux also.   I didn't eat much yesterday so I thought it was acid reflux.   6. SEVERITY: "How bad is the pain?"  (e.g., Scale 1-10; mild, moderate, or severe)   - MILD (1-3): doesn't interfere with normal activities, abdomen soft and not tender to touch    - MODERATE (4-7): interferes with normal activities or awakens from sleep, tender to touch    - SEVERE (8-10): excruciating pain, doubled over, unable to do any normal activities      Cramping maybe a 5 on scale 7. RECURRENT SYMPTOM: "Have you ever had this type of  stomach pain before?" If Yes, ask: "When was the last time?" and "What happened that time?"      I'm just having sinus problems and chills.  I didn't cramp or vomit when I was pregnant like 20 yrs ago.   8. CAUSE: "What do you think is causing the stomach pain?"     Possibly my navel I was born with a hernia.   9. RELIEVING/AGGRAVATING FACTORS: "What makes it better or worse?" (e.g., movement, antacids, bowel movement)     I've not tried taking any medications for the vomiting or cramping. 10. OTHER SYMPTOMS: "Has there been any vomiting, diarrhea, constipation, or urine problems?"       No diarrhea, denies urinary symptoms. She said 2 other people in the household are sick with similar symptoms.  She also mentioned she could be pregnant.  She wasn't sick with vomiting or cramping with her pregnancies 20 some years ago. She also mentioned she was born with a umbilical hernia.   She has noticed it poking out a little when she is lifting something heavy.   It's not  painful. 11. PREGNANCY: "Is there any chance you are pregnant?" "When was your last menstrual period?"       Yes  I did have 2 periods in Nov. And 2 in Dec.   I think I could be going into menopause, but there is a possibility I'm pregnant maybe.  Protocols used: ABDOMINAL PAIN [facility]

## 2020-06-20 ENCOUNTER — Encounter (HOSPITAL_BASED_OUTPATIENT_CLINIC_OR_DEPARTMENT_OTHER): Payer: Self-pay

## 2020-06-20 ENCOUNTER — Other Ambulatory Visit (HOSPITAL_BASED_OUTPATIENT_CLINIC_OR_DEPARTMENT_OTHER): Payer: Self-pay | Admitting: Emergency Medicine

## 2020-06-20 ENCOUNTER — Emergency Department (HOSPITAL_BASED_OUTPATIENT_CLINIC_OR_DEPARTMENT_OTHER)
Admission: EM | Admit: 2020-06-20 | Discharge: 2020-06-20 | Disposition: A | Discharge status: Home / Self Care | Payer: BC Managed Care – PPO | Attending: Emergency Medicine | Admitting: Emergency Medicine

## 2020-06-20 ENCOUNTER — Ambulatory Visit: Payer: Self-pay

## 2020-06-20 ENCOUNTER — Other Ambulatory Visit: Payer: Self-pay

## 2020-06-20 DIAGNOSIS — R059 Cough, unspecified: Secondary | ICD-10-CM | POA: Insufficient documentation

## 2020-06-20 DIAGNOSIS — M545 Low back pain, unspecified: Secondary | ICD-10-CM | POA: Diagnosis not present

## 2020-06-20 DIAGNOSIS — M5442 Lumbago with sciatica, left side: Secondary | ICD-10-CM | POA: Insufficient documentation

## 2020-06-20 DIAGNOSIS — Z03818 Encounter for observation for suspected exposure to other biological agents ruled out: Secondary | ICD-10-CM | POA: Diagnosis not present

## 2020-06-20 DIAGNOSIS — G8929 Other chronic pain: Secondary | ICD-10-CM | POA: Insufficient documentation

## 2020-06-20 DIAGNOSIS — R109 Unspecified abdominal pain: Secondary | ICD-10-CM | POA: Insufficient documentation

## 2020-06-20 LAB — URINALYSIS, ROUTINE W REFLEX MICROSCOPIC
Bilirubin Urine: NEGATIVE
Glucose, UA: NEGATIVE mg/dL
Ketones, ur: NEGATIVE mg/dL
Leukocytes,Ua: NEGATIVE
Nitrite: NEGATIVE
Protein, ur: NEGATIVE mg/dL
Specific Gravity, Urine: 1.005 (ref 1.005–1.030)
pH: 7.5 (ref 5.0–8.0)

## 2020-06-20 LAB — RAPID INFLUENZA A&B ANTIGENS
Influenza A (ARMC): NEGATIVE
Influenza B (ARMC): NEGATIVE

## 2020-06-20 LAB — URINALYSIS, MICROSCOPIC (REFLEX)

## 2020-06-20 LAB — PREGNANCY, URINE: Preg Test, Ur: NEGATIVE

## 2020-06-20 MED ORDER — METHOCARBAMOL 500 MG PO TABS: 500.0000 mg | ORAL_TABLET | Freq: Two times a day (BID) | ORAL | 0 refills | Status: DC

## 2020-06-20 MED ORDER — PREDNISONE 10 MG (21) PO TBPK: ORAL_TABLET | ORAL | 0 refills | Status: DC

## 2020-06-20 MED FILL — predniSONE 10 MG TABS: 10 | 6 days supply | Qty: 21 | Fill #0

## 2020-06-20 MED FILL — METHOCARBAMOL 500 MG TABS: 500 | 10 days supply | Qty: 20 | Fill #0

## 2020-06-20 NOTE — ED Provider Notes (Signed)
[name] Provider Note   CSN: [id number] Arrival date & time: 06/20/20  1230     History Chief Complaint  Patient presents with  . Cough    Gail Moreno is a 49 y.o. female.  HPI      Gail Moreno is a 49 y.o. female, with a history of uterine fibroid, anemia, presenting to the ED with nasal congestion, postnasal drip, fatigue, intermittent "scratchy" throat for at least the last month. States she had a course of antibiotics at the beginning of her symptoms. She endorses a cough intermittently since 2017. She also endorses lower back pain, worse on the left, with spasms, beginning over the last couple days.  She states she does work at a job where she has heavy lifting.  She also notes some soreness across the abdomen with coughing.  Covid test was done at an outside clinic today, but patient is still waiting on results. She is requesting influenza testing.  Denies fever, vomiting, diarrhea, chest pain, shortness of breath, lower extremity edema, urinary symptoms, or any other complaints.   Past Medical History:  Diagnosis Date  . Anemia   . Fibroid tumor     Patient Active Problem List   Diagnosis Date Noted  . Well woman exam with routine gynecological exam 01/26/2019  . Exertional dyspnea 04/11/2018  . Cough 04/11/2018  . Menorrhagia with irregular cycle 03/20/2016  . Fibroid uterus 03/20/2016  . Iron deficiency anemia 03/20/2016    Past Surgical History:  Procedure Laterality Date  . BREAST EXCISIONAL BIOPSY Left    2003 benign  . FOOT SURGERY    . MYRINGOTOMY WITH TUBE PLACEMENT    . SKIN GRAFT    . WISDOM TOOTH EXTRACTION       OB History    Gravida  1   Para  1   Term  1   Preterm      AB      Living  1     SAB      IAB      Ectopic      Multiple      Live Births              Family History  Problem Relation Age of Onset  . Hypertension Mother   . Hypertension Father   . Cancer Father         lungs  . Hypertension Sister   . Cancer Sister        pancreatitic  . Diabetes Maternal Aunt   . Diabetes Maternal Uncle     Social History   Tobacco Use  . Smoking status: Never Smoker  . Smokeless tobacco: Never Used  Vaping Use  . Vaping Use: Never used  Substance Use Topics  . Alcohol use: Yes    Comment: occasionally  . Drug use: No    Home Medications Prior to Admission medications   Medication Sig Start Date End Date Taking? Authorizing Provider  methocarbamol (ROBAXIN) 500 MG tablet Take 1 tablet (500 mg total) by mouth 2 (two) times daily. 06/20/20  Yes Farheen Pfahler C, PA-C  predniSONE (STERAPRED UNI-PAK 21 TAB) 10 MG (21) TBPK tablet Take 6 tabs (60mg ) day 1, 5 tabs (50mg ) day 2, 4 tabs (40mg ) day 3, 3 tabs (30mg ) day 4, 2 tabs (20mg ) day 5, and 1 tab (10mg ) day 6. 06/20/20  Yes Talley Kreiser C, PA-C  cetirizine (ZYRTEC) 10 MG tablet Take 1 tablet (10 mg total) by mouth  daily. In the evening to help with nasal congestion 05/08/20   Fulp, Cammie, MD  Ferrous Sulfate (IRON) 325 (65 Fe) MG TABS TAKE 1 TABLET BY MOUTH TWICE DAILY AFTER A MEAL 03/23/20   Fulp, Cammie, MD  pantoprazole (PROTONIX) 40 MG tablet Take 1 tablet (40 mg total) by mouth daily. 04/11/20   Julian Hy, DO  triamcinolone (NASACORT) 55 MCG/ACT AERO nasal inhaler Place 2 sprays into the nose daily. Patient not taking: Reported on 05/08/2020 04/11/20   Noemi Chapel P, DO  Vitamin D, Ergocalciferol, (DRISDOL) 1.25 MG (50000 UNIT) CAPS capsule Take 1 capsule (50,000 Units total) by mouth every 7 (seven) days. 05/27/20   Mayers, Cari S, PA-C  fluticasone (FLONASE) 50 MCG/ACT nasal spray Place 2 sprays into both nostrils daily. 03/02/19 04/11/20  Julian Hy, DO    Allergies    Dilaudid [hydromorphone hcl], Meclizine hcl, and Singulair [montelukast sodium]  Review of Systems   Review of Systems  Constitutional: Negative for fever.  HENT: Positive for congestion, postnasal drip and rhinorrhea. Negative  for trouble swallowing and voice change.   Respiratory: Positive for cough. Negative for shortness of breath.   Cardiovascular: Negative for chest pain and leg swelling.  Gastrointestinal: Negative for diarrhea, nausea and vomiting.  Genitourinary: Negative for difficulty urinating, dysuria, flank pain, frequency, hematuria and vaginal discharge.  Musculoskeletal: Positive for back pain.  Neurological: Negative for dizziness, syncope and weakness.  All other systems reviewed and are negative.   Physical Exam Updated Vital Signs BP 117/83 (BP Location: Left Arm)   Pulse 77   Temp 98.7 F (37.1 C) (Oral)   Resp 18   Ht 5\' 3"  (1.6 m)   Wt 64.9 kg   LMP 06/10/2020   SpO2 99%   BMI 25.33 kg/m   Physical Exam Vitals and nursing note reviewed.  Constitutional:      General: She is not in acute distress.    Appearance: She is well-developed. She is not diaphoretic.  HENT:     Head: Normocephalic and atraumatic.     Nose: Mucosal edema, congestion and rhinorrhea present.     Mouth/Throat:     Mouth: Mucous membranes are moist.     Pharynx: Oropharynx is clear.  Eyes:     Conjunctiva/sclera: Conjunctivae normal.  Cardiovascular:     Rate and Rhythm: Normal rate and regular rhythm.     Pulses: Normal pulses.          Radial pulses are 2+ on the right side and 2+ on the left side.       Posterior tibial pulses are 2+ on the right side and 2+ on the left side.     Heart sounds: Normal heart sounds.  Pulmonary:     Effort: Pulmonary effort is normal. No respiratory distress.     Breath sounds: Normal breath sounds.  Abdominal:     Palpations: Abdomen is soft.     Tenderness: There is no abdominal tenderness. There is no guarding.     Comments: Abdominal exam completely benign.  Musculoskeletal:     Cervical back: Neck supple.       Back:     Right lower leg: No edema.     Left lower leg: No edema.  Lymphadenopathy:     Cervical: No cervical adenopathy.  Skin:    General:  Skin is warm and dry.  Neurological:     Mental Status: She is alert.     Comments: Sensation grossly intact to  light touch in the lower extremities bilaterally. No saddle anesthesias. Strength 5/5 in the bilateral lower extremities. No noted gait deficit. Coordination intact.  Psychiatric:        Mood and Affect: Mood and affect normal.        Speech: Speech normal.        Behavior: Behavior normal.     ED Results / Procedures / Treatments   Labs (all labs ordered are listed, but only abnormal results are displayed) Labs Reviewed  URINALYSIS, ROUTINE W REFLEX MICROSCOPIC - Abnormal; Notable for the following components:      Result Value   Hgb urine dipstick SMALL (*)    All other components within normal limits  URINALYSIS, MICROSCOPIC (REFLEX) - Abnormal; Notable for the following components:   Bacteria, UA RARE (*)    All other components within normal limits  RAPID INFLUENZA A&B ANTIGENS  PREGNANCY, URINE    EKG None  Radiology No results found.  Procedures Procedures (including critical care time)  Medications Ordered in ED Medications - No data to display  ED Course  I have reviewed the triage vital signs and the nursing notes.  Pertinent labs & imaging results that were available during my care of the patient were reviewed by me and considered in my medical decision making (see chart for details).    MDM Rules/Calculators/A&P                          Patient presents with lower back pain without neurologic deficits. Benign abdominal exam. Patient is nontoxic appearing, afebrile, not tachycardic, not tachypneic, not hypotensive, excellent SPO2 on room air, and is in no apparent distress.   The patient was given instructions for home care as well as return precautions. Patient voices understanding of these instructions, accepts the plan, and is comfortable with discharge.  Final Clinical Impression(s) / ED Diagnoses Final diagnoses:  Chronic left-sided  low back pain with left-sided sciatica    Rx / DC Orders ED Discharge Orders         Ordered    predniSONE (STERAPRED UNI-PAK 21 TAB) 10 MG (21) TBPK tablet        06/20/20 1522    methocarbamol (ROBAXIN) 500 MG tablet  2 times daily        06/20/20 1530           Anselm Pancoast, PA-C 06/20/20 1537    Vanetta Mulders, MD 06/30/20 0001

## 2020-06-20 NOTE — Telephone Encounter (Signed)
Patient called stating that she has lower back pain.  She states that yesterday her pain was in her lower abdomin.  She was triaged and suggested to go to ER for evaluation.  She is calling back today because the pain has moved to her lower back and with movement is severe. She states tht her urine today looked like there was blood.  She states she has cough and is schedule at [facility] for COVID-19 testing. She states that the pain goes down both legs to about her knee area. Per protocol she was asked to go to [facility] for evaluation.  She states she will go to [employer].   Reason for Disposition . [1] Pain radiates into the thigh or further down the leg AND [2] both legs  Answer Assessment - Initial Assessment Questions 1. ONSET: "When did the pain begin?"      Yesterday got bad today 2. LOCATION: "Where does it hurt?" (upper, mid or lower back)    Lower back 3. SEVERITY: "How bad is the pain?"  (e.g., Scale 1-10; mild, moderate, or severe)   - MILD (1-3): doesn't interfere with normal activities    - MODERATE (4-7): interferes with normal activities or awakens from sleep    - SEVERE (8-10): excruciating pain, unable to do any normal activities      severe 4. PATTERN: "Is the pain constant?" (e.g., yes, no; constant, intermittent)      Comes and goes with movement 5. RADIATION: "Does the pain shoot into your legs or elsewhere?"     Both legs hurt  Hurts when coughs 6. CAUSE:  "What do you think is causing the back pain?"     unsure 7. BACK OVERUSE:  "Any recent lifting of heavy objects, strenuous work or exercise?"     At work 8. MEDICATIONS: "What have you taken so far for the pain?" (e.g., nothing, acetaminophen, NSAIDS)     no 9. NEUROLOGIC SYMPTOMS: "Do you have any weakness, numbness, or problems with bowel/bladder control?"     Leak when cough or laugh normal for her 10. OTHER SYMPTOMS: "Do you have any other symptoms?" (e.g., fever, abdominal pain, burning with urination, blood in  urine)      Bend has pain in lower abdomin, blood in urine 11. PREGNANCY: "Is there any chance you are pregnant?" (e.g., yes, no; LMP)       Just ended Monday this week  Protocols used: BACK PAIN-A-AH

## 2020-06-20 NOTE — Discharge Instructions (Signed)
°  Hand washing: Wash your hands throughout the day, but especially before and after touching the face, using the restroom, sneezing, coughing, or touching surfaces that have been coughed or sneezed upon. Hydration: Symptoms of most illnesses will be intensified and complicated by dehydration. Dehydration can also extend the duration of symptoms. Drink plenty of fluids and get plenty of rest. You should be drinking at least half a liter of water an hour to stay hydrated. Electrolyte drinks (ex. Gatorade, Powerade, Pedialyte) are also encouraged. You should be drinking enough fluids to make your urine light yellow, almost clear. If this is not the case, you are not drinking enough water. Please note that some of the treatments indicated below will not be effective if you are not adequately hydrated. Diet: Please concentrate on hydration, however, you may introduce food slowly.  Start with a clear liquid diet, progressed to a full liquid diet, and then bland solids as you are able. Pain or fever: Ibuprofen, Naproxen, or acetaminophen (generic for Tylenol) for pain or fever.  Antiinflammatory medications: Take 600 mg of ibuprofen every 6 hours or 440 mg (over the counter dose) to 500 mg (prescription dose) of naproxen every 12 hours for the next 3 days. After this time, these medications may be used as needed for pain. Take these medications with food to avoid upset stomach. Choose only one of these medications, do not take them together. Acetaminophen (generic for Tylenol): Should you continue to have additional pain while taking the ibuprofen or naproxen, you may add in acetaminophen as needed. Your daily total maximum amount of acetaminophen from all sources should be limited to 4000mg /day for persons without liver problems, or 2000mg /day for those with liver problems. Cough: Teas, warm liquids, broths, and honey can help with cough. Prednisone: Take the prednisone, as directed, in its entirety. Zyrtec or  Claritin: May add these medication daily to control underlying symptoms of congestion, sneezing, and other signs of allergies.  These medications are available over-the-counter. Generics: Cetirizine (generic for Zyrtec) and loratadine (generic for Claritin). Fluticasone: Use fluticasone (generic for Flonase), as directed, for nasal and sinus congestion.  This medication is available over-the-counter. Congestion: Plain guaifenesin (generic for plain Mucinex) may help relieve congestion. Saline sinus rinses and saline nasal sprays may also help relieve congestion. If you do not have high blood pressure, heart problems, or an allergy to such medications, you may also try phenylephrine or Sudafed. Sore throat: Warm liquids or Chloraseptic spray may help soothe a sore throat. Gargle twice a day with a salt water solution made from a half teaspoon of salt in a cup of warm water.  Follow up: Follow up with a primary care provider within the next two weeks should symptoms fail to resolve. Return: Return to the ED for significantly worsening symptoms, shortness of breath, persistent vomiting, large amounts of blood in stool, or any other major concerns.  For prescription assistance, may try using prescription discount sites or apps, such as goodrx.com  Methocarbamol: Methocarbamol (generic for Robaxin) is a muscle relaxer and can help relieve stiff muscles or muscle spasms.  Do not drive or perform other dangerous activities while taking this medication as it can cause drowsiness as well as changes in reaction time and judgement.

## 2020-06-20 NOTE — ED Triage Notes (Signed)
Pt c/o flu like sx x 2 days-also c/o "sciatica"-NAD-slow gait

## 2020-06-27 MED FILL — VIT D2 1.25 MG (50,000 UNIT: 1.25 MG | 28 days supply | Qty: 4 | Fill #1

## 2020-07-08 ENCOUNTER — Ambulatory Visit: Payer: BC Managed Care – PPO | Admitting: Obstetrics and Gynecology

## 2020-07-15 ENCOUNTER — Other Ambulatory Visit: Payer: Self-pay | Admitting: Family Medicine

## 2020-07-15 ENCOUNTER — Ambulatory Visit: Payer: BC Managed Care – PPO | Attending: Family Medicine | Admitting: Family Medicine

## 2020-07-15 ENCOUNTER — Other Ambulatory Visit: Payer: Self-pay

## 2020-07-15 ENCOUNTER — Encounter: Payer: Self-pay | Admitting: Family Medicine

## 2020-07-15 VITALS — BP 106/69 | HR 72 | Ht 63.0 in | Wt 147.2 lb

## 2020-07-15 DIAGNOSIS — K219 Gastro-esophageal reflux disease without esophagitis: Secondary | ICD-10-CM

## 2020-07-15 DIAGNOSIS — M545 Low back pain, unspecified: Secondary | ICD-10-CM

## 2020-07-15 DIAGNOSIS — G8929 Other chronic pain: Secondary | ICD-10-CM

## 2020-07-15 DIAGNOSIS — R112 Nausea with vomiting, unspecified: Secondary | ICD-10-CM | POA: Diagnosis not present

## 2020-07-15 MED ORDER — ONDANSETRON HCL 4 MG PO TABS: 4.0000 mg | ORAL_TABLET | Freq: Three times a day (TID) | ORAL | 0 refills | Status: DC | PRN

## 2020-07-15 MED ORDER — LIDOCAINE 5 % EX PTCH: 1.0000 | MEDICATED_PATCH | CUTANEOUS | 1 refills | Status: DC

## 2020-07-15 MED FILL — ONDANSETRON HCL 4 MG TABLET: 4 | 5 days supply | Qty: 18 | Fill #0

## 2020-07-15 MED FILL — LIDOCAINE PATCH 5%: 5 | 30 days supply | Qty: 30 | Fill #0

## 2020-07-15 MED FILL — FERROUS SULFATE 325 MG TAB: 325 (65 FE) | 30 days supply | Qty: 60 | Fill #0

## 2020-07-15 NOTE — Progress Notes (Signed)
[facility] for back pain. Still having pain in lower back not as bad as a before. Throwing up due to acid reflux.

## 2020-07-15 NOTE — Patient Instructions (Signed)
Food Choices for Gastroesophageal Reflux Disease, Adult When you have gastroesophageal reflux disease (GERD), the foods you eat and your eating habits are very important. Choosing the right foods can help ease your discomfort. Think about working with a food expert (dietitian) to help you make good choices. What are tips for following this plan? Reading food labels  Look for foods that are low in saturated fat. Foods that may help with your symptoms include: ? Foods that have less than 5% of daily value (DV) of fat. ? Foods that have 0 grams of trans fat. Cooking  Do not fry your food.  Cook your food by baking, steaming, grilling, or broiling. These are all methods that do not need a lot of fat for cooking.  To add flavor, try to use herbs that are low in spice and acidity. Meal planning  Choose healthy foods that are low in fat, such as: ? Fruits and vegetables. ? Whole grains. ? Low-fat dairy products. ? Lean meats, fish, and poultry.  Eat small meals often instead of eating 3 large meals each day. Eat your meals slowly in a place where you are relaxed. Avoid bending over or lying down until 2-3 hours after eating.  Limit high-fat foods such as fatty meats or fried foods.  Limit your intake of fatty foods, such as oils, butter, and shortening.  Avoid the following as told by your doctor: ? Foods that cause symptoms. These may be different for different people. Keep a food diary to keep track of foods that cause symptoms. ? Alcohol. ? Drinking a lot of liquid with meals. ? Eating meals during the 2-3 hours before bed.   Lifestyle  Stay at a healthy weight. Ask your doctor what weight is healthy for you. If you need to lose weight, work with your doctor to do so safely.  Exercise for at least 30 minutes on 5 or more days each week, or as told by your doctor.  Wear loose-fitting clothes.  Do not smoke or use any products that contain nicotine or tobacco. If you need help  quitting, ask your doctor.  Sleep with the head of your bed higher than your feet. Use a wedge under the mattress or blocks under the bed frame to raise the head of the bed.  Chew sugar-free gum after meals. What foods should eat? Eat a healthy, well-balanced diet of fruits, vegetables, whole grains, low-fat dairy products, lean meats, fish, and poultry. Each person is different. Foods that may cause symptoms in one person may not cause any symptoms in another person. Work with your doctor to find foods that are safe for you. The items listed above may not be a complete list of what you can eat and drink. Contact a food expert for more options.   What foods should I avoid? Limiting some of these foods may help in managing the symptoms of GERD. Everyone is different. Talk with a food expert or your doctor to help you find the exact foods to avoid, if any. Fruits Any fruits prepared with added fat. Any fruits that cause symptoms. For some people, this may include citrus fruits, such as oranges, grapefruit, pineapple, and lemons. Vegetables Deep-fried vegetables. French fries. Any vegetables prepared with added fat. Any vegetables that cause symptoms. For some people, this may include tomatoes and tomato products, chili peppers, onions and garlic, and horseradish. Grains Pastries or quick breads with added fat. Meats and other proteins High-fat meats, such as fatty beef or pork,   hot dogs, ribs, ham, sausage, salami, and bacon. Fried meat or protein, including fried fish and fried chicken. Nuts and nut butters, in large amounts. Dairy Whole milk and chocolate milk. Sour cream. Cream. Ice cream. Cream cheese. Milkshakes. Fats and oils Butter. Margarine. Shortening. Ghee. Beverages Coffee and tea, with or without caffeine. Carbonated beverages. Sodas. Energy drinks. Fruit juice made with acidic fruits, such as orange or grapefruit. Tomato juice. Alcoholic drinks. Sweets and desserts Chocolate and  cocoa. Donuts. Seasonings and condiments Pepper. Peppermint and spearmint. Added salt. Any condiments, herbs, or seasonings that cause symptoms. For some people, this may include curry, hot sauce, or vinegar-based salad dressings. The items listed above may not be a complete list of what you should not eat and drink. Contact a food expert for more options. Questions to ask your doctor Diet and lifestyle changes are often the first steps that are taken to manage symptoms of GERD. If diet and lifestyle changes do not help, talk with your doctor about taking medicines. Where to find more information  International Foundation for Gastrointestinal Disorders: aboutgerd.org Summary  When you have GERD, food and lifestyle choices are very important in easing your symptoms.  Eat small meals often instead of 3 large meals a day. Eat your meals slowly and in a place where you are relaxed.  Avoid bending over or lying down until 2-3 hours after eating.  Limit high-fat foods such as fatty meats or fried foods. This information is not intended to replace advice given to you by your health care provider. Make sure you discuss any questions you have with your health care provider. Document Revised: 12/18/2019 Document Reviewed: 12/18/2019 Elsevier Patient Education  2021 [employer]  

## 2020-07-15 NOTE — Progress Notes (Signed)
Subjective:  Patient ID: Gail Moreno, female    DOB: May 06, 1971  Age: 50 y.o. MRN: [id number]  CC: Hospitalization Follow-up   HPI [name] presents for a follow-up from the ED.  She was seen on 06/20/2020 Her back pain is not as bad. Pain is across her back and is absent now but occurs when she bends to pick up something.She has no numbness in her legs. Robaxin has been ineffective and so she stopped it. She does a lot of lifting at work. She has numbness in her hands and uses wrist braces for her symptoms.  Symptoms are worse when she lies down on either side of her body with her head on her arm folded over. Her reflux is not controlled on Protonix and she has been spitting up or throwing up in the morning. Last dose of Protonix was today. She coughs until she gags.  Was seen by pulmonary in 03/2020 when she was commenced on Protonix. Denies presence of abdominal pain.  Past Medical History:  Diagnosis Date  . Anemia   . Fibroid tumor     Past Surgical History:  Procedure Laterality Date  . BREAST EXCISIONAL BIOPSY Left    2003 benign  . FOOT SURGERY    . MYRINGOTOMY WITH TUBE PLACEMENT    . SKIN GRAFT    . WISDOM TOOTH EXTRACTION      Family History  Problem Relation Age of Onset  . Hypertension Mother   . Hypertension Father   . Cancer Father        lungs  . Hypertension Sister   . Cancer Sister        pancreatitic  . Diabetes Maternal Aunt   . Diabetes Maternal Uncle     Allergies  Allergen Reactions  . Dilaudid [Hydromorphone Hcl] Nausea And Vomiting  . Meclizine Hcl Other (See Comments)    Caused confusion and sluggishness  . Singulair [Montelukast Sodium]     Outpatient Medications Prior to Visit  Medication Sig Dispense Refill  . cetirizine (ZYRTEC) 10 MG tablet Take 1 tablet (10 mg total) by mouth daily. In the evening to help with nasal congestion 30 tablet 11  . Ferrous Sulfate (IRON) 325 (65 Fe) MG TABS TAKE 1 TABLET BY MOUTH TWICE DAILY AFTER  A MEAL 60 tablet 0  . pantoprazole (PROTONIX) 40 MG tablet Take 1 tablet (40 mg total) by mouth daily. 30 tablet 6  . triamcinolone (NASACORT) 55 MCG/ACT AERO nasal inhaler Place 2 sprays into the nose daily. 1 each 12  . Vitamin D, Ergocalciferol, (DRISDOL) 1.25 MG (50000 UNIT) CAPS capsule Take 1 capsule (50,000 Units total) by mouth every 7 (seven) days. 4 capsule 2  . methocarbamol (ROBAXIN) 500 MG tablet Take 1 tablet (500 mg total) by mouth 2 (two) times daily. (Patient not taking: Reported on 07/15/2020) 20 tablet 0  . predniSONE (STERAPRED UNI-PAK 21 TAB) 10 MG (21) TBPK tablet Take 6 tabs (60mg ) day 1, 5 tabs (50mg ) day 2, 4 tabs (40mg ) day 3, 3 tabs (30mg ) day 4, 2 tabs (20mg ) day 5, and 1 tab (10mg ) day 6. (Patient not taking: Reported on 07/15/2020) 21 tablet 0   No facility-administered medications prior to visit.     ROS Review of Systems  Constitutional: Negative for activity change, appetite change and fatigue.  HENT: Negative for congestion, sinus pressure and sore throat.   Eyes: Negative for visual disturbance.  Respiratory: Negative for cough, chest tightness, shortness of breath and wheezing.  Cardiovascular: Negative for chest pain and palpitations.  Gastrointestinal: Positive for nausea and vomiting. Negative for abdominal distention, abdominal pain and constipation.  Endocrine: Negative for polydipsia.  Genitourinary: Negative for dysuria and frequency.  Musculoskeletal: Positive for back pain. Negative for arthralgias.  Skin: Negative for rash.  Neurological: Positive for numbness. Negative for tremors and light-headedness.  Hematological: Does not bruise/bleed easily.  Psychiatric/Behavioral: Negative for agitation and behavioral problems.    Objective:  BP 106/69   Pulse 72   Ht 5\' 3"  (1.6 m)   Wt 147 lb 3.2 oz (66.8 kg)   SpO2 100%   BMI 26.08 kg/m   BP/Weight 07/15/2020 06/20/2020 63/01/7563  Systolic BP 332 951 99  Diastolic BP 69 72 67  Wt. (Lbs)  147.2 143 145  BMI 26.08 25.33 25.69      Physical Exam Constitutional:      Appearance: She is well-developed.  Neck:     Vascular: No JVD.  Cardiovascular:     Rate and Rhythm: Normal rate.     Heart sounds: Normal heart sounds. No murmur heard.   Pulmonary:     Effort: Pulmonary effort is normal.     Breath sounds: Normal breath sounds. No wheezing or rales.  Chest:     Chest wall: No tenderness.  Abdominal:     General: Bowel sounds are normal. There is no distension.     Palpations: Abdomen is soft. There is no mass.     Tenderness: There is no abdominal tenderness.  Musculoskeletal:        General: Normal range of motion.     Right lower leg: No edema.     Left lower leg: No edema.  Neurological:     Mental Status: She is alert and oriented to person, place, and time.  Psychiatric:        Mood and Affect: Mood normal.     CMP Latest Ref Rng & Units 05/23/2020 03/11/2020 08/03/2019  Glucose 65 - 99 mg/dL 87 87 72  BUN 6 - 24 mg/dL 16 15 16   Creatinine 0.57 - 1.00 mg/dL 0.87 0.77 0.85  Sodium 134 - 144 mmol/L 140 138 134  Potassium 3.5 - 5.2 mmol/L 4.5 4.4 3.6  Chloride 96 - 106 mmol/L 104 107 103  CO2 22 - 32 mmol/L - 22 22  Calcium 8.7 - 10.2 mg/dL 9.2 9.1 9.2  Total Protein 6.0 - 8.5 g/dL 6.9 - -  Total Bilirubin 0.0 - 1.2 mg/dL 0.5 - -  Alkaline Phos 44 - 121 IU/L 76 - -  AST 0 - 40 IU/L 19 - -  ALT 0 - 44 U/L - - -    Lipid Panel     Component Value Date/Time   CHOL 135 02/08/2019 0913   TRIG 54 02/08/2019 0913   HDL 48 02/08/2019 0913   LDLCALC 76 02/08/2019 0913    CBC    Component Value Date/Time   WBC 4.0 05/23/2020 1101   WBC 3.9 (L) 03/11/2020 0010   RBC 3.81 05/23/2020 1101   RBC 3.72 (L) 03/11/2020 0010   HGB 11.5 05/23/2020 1101   HCT 34.2 05/23/2020 1101   PLT 237 05/23/2020 1101   MCV 90 05/23/2020 1101   MCH 30.2 05/23/2020 1101   MCH 30.4 03/11/2020 0010   MCHC 33.6 05/23/2020 1101   MCHC 33.1 03/11/2020 0010   RDW 12.6  05/23/2020 1101   LYMPHSABS 1.4 05/23/2020 1101   MONOABS 0.2 02/20/2019 1359   EOSABS 0.1 05/23/2020  1101   BASOSABS 0.0 05/23/2020 1101    Lab Results  Component Value Date   HGBA1C 5.4 01/25/2020    Assessment & Plan:  1. Gastroesophageal reflux disease without esophagitis Uncontrolled on Protonix Hold Protonix for 2 weeks after which we will perform H. pylori breath test We will send of H. pylori and if negative will refer to GI for endoscopy - H. pylori breath test; Future  2. Chronic midline low back pain without sciatica Uncontrolled on muscle relaxant Her repetitive heavy lifting at work also contributing Placed on Lidoderm patch - lidocaine (LIDODERM) 5 %; Place 1 patch onto the skin daily. Remove & Discard patch within 12 hours or as directed by MD  Dispense: 30 patch; Refill: 1  3. Nausea and vomiting, intractability of vomiting not specified, unspecified vomiting type Secondary to GERD - ondansetron (ZOFRAN) 4 MG tablet; Take 1 tablet (4 mg total) by mouth every 8 (eight) hours as needed for nausea or vomiting.  Dispense: 20 tablet; Refill: 0   Meds ordered this encounter  Medications  . ondansetron (ZOFRAN) 4 MG tablet    Sig: Take 1 tablet (4 mg total) by mouth every 8 (eight) hours as needed for nausea or vomiting.    Dispense:  20 tablet    Refill:  0  . lidocaine (LIDODERM) 5 %    Sig: Place 1 patch onto the skin daily. Remove & Discard patch within 12 hours or as directed by MD    Dispense:  30 patch    Refill:  1    Follow-up: Return if symptoms worsen or fail to improve.       Charlott Rakes, MD, FAAFP. [facility] [place], [place]   07/15/2020, 1:56 PM

## 2020-07-16 ENCOUNTER — Telehealth: Payer: Self-pay | Admitting: Family Medicine

## 2020-07-16 NOTE — Telephone Encounter (Signed)
Will route to PCP for review. 

## 2020-07-16 NOTE — Telephone Encounter (Signed)
Copied from [place] [id number]. Topic: General - Inquiry >> Jul 16, 2020  2:50 PM Greggory Keen D wrote: Reason for CRM: Pt called asking If she can get a note for work for light duty.  She cant bend but to a certain extent.    CB# [phone]

## 2020-07-17 NOTE — Telephone Encounter (Signed)
Sent to patient via MyChart.

## 2020-07-22 MED FILL — CETIRIZINE HCL 10 MG TABS: 10 | 30 days supply | Qty: 30 | Fill #2

## 2020-07-23 ENCOUNTER — Encounter: Payer: Self-pay | Admitting: Obstetrics and Gynecology

## 2020-07-23 ENCOUNTER — Other Ambulatory Visit: Payer: Self-pay

## 2020-07-23 ENCOUNTER — Ambulatory Visit (INDEPENDENT_AMBULATORY_CARE_PROVIDER_SITE_OTHER): Payer: BC Managed Care – PPO | Admitting: Obstetrics and Gynecology

## 2020-07-23 VITALS — BP 101/74 | HR 81 | Ht 63.0 in | Wt 146.4 lb

## 2020-07-23 DIAGNOSIS — R1030 Lower abdominal pain, unspecified: Secondary | ICD-10-CM | POA: Diagnosis not present

## 2020-07-23 DIAGNOSIS — D251 Intramural leiomyoma of uterus: Secondary | ICD-10-CM | POA: Diagnosis not present

## 2020-07-23 DIAGNOSIS — B373 Candidiasis of vulva and vagina: Secondary | ICD-10-CM

## 2020-07-23 DIAGNOSIS — D252 Subserosal leiomyoma of uterus: Secondary | ICD-10-CM

## 2020-07-23 DIAGNOSIS — B3731 Acute candidiasis of vulva and vagina: Secondary | ICD-10-CM

## 2020-07-23 DIAGNOSIS — R109 Unspecified abdominal pain: Secondary | ICD-10-CM | POA: Insufficient documentation

## 2020-07-23 LAB — POCT URINALYSIS DIP (DEVICE)
Bilirubin Urine: NEGATIVE
Glucose, UA: NEGATIVE mg/dL
Ketones, ur: NEGATIVE mg/dL
Nitrite: NEGATIVE
Protein, ur: NEGATIVE mg/dL
Specific Gravity, Urine: 1.025 (ref 1.005–1.030)
Urobilinogen, UA: 0.2 mg/dL (ref 0.0–1.0)
pH: 7 (ref 5.0–8.0)

## 2020-07-23 LAB — POCT PREGNANCY, URINE: Preg Test, Ur: NEGATIVE

## 2020-07-23 MED ORDER — FLUCONAZOLE 150 MG PO TABS: 150.0000 mg | ORAL_TABLET | Freq: Once | ORAL | 1 refills | Status: AC

## 2020-07-23 MED FILL — VIT D2 1.25 MG (50,000 UNIT: 1.25 MG | 28 days supply | Qty: 4 | Fill #2

## 2020-07-23 NOTE — Progress Notes (Signed)
   Subjective:    Patient ID: Gail Moreno, female    DOB: 04/14/1971, 50 y.o.   MRN: [id number]  HPI 50 yo G1P1, SVD x 1.  Pt c/o 2 1/2 month history of abdominal pain.  Pain is intermittent. Pain is crampy and irregular.  Mild nausea noted sporadically.  Mild dyspareunia noted. Pt notes regular cycles.  Menses last 4-5 days.  Heavy at beginning. Trichomonas infection noted 11/21.  Pt was treated.  Review of Systems  Constitutional: Negative.   HENT: Negative.   Respiratory: Negative.   Cardiovascular: Negative.   Gastrointestinal: Positive for abdominal pain.  Genitourinary: Positive for dyspareunia. Negative for difficulty urinating, menstrual problem, pelvic pain and vaginal bleeding.  Musculoskeletal: Positive for back pain.  Neurological: Negative.        Objective:   Physical Exam Vitals reviewed.  Constitutional:      General: She is not in acute distress.    Appearance: Normal appearance. She is normal weight. She is not ill-appearing.  HENT:     Head: Normocephalic and atraumatic.  Cardiovascular:     Rate and Rhythm: Normal rate and regular rhythm.     Heart sounds: Normal heart sounds.  Pulmonary:     Effort: Pulmonary effort is normal.     Breath sounds: Normal breath sounds.  Abdominal:     General: Abdomen is flat.     Palpations: Abdomen is soft.     Tenderness: There is no abdominal tenderness. There is no guarding.     Comments: Small mass in lower abdomen felt  Genitourinary:    Comments: SVE: cvx WNL Thick cheesy white discharge in vagina c/w yeast SVE: No CMt, no adnexal pain bilaterally Uterus is bulky and slightly enlarged. No uterine pain Musculoskeletal:        General: Normal range of motion.  Neurological:     Mental Status: She is alert.    Vitals:   07/23/20 1113  BP: 101/74  Pulse: 81         Assessment & Plan:   1. Lower abdominal pain Do not believe current discomfort is from a gyn source Pt does have fibroid uterus so will  order pelvic ultrasound Check urine culture.  2. Fibroid uterus. Check u/s for current size Uterus has not been evaluated in 5 years  Virtual follow up in 1 month    Griffin Basil, MD Faculty Attending, [facility]

## 2020-07-23 NOTE — Telephone Encounter (Signed)
Will route to PCP for review. 

## 2020-07-23 NOTE — Telephone Encounter (Signed)
Patient presented in clinic now asking for her light duty letter to be reversed, stating that she can work to full extent.  Please follow up if appropriate.

## 2020-07-23 NOTE — Patient Instructions (Signed)
Uterine Fibroids  Uterine fibroids, also called leiomyomas, are noncancerous (benign) tumors that can grow in the uterus. They can cause heavy menstrual bleeding and pain. Fibroids may also grow in the fallopian tubes, cervix, or tissues (ligaments) near the uterus. You may have one or many fibroids. Fibroids vary in size, weight, and where they grow in the uterus. Some can become quite large. Most fibroids do not require medical treatment. What are the causes? The cause of this condition is not known. What increases the risk? You are more likely to develop this condition if you:  Are in your 30s or 40s and have not gone through menopause.  Have a family history of this condition.  Are of African American descent.  Started your menstrual period at age 102 or younger.  Have never given birth.  Are overweight or obese. What are the signs or symptoms? Many women do not have any symptoms. Symptoms of this condition may include:  Heavy menstrual bleeding.  Bleeding between menstrual periods.  Pain and pressure in the pelvic area, between your hip bones.  Pain during sex.  Bladder problems, such as needing to urinate right away or more often than usual.  Inability to have children (infertility).  Failure to carry pregnancy to term (miscarriage). How is this diagnosed? This condition may be diagnosed based on:  Your symptoms and medical history.  A physical exam.  A pelvic exam that includes feeling for any tumors.  Imaging tests, such as ultrasound or MRI. How is this treated? Treatment for this condition may include follow-up visits with your health care provider to monitor your fibroids for any changes. Other treatment may include:  Medicines, such as: ? Medicines to relieve pain, including aspirin and NSAIDs, such as ibuprofen or naproxen. ? Hormone therapy. Treatment may be given as a pill or an injection, or it may be inserted into the uterus using an intrauterine  device (IUD).  Surgery that would do one of the following: ? Remove the fibroids (myomectomy). This may be recommended if fibroids affect your fertility and you want to become pregnant. ? Remove the uterus (hysterectomy). ? Block the blood supply to the fibroids (uterine artery embolization). This can cause them to shrink and die. Follow these instructions at home: Medicines  Take over-the-counter and prescription medicines only as told by your health care provider.  Ask your health care provider if you should take iron pills or eat more iron-rich foods, such as dark green, leafy vegetables. Heavy menstrual bleeding can cause low iron levels. Managing pain If directed, apply heat to your back or abdomen to reduce pain. Use the heat source that your health care provider recommends, such as a moist heat pack or a heating pad. To apply heat:  Place a towel between your skin and the heat source.  Leave the heat on for 20-30 minutes.  Remove the heat if your skin turns bright red. This is especially important if you are unable to feel pain, heat, or cold. You may have a greater risk of getting burned.   General instructions  Pay close attention to your menstrual cycle. Tell your health care provider about any changes, such as: ? Heavier bleeding that requires you to change your pads or tampons more than usual. ? A change in the number of days that your menstrual period lasts. ? A change in symptoms that come with your menstrual period, such as back pain or cramps in your abdomen.  Keep all follow-up visits. This is  important, especially if your fibroids need to be monitored for any changes. Contact a health care provider if you:  Have pelvic pain, back pain, or cramps in your abdomen that do not get better with medicine or heat.  Develop new bleeding between menstrual periods.  Have increased bleeding during or between menstrual periods.  Feel more tired or weak than usual.  Feel  light-headed. Get help right away if you:  Faint.  Have pelvic pain that suddenly gets worse.  Have severe vaginal bleeding that soaks a tampon or pad in 30 minutes or less. Summary  Uterine fibroids are noncancerous (benign) tumors that can develop in the uterus.  The exact cause of this condition is not known.  Most fibroids do not require medical treatment unless they affect your ability to have children (fertility).  Contact a health care provider if you have pelvic pain, back pain, or cramps in your abdomen that do not get better with medicines.  Get help right away if you faint, have pelvic pain that suddenly gets worse, or have severe vaginal bleeding. This information is not intended to replace advice given to you by your health care provider. Make sure you discuss any questions you have with your health care provider. Document Revised: 01/09/2020 Document Reviewed: 01/09/2020 Elsevier Patient Education  [place].

## 2020-07-23 NOTE — Telephone Encounter (Signed)
She has me confused.  At her office visit 8 days ago she had no back pain at rest but only when she lifts, the next day she requested a note stating she could only work light duty and now she wants a note stating can work full duty.

## 2020-07-23 NOTE — Telephone Encounter (Signed)
Pt states that her sciatica pain is better and she would like a letter for her employer letting them know that she is able to do full duty work.

## 2020-07-24 NOTE — Telephone Encounter (Signed)
Letter sent via My Chart.

## 2020-07-25 LAB — URINE CULTURE: Organism ID, Bacteria: NO GROWTH

## 2020-07-26 ENCOUNTER — Ambulatory Visit: Payer: BC Managed Care – PPO | Attending: Family Medicine

## 2020-07-26 ENCOUNTER — Other Ambulatory Visit: Payer: Self-pay

## 2020-07-29 ENCOUNTER — Other Ambulatory Visit: Payer: Self-pay

## 2020-07-29 ENCOUNTER — Ambulatory Visit: Payer: BC Managed Care – PPO | Attending: Family Medicine

## 2020-07-29 DIAGNOSIS — K219 Gastro-esophageal reflux disease without esophagitis: Secondary | ICD-10-CM | POA: Diagnosis not present

## 2020-07-30 ENCOUNTER — Other Ambulatory Visit: Payer: Self-pay | Admitting: Family Medicine

## 2020-07-30 DIAGNOSIS — K219 Gastro-esophageal reflux disease without esophagitis: Secondary | ICD-10-CM

## 2020-07-30 LAB — H. PYLORI BREATH TEST: H pylori Breath Test: NEGATIVE

## 2020-07-31 ENCOUNTER — Encounter: Payer: Self-pay | Admitting: Gastroenterology

## 2020-08-01 ENCOUNTER — Ambulatory Visit (HOSPITAL_COMMUNITY): Payer: BC Managed Care – PPO

## 2020-08-05 ENCOUNTER — Ambulatory Visit (HOSPITAL_BASED_OUTPATIENT_CLINIC_OR_DEPARTMENT_OTHER)
Admission: RE | Admit: 2020-08-05 | Discharge: 2020-08-05 | Disposition: A | Discharge status: Home / Self Care | Payer: BC Managed Care – PPO | Source: Ambulatory Visit | Attending: Obstetrics and Gynecology | Admitting: Obstetrics and Gynecology

## 2020-08-05 ENCOUNTER — Other Ambulatory Visit: Payer: Self-pay

## 2020-08-05 DIAGNOSIS — D252 Subserosal leiomyoma of uterus: Secondary | ICD-10-CM | POA: Diagnosis not present

## 2020-08-05 DIAGNOSIS — D250 Submucous leiomyoma of uterus: Secondary | ICD-10-CM | POA: Diagnosis not present

## 2020-08-05 DIAGNOSIS — N852 Hypertrophy of uterus: Secondary | ICD-10-CM | POA: Diagnosis not present

## 2020-08-05 DIAGNOSIS — D251 Intramural leiomyoma of uterus: Secondary | ICD-10-CM | POA: Insufficient documentation

## 2020-08-21 ENCOUNTER — Encounter: Payer: Self-pay | Admitting: Gastroenterology

## 2020-08-21 ENCOUNTER — Ambulatory Visit (INDEPENDENT_AMBULATORY_CARE_PROVIDER_SITE_OTHER): Payer: BC Managed Care – PPO | Admitting: Gastroenterology

## 2020-08-21 VITALS — BP 110/62 | HR 73 | Ht 63.0 in | Wt 153.2 lb

## 2020-08-21 DIAGNOSIS — R059 Cough, unspecified: Secondary | ICD-10-CM

## 2020-08-21 DIAGNOSIS — D509 Iron deficiency anemia, unspecified: Secondary | ICD-10-CM

## 2020-08-21 DIAGNOSIS — R1030 Lower abdominal pain, unspecified: Secondary | ICD-10-CM | POA: Diagnosis not present

## 2020-08-21 DIAGNOSIS — Z8371 Family history of colonic polyps: Secondary | ICD-10-CM

## 2020-08-21 DIAGNOSIS — K219 Gastro-esophageal reflux disease without esophagitis: Secondary | ICD-10-CM

## 2020-08-21 MED ORDER — CLENPIQ 10-3.5-12 MG-GM -GM/160ML PO SOLN: 1.0000 | Freq: Once | ORAL | 0 refills | Status: AC

## 2020-08-21 MED ORDER — PANTOPRAZOLE SODIUM 40 MG PO TBEC: 40.0000 mg | DELAYED_RELEASE_TABLET | Freq: Every day | ORAL | 11 refills | Status: DC

## 2020-08-21 NOTE — Patient Instructions (Addendum)
If you are age 49 or older, your body mass index should be between 23-30. Your Body mass index is 27.15 kg/m. If this is out of the aforementioned range listed, please consider follow up with your Primary Care Provider.  If you are age 57 or younger, your body mass index should be between 19-25. Your Body mass index is 27.15 kg/m. If this is out of the aformentioned range listed, please consider follow up with your Primary Care Provider.   We have sent the following medications to your pharmacy for you to pick up at your convenience: Protonix 40mg  daily Clenpiq  Keep appointment with ENT   You have been scheduled for a colonoscopy. Please follow written instructions given to you at your visit today.  Please pick up your prep supplies at the pharmacy within the next 1-3 days. If you use inhalers (even only as needed), please bring them with you on the day of your procedure.  You have been scheduled for a colonoscopy. Please follow written instructions given to you at your visit today.  Please pick up your prep supplies at the pharmacy within the next 1-3 days. If you use inhalers (even only as needed), please bring them with you on the day of your procedure.  Please call with any questions or concerns.  Thank you,  Dr. Jackquline Denmark

## 2020-08-21 NOTE — Progress Notes (Signed)
Chief Complaint:   Referring Provider:  Charlott Rakes, MD      ASSESSMENT AND PLAN;   #1. GERD with cough.  #2. Lower abdo pain, likely d/t GYN causes.    #3. IDA likely d/t uterine fibroids.  #4. FH of polyps (mom and dad in 38s) for high risk colorectal cancer screening.   Plan: -EGD/Colon for further evaluation. -Has appt with ENT for postnasal drip -protonix 40mg  po QD #30, 11 refills. -Nonpharmacologic means of reflux control was stressed.   Discussed risks & benefits. Risks including rare perforation req laparotomy, bleeding after bx/polypectomy req blood transfusion, rarely missing neoplasms, risks of anesthesia/sedation. Benefits outweigh the risks. Patient agrees to proceed. All the questions were answered. Consent forms given for review.    HPI:    Gail Moreno is a 50 y.o. female  With history of longstanding intermittent heartburn, has been on Protonix 40 mg p.o. once a day for last 3 years.  She was on OTC omeprazole before then.  Now with postprandial cough and postnasal drip.  She has appointment with ENT coming up.  No odynophagia or dysphagia.  Has alternating diarrhea and constipation.  Some lower abdominal discomfort.  Has been evaluated by GYN.  Ultrasound pelvis showed multiple fibroids largest measuring 7.4 cm.  She also has history of heavy menstrual cycles and has IDA, well controlled on p.o. iron.  Family history of colonic polyps both in mother and father at age below 18.  He is also been advised colonoscopy.  No weight loss or loss of appetite.  No fever chills or night sweats. Past Medical History:  Diagnosis Date  . Anemia   . Fibroid tumor     Past Surgical History:  Procedure Laterality Date  . BREAST EXCISIONAL BIOPSY Left    2003 benign  . FOOT SURGERY    . MYRINGOTOMY WITH TUBE PLACEMENT    . SKIN GRAFT    . WISDOM TOOTH EXTRACTION      Family History  Problem Relation Age of Onset  . Hypertension Mother   .  Hypertension Father   . Cancer Father        lungs  . Kidney disease Father   . Hypertension Sister   . Cancer Sister        pancreatitic  . Pancreatic cancer Sister   . Diabetes Maternal Aunt   . Diabetes Maternal Uncle   . Kidney disease Paternal Aunt     Social History   Tobacco Use  . Smoking status: Never Smoker  . Smokeless tobacco: Never Used  Vaping Use  . Vaping Use: Never used  Substance Use Topics  . Alcohol use: Yes    Comment: occasionally  . Drug use: No    Current Outpatient Medications  Medication Sig Dispense Refill  . cetirizine (ZYRTEC) 10 MG tablet Take 1 tablet (10 mg total) by mouth daily. In the evening to help with nasal congestion 30 tablet 11  . Ferrous Sulfate (IRON) 325 (65 Fe) MG TABS TAKE 1 TABLET BY MOUTH TWICE DAILY AFTER A MEAL 60 tablet 0  . triamcinolone (NASACORT) 55 MCG/ACT AERO nasal inhaler Place 2 sprays into the nose daily. 1 each 12  . Vitamin D, Ergocalciferol, (DRISDOL) 1.25 MG (50000 UNIT) CAPS capsule Take 1 capsule (50,000 Units total) by mouth every 7 (seven) days. 4 capsule 2  . lidocaine (LIDODERM) 5 % Place 1 patch onto the skin daily. Remove & Discard patch within 12 hours or as directed  by MD (Patient not taking: Reported on 08/21/2020) 30 patch 1  . pantoprazole (PROTONIX) 40 MG tablet Take 1 tablet (40 mg total) by mouth daily. (Patient not taking: No sig reported) 30 tablet 6   No current facility-administered medications for this visit.    Allergies  Allergen Reactions  . Dilaudid [Hydromorphone Hcl] Nausea And Vomiting  . Meclizine Hcl Other (See Comments)    Caused confusion and sluggishness  . Singulair [Montelukast Sodium]     Review of Systems:  Constitutional: Denies fever, chills, diaphoresis, appetite change and fatigue.  HEENT: Denies photophobia, eye pain, redness, hearing loss, ear pain, congestion, sore throat, rhinorrhea, sneezing, mouth sores, neck pain, neck stiffness and tinnitus.   Respiratory:  Denies SOB, DOE, cough, chest tightness,  and wheezing.   Cardiovascular: Denies chest pain, palpitations and leg swelling.  Genitourinary: Denies dysuria, urgency, frequency, hematuria, flank pain and difficulty urinating.  Musculoskeletal: Denies myalgias, back pain, joint swelling, arthralgias and gait problem.  Skin: No rash.  Neurological: Denies dizziness, seizures, syncope, weakness, light-headedness, numbness and headaches.  Hematological: Denies adenopathy. Easy bruising, personal or family bleeding history  Psychiatric/Behavioral: No anxiety or depression     Physical Exam:    BP 110/62 (BP Location: Left Arm, Patient Position: Sitting, Cuff Size: Normal)   Pulse 73   Ht 5\' 3"  (1.6 m)   Wt 153 lb 4 oz (69.5 kg)   BMI 27.15 kg/m  Wt Readings from Last 3 Encounters:  08/21/20 153 lb 4 oz (69.5 kg)  07/23/20 146 lb 6.4 oz (66.4 kg)  07/15/20 147 lb 3.2 oz (66.8 kg)   Constitutional:  Well-developed, in no acute distress. Psychiatric: Normal mood and affect. Behavior is normal. HEENT: Pupils normal.  Conjunctivae are normal. No scleral icterus. Neck supple.  Cardiovascular: Normal rate, regular rhythm. No edema Pulmonary/chest: Effort normal and breath sounds normal. No wheezing, rales or rhonchi. Abdominal: Soft, nondistended. Nontender. Bowel sounds active throughout. There are no masses palpable. No hepatomegaly. Rectal: Deferred Neurological: Alert and oriented to person place and time. Skin: Skin is warm and dry. No rashes noted.  Data Reviewed: I have personally reviewed following labs and imaging studies  CBC: CBC Latest Ref Rng & Units 05/23/2020 03/11/2020 01/25/2020  WBC 3.4 - 10.8 x10E3/uL 4.0 3.9(L) 5.3  Hemoglobin 11.1 - 15.9 g/dL 11.5 11.3(L) 11.2  Hematocrit 34.0 - 46.6 % 34.2 34.1(L) 34.4  Platelets 150 - 450 x10E3/uL 237 238 248    CMP: CMP Latest Ref Rng & Units 05/23/2020 03/11/2020 08/03/2019  Glucose 65 - 99 mg/dL 87 87 72  BUN 6 - 24 mg/dL 16 15 16    Creatinine 0.57 - 1.00 mg/dL 0.87 0.77 0.85  Sodium 134 - 144 mmol/L 140 138 134  Potassium 3.5 - 5.2 mmol/L 4.5 4.4 3.6  Chloride 96 - 106 mmol/L 104 107 103  CO2 22 - 32 mmol/L - 22 22  Calcium 8.7 - 10.2 mg/dL 9.2 9.1 9.2  Total Protein 6.0 - 8.5 g/dL 6.9 - -  Total Bilirubin 0.0 - 1.2 mg/dL 0.5 - -  Alkaline Phos 44 - 121 IU/L 76 - -  AST 0 - 40 IU/L 19 - -  ALT 0 - 44 U/L - - -      Radiology Studies: US PELVIC COMPLETE WITH TRANSVAGINAL  Result Date: 08/05/2020 CLINICAL DATA:  Pelvic pain, history of fibroids, LMP 07/23/2019 EXAM: TRANSABDOMINAL AND TRANSVAGINAL ULTRASOUND OF PELVIS TECHNIQUE: Both transabdominal and transvaginal ultrasound examinations of the pelvis were performed. Transabdominal technique  was performed for global imaging of the pelvis including uterus, ovaries, adnexal regions, and pelvic cul-de-sac. It was necessary to proceed with endovaginal exam following the transabdominal exam to visualize the endometrium and RIGHT ovary. COMPARISON:  03/26/2016 FINDINGS: Uterus Measurements: 12.3 x 8.8 x 10.6 cm = volume: 603 mL. Enlarged and nodular containing multiple masses consistent with leiomyomata. Largest of these measure 7.4 cm greatest diameter at posterior upper RIGHT uterus submucosal, 3.8 cm diameter anterior upper uterus, and 3.5 cm diameter anterior RIGHT uterus. Endometrium Thickness: 2 mm. Displaced by the largest/posterior leiomyoma which extends submucosal. No endometrial fluid. Right ovary Not visualized, likely obscured by bowel Left ovary Measurements: 3.6 x 1.5 x 1.8 cm = volume: 5 mL. Normal morphology without mass Other findings No free pelvic fluid.  No adnexal masses. IMPRESSION: Nonvisualization of RIGHT ovary. Enlarged uterus containing multiple leiomyomata, largest 7.4 cm greatest diameter at posterior upper uterus which extends submucosal. Remainder of exam unremarkable. Electronically Signed   By: Lavonia Dana M.D.   On: 08/05/2020 12:18      Carmell Austria, MD 08/21/2020, 10:29 AM  Cc: Charlott Rakes, MD

## 2020-08-27 MED FILL — CETIRIZINE HCL 10 MG TABS: 10 | 30 days supply | Qty: 30 | Fill #3

## 2020-08-28 ENCOUNTER — Other Ambulatory Visit: Payer: Self-pay

## 2020-08-28 ENCOUNTER — Ambulatory Visit (INDEPENDENT_AMBULATORY_CARE_PROVIDER_SITE_OTHER): Payer: BC Managed Care – PPO | Admitting: Otolaryngology

## 2020-08-28 ENCOUNTER — Encounter (INDEPENDENT_AMBULATORY_CARE_PROVIDER_SITE_OTHER): Payer: Self-pay | Admitting: Otolaryngology

## 2020-08-28 VITALS — Temp 97.2°F

## 2020-08-28 DIAGNOSIS — J310 Chronic rhinitis: Secondary | ICD-10-CM

## 2020-08-28 DIAGNOSIS — R490 Dysphonia: Secondary | ICD-10-CM

## 2020-08-28 DIAGNOSIS — R42 Dizziness and giddiness: Secondary | ICD-10-CM | POA: Diagnosis not present

## 2020-08-28 DIAGNOSIS — K219 Gastro-esophageal reflux disease without esophagitis: Secondary | ICD-10-CM | POA: Diagnosis not present
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
## 2020-08-30 DIAGNOSIS — R42 Dizziness and giddiness: Secondary | ICD-10-CM | POA: Diagnosis not present

## 2020-09-02 ENCOUNTER — Telehealth: Payer: BC Managed Care – PPO | Admitting: Obstetrics and Gynecology

## 2020-09-06 ENCOUNTER — Other Ambulatory Visit: Payer: Self-pay

## 2020-09-06 ENCOUNTER — Encounter (INDEPENDENT_AMBULATORY_CARE_PROVIDER_SITE_OTHER): Payer: Self-pay | Admitting: Otolaryngology

## 2020-09-06 ENCOUNTER — Ambulatory Visit (INDEPENDENT_AMBULATORY_CARE_PROVIDER_SITE_OTHER): Payer: BC Managed Care – PPO | Admitting: Otolaryngology

## 2020-09-06 VITALS — Temp 97.7°F

## 2020-09-06 DIAGNOSIS — K219 Gastro-esophageal reflux disease without esophagitis: Secondary | ICD-10-CM

## 2020-09-06 DIAGNOSIS — J310 Chronic rhinitis: Secondary | ICD-10-CM
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
## 2020-09-09 ENCOUNTER — Telehealth (INDEPENDENT_AMBULATORY_CARE_PROVIDER_SITE_OTHER): Payer: BC Managed Care – PPO | Admitting: Obstetrics and Gynecology

## 2020-09-09 ENCOUNTER — Encounter: Payer: Self-pay | Admitting: Obstetrics and Gynecology

## 2020-09-09 DIAGNOSIS — D250 Submucous leiomyoma of uterus: Secondary | ICD-10-CM

## 2020-09-09 DIAGNOSIS — D252 Subserosal leiomyoma of uterus: Secondary | ICD-10-CM
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
## 2020-09-09 NOTE — Progress Notes (Signed)
4:15 pt. Not connected virtually, I called and asked if she is able to do her virtural visit. She states she forgot, but she can. Jareth Pardee,RN I connected with  Gail Moreno on 09/09/20 at 4:20  by [place] visit and verified that I am speaking with the correct person using two identifiers.   I discussed the limitations, risks, security and privacy concerns of performing an evaluation and management service by telephone and the availability of in person appointments. I also discussed with the patient that there may be a patient responsible charge related to this service. The patient expressed understanding and agreed to proceed.  Fishel Wamble,RN 09/09/2020  4:21 PM

## 2020-09-16 MED FILL — FERROUS SULFATE 325 MG TAB: 325 (65 FE) | 30 days supply | Qty: 60 | Fill #1

## 2020-09-27 ENCOUNTER — Encounter: Payer: Self-pay | Admitting: Gastroenterology

## 2020-10-01 ENCOUNTER — Encounter: Payer: Self-pay | Admitting: Gastroenterology

## 2020-10-01 ENCOUNTER — Other Ambulatory Visit: Payer: Self-pay

## 2020-10-01 ENCOUNTER — Ambulatory Visit (AMBULATORY_SURGERY_CENTER): Payer: BC Managed Care – PPO | Admitting: Gastroenterology

## 2020-10-01 VITALS — BP 116/68 | HR 63 | Temp 97.4°F | Resp 15 | Ht 63.0 in | Wt 153.0 lb

## 2020-10-01 DIAGNOSIS — Z8371 Family history of colonic polyps: Secondary | ICD-10-CM

## 2020-10-01 DIAGNOSIS — R197 Diarrhea, unspecified: Secondary | ICD-10-CM | POA: Diagnosis not present

## 2020-10-01 DIAGNOSIS — K219 Gastro-esophageal reflux disease without esophagitis: Secondary | ICD-10-CM

## 2020-10-01 DIAGNOSIS — K2980 Duodenitis without bleeding: Secondary | ICD-10-CM | POA: Diagnosis not present

## 2020-10-01 DIAGNOSIS — K2289 Other specified disease of esophagus: Secondary | ICD-10-CM | POA: Diagnosis not present

## 2020-10-01 DIAGNOSIS — Z1211 Encounter for screening for malignant neoplasm of colon: Secondary | ICD-10-CM | POA: Diagnosis not present

## 2020-10-01 MED ORDER — SODIUM CHLORIDE 0.9 % IV SOLN: 500.0000 mL | Freq: Once | INTRAVENOUS | Status: DC

## 2020-10-01 NOTE — Progress Notes (Signed)
Called to room to assist during endoscopic procedure.  Patient ID and intended procedure confirmed with present staff. Received instructions for my participation in the procedure from the performing physician.  

## 2020-10-01 NOTE — Patient Instructions (Signed)
Handouts provided on gastritis and hemorrhoids.   Continue present medications including Protonix 40mg  by mouth once a day.   Avoid NSAIDs including aspirin, ibuprofen, naproxen, or other non-steriodal anti-inflammatory drugs. Use Tylenol as needed for mild pain or fever.     YOU HAD AN ENDOSCOPIC PROCEDURE TODAY AT [facility]:   Refer to the procedure report that was given to you for any specific questions about what was found during the examination.  If the procedure report does not answer your questions, please call your gastroenterologist to clarify.  If you requested that your care partner not be given the details of your procedure findings, then the procedure report has been included in a sealed envelope for you to review at your convenience later.  YOU SHOULD EXPECT: Some feelings of bloating in the abdomen. Passage of more gas than usual.  Walking can help get rid of the air that was put into your GI tract during the procedure and reduce the bloating. If you had a lower endoscopy (such as a colonoscopy or flexible sigmoidoscopy) you may notice spotting of blood in your stool or on the toilet paper. If you underwent a bowel prep for your procedure, you may not have a normal bowel movement for a few days.  Please Note:  You might notice some irritation and congestion in your nose or some drainage.  This is from the oxygen used during your procedure.  There is no need for concern and it should clear up in a day or so.  SYMPTOMS TO REPORT IMMEDIATELY:   Following lower endoscopy (colonoscopy or flexible sigmoidoscopy):  Excessive amounts of blood in the stool  Significant tenderness or worsening of abdominal pains  Swelling of the abdomen that is new, acute  Fever of 100F or higher   Following upper endoscopy (EGD)  Vomiting of blood or coffee ground material  New chest pain or pain under the shoulder blades  Painful or persistently difficult swallowing  New  shortness of breath  Fever of 100F or higher  Black, tarry-looking stools  For urgent or emergent issues, a gastroenterologist can be reached at any hour by calling [phone]. Do not use MyChart messaging for urgent concerns.    DIET:  We do recommend a small meal at first, but then you may proceed to your regular diet.  Drink plenty of fluids but you should avoid alcoholic beverages for 24 hours.  ACTIVITY:  You should plan to take it easy for the rest of today and you should NOT DRIVE or use heavy machinery until tomorrow (because of the sedation medicines used during the test).    FOLLOW UP: Our staff will call the number listed on your records 48-72 hours following your procedure to check on you and address any questions or concerns that you may have regarding the information given to you following your procedure. If we do not reach you, we will leave a message.  We will attempt to reach you two times.  During this call, we will ask if you have developed any symptoms of COVID 19. If you develop any symptoms (ie: fever, flu-like symptoms, shortness of breath, cough etc.) before then, please call [phone].  If you test positive for Covid 19 in the 2 weeks post procedure, please call and report this information to [place].    If any biopsies were taken you will be contacted by phone or by letter within the next 1-3 weeks.  Please call us at [phone]  if you have not heard about the biopsies in 3 weeks.    SIGNATURES/CONFIDENTIALITY: You and/or your care partner have signed paperwork which will be entered into your electronic medical record.  These signatures attest to the fact that that the information above on your After Visit Summary has been reviewed and is understood.  Full responsibility of the confidentiality of this discharge information lies with you and/or your care-partner.

## 2020-10-01 NOTE — Op Note (Signed)
[place] [place] Patient Name: Gail Moreno Procedure Date: 10/01/2020 9:07 AM MRN: [id number] Endoscopist: Jackquline Denmark , MD Age: 50 Referring MD:  Date of Birth: 02-23-1971 Gender: Female Account #: [id number] Procedure:                Colonoscopy Indications:              Colon cancer screening in patient at increased                            risk: Family history of 1st-degree relative with                            colon polyps (mom and dad). History of intermittent                            diarrhea Medicines:                Monitored Anesthesia Care Procedure:                Pre-Anesthesia Assessment:                           - Prior to the procedure, a History and Physical                            was performed, and patient medications and                            allergies were reviewed. The patient's tolerance of                            previous anesthesia was also reviewed. The risks                            and benefits of the procedure and the sedation                            options and risks were discussed with the patient.                            All questions were answered, and informed consent                            was obtained. Prior Anticoagulants: The patient has                            taken no previous anticoagulant or antiplatelet                            agents. ASA Grade Assessment: II - A patient with                            mild systemic disease. After reviewing the risks  and benefits, the patient was deemed in                            satisfactory condition to undergo the procedure.                           After obtaining informed consent, the colonoscope                            was passed under direct vision. Throughout the                            procedure, the patient's blood pressure, pulse, and                            oxygen saturations were monitored continuously. The                             Olympus PFC-[id number] (#[id number]) Colonoscope was                            introduced through the anus and advanced to the 2                            cm into the ileum. The colonoscopy was performed                            without difficulty. The patient tolerated the                            procedure well. The quality of the bowel                            preparation was good. The terminal ileum, ileocecal                            valve, appendiceal orifice, and rectum were                            photographed. Scope In: 9:23:32 AM Scope Out: 9:35:09 AM Scope Withdrawal Time: 0 hours 9 minutes 6 seconds  Total Procedure Duration: 0 hours 11 minutes 37 seconds  Findings:                 The colon (entire examined portion) appeared                            normal. Biopsies for histology were taken with a                            cold forceps from the entire colon for evaluation                            of microscopic colitis.  Non-bleeding internal hemorrhoids were found during                            retroflexion. The hemorrhoids were small.                           The terminal ileum appeared normal.                           The exam was otherwise without abnormality on                            direct and retroflexion views. Complications:            No immediate complications. Estimated Blood Loss:     Estimated blood loss: none. Impression:               - The entire examined colon is normal. Biopsied.                           - Non-bleeding internal hemorrhoids.                           - The examined portion of the ileum was normal.                           - The examination was otherwise normal on direct                            and retroflexion views. Recommendation:           - Patient has a contact number available for                            emergencies. The signs and symptoms of potential                             delayed complications were discussed with the                            patient. Return to normal activities tomorrow.                            Written discharge instructions were provided to the                            patient.                           - Resume previous diet.                           - Continue present medications.                           - Await pathology results.                           -  Repeat colonoscopy in 5 years for screening                            purposes d/t strong family history of colonic                            polyps. Earlier in case of any new problems or                            change in family history.                           - Return to GI clinic PRN.                           - The findings and recommendations were discussed                            with the patient's family. Jackquline Denmark, MD 10/01/2020 9:42:25 AM This report has been signed electronically.

## 2020-10-01 NOTE — Progress Notes (Signed)
Vitals-CW  History reviewed. 

## 2020-10-01 NOTE — Progress Notes (Signed)
To PACU, VSS. Report to [email or web address] 

## 2020-10-01 NOTE — Op Note (Signed)
[place] Patient Name: Gail Moreno Procedure Date: 10/01/2020 9:08 AM MRN: [id number] Endoscopist: Jackquline Denmark , MD Age: 50 Referring MD:  Date of Birth: 1970-09-04 Gender: Female Account #: [id number] Procedure:                Upper GI endoscopy Indications:              heartburn, cough Medicines:                Monitored Anesthesia Care Procedure:                Pre-Anesthesia Assessment:                           - Prior to the procedure, a History and Physical                            was performed, and patient medications and                            allergies were reviewed. The patient's tolerance of                            previous anesthesia was also reviewed. The risks                            and benefits of the procedure and the sedation                            options and risks were discussed with the patient.                            All questions were answered, and informed consent                            was obtained. Prior Anticoagulants: The patient has                            taken no previous anticoagulant or antiplatelet                            agents. ASA Grade Assessment: II - A patient with                            mild systemic disease. After reviewing the risks                            and benefits, the patient was deemed in                            satisfactory condition to undergo the procedure.                           After obtaining informed consent, the endoscope was  passed under direct vision. Throughout the                            procedure, the patient's blood pressure, pulse, and                            oxygen saturations were monitored continuously. The                            Endoscope was introduced through the mouth, and                            advanced to the second part of duodenum. The upper                            GI endoscopy was accomplished without  difficulty.                            The patient tolerated the procedure well. Scope In: Scope Out: Findings:                 The examined esophagus was normal with well-defined                            Z-line at 36 cm. Biopsies were obtained from the                            proximal and distal esophagus with cold forceps for                            histology of suspected eosinophilic esophagitis.                           Localized minimal inflammation characterized by                            erythema was found in the gastric antrum. Biopsies                            were taken with a cold forceps for histology.                           Localized mild inflammation characterized by                            erythema was found in the duodenal bulb. Biopsies                            were taken with a cold forceps for histology. Complications:            No immediate complications. Estimated Blood Loss:     Estimated blood loss: none. Impression:               -Mild gastroduodenitis. Recommendation:           - Patient has  a contact number available for                            emergencies. The signs and symptoms of potential                            delayed complications were discussed with the                            patient. Return to normal activities tomorrow.                            Written discharge instructions were provided to the                            patient.                           - Resume previous diet.                           - Continue present medications including Protonix                            40 mg p.o. once a day.                           - Await pathology results.                           - Avoid nonsteroidals if possible.                           - The findings and recommendations were discussed                            with the patient's family.                           - If cough persists, would recommend ENT possibly                             followed by pulmonary consultation. Jackquline Denmark, MD 10/01/2020 9:39:17 AM This report has been signed electronically.

## 2020-10-03 ENCOUNTER — Telehealth: Payer: Self-pay

## 2020-10-03 NOTE — Telephone Encounter (Signed)
  Follow up Call-  Call back number 10/01/2020  Post procedure Call Back phone  # [phone]  Permission to leave phone message Yes  Some recent data might be hidden     Patient questions:  Do you have a fever, pain , or abdominal swelling? No. Pain Score  0 *  Have you tolerated food without any problems? Yes.    Have you been able to return to your normal activities? Yes.    Do you have any questions about your discharge instructions: Diet   No. Medications  No. Follow up visit  No.  Do you have questions or concerns about your Care? No.  Actions: * If pain score is 4 or above: No action needed, pain <4.  1. Have you developed a fever since your procedure? no  2.   Have you had an respiratory symptoms (SOB or cough) since your procedure? no  3.   Have you tested positive for COVID 19 since your procedure no  4.   Have you had any family members/close contacts diagnosed with the COVID 19 since your procedure?  no   If yes to any of these questions please route to Joylene John, RN and Joella Prince, RN

## 2020-10-13 ENCOUNTER — Encounter: Payer: Self-pay | Admitting: Gastroenterology

## 2020-10-21 ENCOUNTER — Encounter: Payer: BC Managed Care – PPO | Admitting: Gastroenterology

## 2021-05-06 ENCOUNTER — Ambulatory Visit: Payer: Self-pay | Admitting: *Deleted

## 2021-05-06 NOTE — Telephone Encounter (Signed)
Reason for Disposition  [1] MODERATE dizziness (e.g., vertigo; feels very unsteady, interferes with normal activities) AND [2] has been evaluated by physician for this  Answer Assessment - Initial Assessment Questions 1. DESCRIPTION: "Describe your dizziness."     spinning 2. VERTIGO: "Do you feel like either you or the room is spinning or tilting?"      yes 3. LIGHTHEADED: "Do you feel lightheaded?" (e.g., somewhat faint, woozy, weak upon standing)     no 4. SEVERITY: "How bad is it?"  "Can you walk?"   - MILD: Feels slightly dizzy and unsteady, but is walking normally.   - MODERATE: Feels unsteady when walking, but not falling; interferes with normal activities (e.g., school, work).   - SEVERE: Unable to walk without falling, or requires assistance to walk without falling.     mild 5. ONSET:  "When did the dizziness begin?"     Today- also had dizziness on/off 6. AGGRAVATING FACTORS: "Does anything make it worse?" (e.g., standing, change in head position)     unsure 7. CAUSE: "What do you think is causing the dizziness?"     unsure 8. RECURRENT SYMPTOM: "Have you had dizziness before?" If Yes, ask: "When was the last time?" "What happened that time?"     Yes- January- anemia, vit D- medication did not help 9. OTHER SYMPTOMS: "Do you have any other symptoms?" (e.g., headache, weakness, numbness, vomiting, earache)     Earache- uses headset 10. PREGNANCY: "Is there any chance you are pregnant?" "When was your last menstrual period?"       na  Protocols used: Dizziness - Vertigo-A-AH

## 2021-05-06 NOTE — Telephone Encounter (Signed)
Patient states she is getting vertigo- it comes and goes- did happen 3-4 times /hour only lasting for seconds and it goes away. Patient is not sure what is causing it- but can not relate it to anything like movement, diet,etc. Patient has stopped her reflux medication and her cough is back.

## 2021-05-08 ENCOUNTER — Ambulatory Visit: Payer: BC Managed Care – PPO | Attending: Family Medicine | Admitting: Family Medicine

## 2021-05-08 ENCOUNTER — Other Ambulatory Visit: Payer: Self-pay

## 2021-05-08 ENCOUNTER — Encounter: Payer: Self-pay | Admitting: Family Medicine

## 2021-05-08 VITALS — BP 114/80 | HR 83 | Ht 63.0 in | Wt 153.4 lb

## 2021-05-08 DIAGNOSIS — R42 Dizziness and giddiness: Secondary | ICD-10-CM | POA: Diagnosis not present

## 2021-05-08 DIAGNOSIS — Z1159 Encounter for screening for other viral diseases: Secondary | ICD-10-CM

## 2021-05-08 DIAGNOSIS — J310 Chronic rhinitis: Secondary | ICD-10-CM

## 2021-05-08 DIAGNOSIS — Z13228 Encounter for screening for other metabolic disorders: Secondary | ICD-10-CM

## 2021-05-08 MED ORDER — PREDNISONE 20 MG PO TABS: 20.0000 mg | ORAL_TABLET | Freq: Every day | ORAL | 0 refills | Status: DC

## 2021-05-08 NOTE — Progress Notes (Signed)
Discuss acid reflux.

## 2021-05-08 NOTE — Patient Instructions (Signed)
Vertigo Vertigo is the feeling that you or your surroundings are moving when they are not. This feeling can come and go at any time. Vertigo often goes away on its own. Vertigo can be dangerous if it occurs while you are doing something thatcould endanger yourself or others, such as driving or operating machinery. Your health care provider will do tests to try to determine the cause of your vertigo. Tests will also help your health care provider decide how best totreat your condition. Follow these instructions at home: Eating and drinking     Dehydration can make vertigo worse. Drink enough fluid to keep your urine pale yellow. Do not drink alcohol. Activity Return to your normal activities as told by your health care provider. Ask your health care provider what activities are safe for you. In the morning, first sit up on the side of the bed. When you feel okay, stand slowly while you hold onto something until you know that your balance is fine. Move slowly. Avoid sudden body or head movements or certain positions, as told by your health care provider. If you have trouble walking or keeping your balance, try using a cane for stability. If you feel dizzy or unstable, sit down right away. Avoid doing any tasks that would cause danger to you or others if vertigo occurs. Avoid bending down if you feel dizzy. Place items in your home so that they are easy for you to reach without bending or leaning over. Do not drive or use machinery if you feel dizzy. General instructions Take over-the-counter and prescription medicines only as told by your health care provider. Keep all follow-up visits. This is important. Contact a health care provider if: Your medicines do not relieve your vertigo or they make it worse. Your condition gets worse or you develop new symptoms. You have a fever. You develop nausea or vomiting, or if nausea gets worse. Your family or friends notice any behavioral changes. You  have numbness or a prickling and tingling sensation in part of your body. Get help right away if you: Are always dizzy or you faint. Develop severe headaches. Develop a stiff neck. Develop sensitivity to light. Have difficulty moving or speaking. Have weakness in your hands, arms, or legs. Have changes in your hearing or vision. These symptoms may represent a serious problem that is an emergency. Do not wait to see if the symptoms will go away. Get medical help right away. Call your local emergency services (911 in the [place] Do not drive yourself to the hospital. Summary Vertigo is the feeling that you or your surroundings are moving when they are not. Your health care provider will do tests to try to determine the cause of your vertigo. Follow instructions for home care. You may be told to avoid certain tasks, positions, or movements. Contact a health care provider if your medicines do not relieve your symptoms, or if you have a fever, nausea, vomiting, or changes in behavior. Get help right away if you have severe headaches or difficulty speaking, or you develop hearing or vision problems. This information is not intended to replace advice given to you by your health care provider. Make sure you discuss any questions you have with your healthcare provider. Document Revised: 05/08/2020 Document Reviewed: 05/08/2020 Elsevier Patient Education  2022 Elsevier Inc.  
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
## 2021-05-09 LAB — CBC WITH DIFFERENTIAL/PLATELET
Basophils Absolute: 0 10*3/uL (ref 0.0–0.2)
Basos: 0 %
EOS (ABSOLUTE): 0.1 10*3/uL (ref 0.0–0.4)
Eos: 1 %
Hematocrit: 35.1 % (ref 34.0–46.6)
Hemoglobin: 11.5 g/dL (ref 11.1–15.9)
Immature Grans (Abs): 0 10*3/uL (ref 0.0–0.1)
Immature Granulocytes: 0 %
Lymphocytes Absolute: 1.9 10*3/uL (ref 0.7–3.1)
Lymphs: 34 %
MCH: 28.7 pg (ref 26.6–33.0)
MCHC: 32.8 g/dL (ref 31.5–35.7)
MCV: 88 fL (ref 79–97)
Monocytes Absolute: 0.6 10*3/uL (ref 0.1–0.9)
Monocytes: 12 %
Neutrophils Absolute: 2.9 10*3/uL (ref 1.4–7.0)
Neutrophils: 53 %
Platelets: 243 10*3/uL (ref 150–450)
RBC: 4.01 x10E6/uL (ref 3.77–5.28)
RDW: 13.4 % (ref 11.7–15.4)
WBC: 5.4 10*3/uL (ref 3.4–10.8)

## 2021-05-09 LAB — BASIC METABOLIC PANEL
BUN/Creatinine Ratio: 13 (ref 9–23)
BUN: 11 mg/dL (ref 6–24)
CO2: 23 mmol/L (ref 20–29)
Calcium: 9.5 mg/dL (ref 8.7–10.2)
Chloride: 102 mmol/L (ref 96–106)
Creatinine, Ser: 0.84 mg/dL (ref 0.57–1.00)
Glucose: 95 mg/dL (ref 70–99)
Potassium: 4.2 mmol/L (ref 3.5–5.2)
Sodium: 137 mmol/L (ref 134–144)
eGFR: 85 mL/min/{1.73_m2} (ref >59)

## 2021-05-09 LAB — HCV AB W REFLEX TO QUANT PCR: HCV Ab: 0.3 s/co ratio (ref 0.0–0.9)

## 2021-05-09 LAB — HCV INTERPRETATION

## 2021-06-02 ENCOUNTER — Ambulatory Visit: Payer: BC Managed Care – PPO | Attending: Family Medicine | Admitting: Family Medicine

## 2021-06-02 ENCOUNTER — Other Ambulatory Visit: Payer: Self-pay

## 2021-06-02 ENCOUNTER — Encounter: Payer: Self-pay | Admitting: Family Medicine

## 2021-06-02 VITALS — BP 122/76 | HR 73 | Ht 63.0 in | Wt 154.8 lb

## 2021-06-02 DIAGNOSIS — Z0000 Encounter for general adult medical examination without abnormal findings: Secondary | ICD-10-CM

## 2021-06-02 DIAGNOSIS — Z1231 Encounter for screening mammogram for malignant neoplasm of breast: Secondary | ICD-10-CM

## 2021-06-02 DIAGNOSIS — Z23 Encounter for immunization: Secondary | ICD-10-CM | POA: Diagnosis not present

## 2021-06-02 DIAGNOSIS — Z13228 Encounter for screening for other metabolic disorders: Secondary | ICD-10-CM

## 2021-06-02 NOTE — Patient Instructions (Signed)
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
## 2021-06-03 ENCOUNTER — Other Ambulatory Visit: Payer: Self-pay

## 2021-06-03 ENCOUNTER — Encounter: Payer: Self-pay | Admitting: Physical Therapy

## 2021-06-03 ENCOUNTER — Ambulatory Visit: Payer: BC Managed Care – PPO | Attending: Family Medicine

## 2021-06-03 ENCOUNTER — Ambulatory Visit: Payer: BC Managed Care – PPO | Attending: Family Medicine | Admitting: Physical Therapy

## 2021-06-03 DIAGNOSIS — R293 Abnormal posture: Secondary | ICD-10-CM | POA: Insufficient documentation

## 2021-06-03 DIAGNOSIS — M542 Cervicalgia: Secondary | ICD-10-CM | POA: Diagnosis not present

## 2021-06-03 DIAGNOSIS — R42 Dizziness and giddiness: Secondary | ICD-10-CM | POA: Diagnosis not present

## 2021-06-03 DIAGNOSIS — Z1231 Encounter for screening mammogram for malignant neoplasm of breast: Secondary | ICD-10-CM

## 2021-06-03 DIAGNOSIS — Z13228 Encounter for screening for other metabolic disorders: Secondary | ICD-10-CM
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
## 2021-06-03 NOTE — Patient Instructions (Signed)
Access Code: [email or web address] URL: [email or web address] Date: 06/03/2021 Prepared by: Glenetta Hew  Exercises Seated Gaze Stabilization with Head Rotation - 3 x daily - 7 x weekly - 3 sets - 10 reps Seated Gaze Stabilization with Head Nod - 3 x daily - 7 x weekly - 3 sets - 10 reps

## 2021-06-04 LAB — LP+NON-HDL CHOLESTEROL
Cholesterol, Total: 187 mg/dL (ref 100–199)
HDL: 58 mg/dL (ref >39)
LDL Chol Calc (NIH): 122 mg/dL — ABNORMAL HIGH (ref 0–99)
Total Non-HDL-Chol (LDL+VLDL): 129 mg/dL (ref 0–129)
Triglycerides: 37 mg/dL (ref 0–149)
VLDL Cholesterol Cal: 7 mg/dL (ref 5–40)

## 2021-06-11 ENCOUNTER — Other Ambulatory Visit: Payer: Self-pay

## 2021-06-11 ENCOUNTER — Ambulatory Visit: Payer: BC Managed Care – PPO | Admitting: Physical Therapy

## 2021-06-11 ENCOUNTER — Encounter: Payer: Self-pay | Admitting: Physical Therapy

## 2021-06-11 DIAGNOSIS — R293 Abnormal posture: Secondary | ICD-10-CM | POA: Diagnosis not present

## 2021-06-11 DIAGNOSIS — M542 Cervicalgia: Secondary | ICD-10-CM

## 2021-06-11 DIAGNOSIS — R42 Dizziness and giddiness: Secondary | ICD-10-CM
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
## 2021-06-20 ENCOUNTER — Other Ambulatory Visit: Payer: Self-pay

## 2021-06-20 ENCOUNTER — Ambulatory Visit: Payer: BC Managed Care – PPO | Admitting: Physical Therapy

## 2021-06-20 DIAGNOSIS — M542 Cervicalgia: Secondary | ICD-10-CM

## 2021-06-20 DIAGNOSIS — R293 Abnormal posture: Secondary | ICD-10-CM

## 2021-06-20 DIAGNOSIS — R42 Dizziness and giddiness: Secondary | ICD-10-CM
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
## 2021-06-30 ENCOUNTER — Other Ambulatory Visit: Payer: Self-pay

## 2021-06-30 ENCOUNTER — Ambulatory Visit: Payer: BC Managed Care – PPO | Attending: Family Medicine | Admitting: Physical Therapy

## 2021-06-30 DIAGNOSIS — R293 Abnormal posture: Secondary | ICD-10-CM | POA: Insufficient documentation

## 2021-06-30 DIAGNOSIS — M542 Cervicalgia: Secondary | ICD-10-CM | POA: Insufficient documentation

## 2021-06-30 DIAGNOSIS — R42 Dizziness and giddiness: Secondary | ICD-10-CM | POA: Diagnosis not present
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
## 2021-07-04 ENCOUNTER — Ambulatory Visit
Admission: RE | Admit: 2021-07-04 | Discharge: 2021-07-04 | Disposition: A | Discharge status: Home / Self Care | Payer: BC Managed Care – PPO | Source: Ambulatory Visit | Attending: Family Medicine | Admitting: Family Medicine

## 2021-07-04 ENCOUNTER — Other Ambulatory Visit: Payer: Self-pay

## 2021-07-04 DIAGNOSIS — Z1231 Encounter for screening mammogram for malignant neoplasm of breast: Secondary | ICD-10-CM

## 2021-07-14 ENCOUNTER — Ambulatory Visit: Payer: BC Managed Care – PPO | Admitting: Physical Therapy

## 2021-07-15 ENCOUNTER — Encounter: Payer: BC Managed Care – PPO | Admitting: Physical Therapy

## 2021-07-21 ENCOUNTER — Telehealth: Payer: BC Managed Care – PPO | Admitting: Nurse Practitioner

## 2021-07-21 ENCOUNTER — Ambulatory Visit: Payer: Self-pay | Admitting: *Deleted

## 2021-07-21 DIAGNOSIS — J22 Unspecified acute lower respiratory infection: Secondary | ICD-10-CM | POA: Diagnosis not present

## 2021-07-21 DIAGNOSIS — J0110 Acute frontal sinusitis, unspecified: Secondary | ICD-10-CM

## 2021-07-21 MED ORDER — AMOXICILLIN-POT CLAVULANATE 875-125 MG PO TABS: 1.0000 | ORAL_TABLET | Freq: Two times a day (BID) | ORAL | 0 refills | Status: AC

## 2021-07-21 NOTE — Telephone Encounter (Signed)
Summary: congestion / diarrhea / rx request   The patient has experienced congestion since December   The patient has experienced a sore throat, drainage and stomach discomfort   The patient shares that the stomach discomfort has caused vomiting as well as occasional diarrhea   The patient would like something prescribed to help with their symptoms      Reason for Disposition  [1] Sinus congestion (pressure, fullness) AND [2] present > 10 days  Answer Assessment - Initial Assessment Questions 1. LOCATION: "Where does it hurt?"      No pain- sore throat 2. ONSET: "When did the sinus pain start?"  (e.g., hours, days)      December- worse in January 3. SEVERITY: "How bad is the pain?"   (Scale 1-10; mild, moderate or severe)   - MILD (1-3): doesn't interfere with normal activities    - MODERATE (4-7): interferes with normal activities (e.g., work or school) or awakens from sleep   - SEVERE (8-10): excruciating pain and patient unable to do any normal activities        No pain 4. RECURRENT SYMPTOM: "Have you ever had sinus problems before?" If Yes, ask: "When was the last time?" and "What happened that time?"      Seasonal allergy 5. NASAL CONGESTION: "Is the nose blocked?" If Yes, ask: "Can you open it or must you breathe through your mouth?"     both 6. NASAL DISCHARGE: "Do you have discharge from your nose?" If so ask, "What color?"     yellow 7. FEVER: "Do you have a fever?" If Yes, ask: "What is it, how was it measured, and when did it start?"      no 8. OTHER SYMPTOMS: "Do you have any other symptoms?" (e.g., sore throat, cough, earache, difficulty breathing)     Sore throat, cough 9. PREGNANCY: "Is there any chance you are pregnant?" "When was your last menstrual period?"  Protocols used: Sinus Pain or Congestion-A-AH

## 2021-07-21 NOTE — Progress Notes (Signed)
Virtual Visit Consent   Gail Moreno, you are scheduled for a virtual visit with a [facility] provider today.     Just as with appointments in the office, your consent must be obtained to participate.  Your consent will be active for this visit and any virtual visit you may have with one of our providers in the next 365 days.     If you have a MyChart account, a copy of this consent can be sent to you electronically.  All virtual visits are billed to your insurance company just like a traditional visit in the office.    As this is a virtual visit, video technology does not allow for your provider to perform a traditional examination.  This may limit your provider's ability to fully assess your condition.  If your provider identifies any concerns that need to be evaluated in person or the need to arrange testing (such as labs, EKG, etc.), we will make arrangements to do so.     Although advances in technology are sophisticated, we cannot ensure that it will always work on either your end or our end.  If the connection with a video visit is poor, the visit may have to be switched to a telephone visit.  With either a video or telephone visit, we are not always able to ensure that we have a secure connection.     I need to obtain your verbal consent now.   Are you willing to proceed with your visit today?    Gail Moreno has provided verbal consent on 07/21/2021 for a virtual visit (video or telephone).   Apolonio Schneiders, FNP   Date: 07/21/2021 5:05 PM   Virtual Visit via Video Note   I, Apolonio Schneiders, connected with  Gail Moreno  ([id number], 19-Oct-1970) on 07/21/21 at  5:00 PM EST by a video-enabled telemedicine application and verified that I am speaking with the correct person using two identifiers.  Location: Patient: Virtual Visit Location Patient: Home Provider: Virtual Visit Location Provider: Home Office   I discussed the limitations of evaluation and management by telemedicine  and the availability of in person appointments. The patient expressed understanding and agreed to proceed.    History of Present Illness: Gail Moreno is a 51 y.o. who identifies as a female who was assigned female at birth, and is being seen today with complaints of nasal congestion and a loss of voice. This has been going on for the past 16 days. Started after a birthday party she attended on 07/05/21  She has been coughing up mucous that is yellow and she has consistent nasal congestion as well   Denies any fevers  She has not been using anything over the counter for symptoms.   She denies a history of asthma, she has seen a pulmonologist and was diagnosed with GERD  She has not taken a COVID test during the course of this illness   She has had COVID twice in the past   Problems:  Patient Active Problem List   Diagnosis Date Noted   Abdominal pain 07/23/2020   Well woman exam with routine gynecological exam 01/26/2019   Exertional dyspnea 04/11/2018   Cough 04/11/2018   Menorrhagia with irregular cycle 03/20/2016   Fibroid uterus 03/20/2016   Iron deficiency anemia 03/20/2016    Allergies:  Allergies  Allergen Reactions   Dilaudid [Hydromorphone Hcl] Nausea And Vomiting   Meclizine Hcl Other (See Comments)    Caused confusion and  sluggishness   Singulair [Montelukast Sodium]    Medications:  Current Outpatient Medications  Medication Instructions   Ferrous Sulfate (IRON) 325 (65 Fe) MG TABS TAKE 1 TABLET BY MOUTH TWICE DAILY AFTER A MEAL   ferrous sulfate 325 (65 FE) MG tablet TAKE 1 TABLET BY MOUTH TWICE DAILY.   pantoprazole (PROTONIX) 40 mg, Oral, Daily   triamcinolone (NASACORT) 55 MCG/ACT AERO nasal inhaler 2 sprays, Nasal, Daily     Observations/Objective: Patient is well-developed, well-nourished in no acute distress.  Resting comfortably at home.  Head is normocephalic, atraumatic.  No labored breathing.  Speech is clear and coherent with logical content.   Patient is alert and oriented at baseline.    Assessment and Plan: 1. Acute lower respiratory tract infection Meds ordered this encounter  Medications   amoxicillin-clavulanate (AUGMENTIN) 875-125 MG tablet    Sig: Take 1 tablet by mouth 2 (two) times daily for 10 days.    Dispense:  20 tablet    Refill:  0     2. Acute non-recurrent frontal sinusitis Recommended OTC decongestant (Mucinex DM), Nasal spray Flonase or Nasacort. May restart OTC allergy medication as well (Zyrtec) tylenol for headache or pain     Antibiotics as above  Push fluids and rest   Follow Up Instructions: I discussed the assessment and treatment plan with the patient. The patient was provided an opportunity to ask questions and all were answered. The patient agreed with the plan and demonstrated an understanding of the instructions.  A copy of instructions were sent to the patient via MyChart unless otherwise noted below.    The patient was advised to call back or seek an in-person evaluation if the symptoms worsen or if the condition fails to improve as anticipated.  Time:  I spent 10 minutes with the patient via telehealth technology discussing the above problems/concerns.    Apolonio Schneiders, FNP

## 2021-07-21 NOTE — Telephone Encounter (Signed)
°  Chief Complaint: sinus congestion, sore throat, cough Symptoms: sore throat, congestion Frequency: since December Pertinent Negatives: Patient denies fever, SOB Disposition: [] ED /[] Urgent Care (no appt availability in office) / [] Appointment(In office/virtual)/ [x]  [facility]/ [] Home Care/ [] Refused Recommended Disposition /[] [facility]/ []  Follow-up with PCP Additional Notes:

## 2021-07-24 ENCOUNTER — Encounter (HOSPITAL_BASED_OUTPATIENT_CLINIC_OR_DEPARTMENT_OTHER): Payer: Self-pay | Admitting: *Deleted

## 2021-07-24 ENCOUNTER — Other Ambulatory Visit: Payer: Self-pay

## 2021-07-24 DIAGNOSIS — R110 Nausea: Secondary | ICD-10-CM | POA: Diagnosis not present

## 2021-07-24 DIAGNOSIS — Z79899 Other long term (current) drug therapy: Secondary | ICD-10-CM | POA: Diagnosis not present

## 2021-07-24 DIAGNOSIS — R42 Dizziness and giddiness: Secondary | ICD-10-CM | POA: Insufficient documentation

## 2021-07-24 NOTE — ED Triage Notes (Signed)
Dizziness and nausea for over 6 weeks. EKG at triage. She was diagnosed with vertigo.

## 2021-07-25 ENCOUNTER — Emergency Department (HOSPITAL_BASED_OUTPATIENT_CLINIC_OR_DEPARTMENT_OTHER): Payer: BC Managed Care – PPO

## 2021-07-25 ENCOUNTER — Encounter (HOSPITAL_BASED_OUTPATIENT_CLINIC_OR_DEPARTMENT_OTHER): Payer: Self-pay | Admitting: Emergency Medicine

## 2021-07-25 ENCOUNTER — Emergency Department (HOSPITAL_BASED_OUTPATIENT_CLINIC_OR_DEPARTMENT_OTHER)
Admission: EM | Admit: 2021-07-25 | Discharge: 2021-07-25 | Disposition: A | Discharge status: Home / Self Care | Payer: BC Managed Care – PPO | Attending: Emergency Medicine | Admitting: Emergency Medicine

## 2021-07-25 DIAGNOSIS — R42 Dizziness and giddiness: Secondary | ICD-10-CM

## 2021-07-25 DIAGNOSIS — R110 Nausea: Secondary | ICD-10-CM | POA: Diagnosis not present

## 2021-07-25 LAB — BASIC METABOLIC PANEL
Anion gap: 6 (ref 5–15)
BUN: 16 mg/dL (ref 6–20)
CO2: 23 mmol/L (ref 22–32)
Calcium: 8.7 mg/dL — ABNORMAL LOW (ref 8.9–10.3)
Chloride: 108 mmol/L (ref 98–111)
Creatinine, Ser: 0.8 mg/dL (ref 0.44–1.00)
GFR, Estimated: >60 mL/min (ref >60)
Glucose, Bld: 118 mg/dL — ABNORMAL HIGH (ref 70–99)
Potassium: 3.6 mmol/L (ref 3.5–5.1)
Sodium: 137 mmol/L (ref 135–145)

## 2021-07-25 LAB — CBC WITH DIFFERENTIAL/PLATELET
Abs Immature Granulocytes: 0.01 10*3/uL (ref 0.00–0.07)
Basophils Absolute: 0 10*3/uL (ref 0.0–0.1)
Basophils Relative: 0 %
Eosinophils Absolute: 0 10*3/uL (ref 0.0–0.5)
Eosinophils Relative: 1 %
HCT: 31 % — ABNORMAL LOW (ref 36.0–46.0)
Hemoglobin: 10.2 g/dL — ABNORMAL LOW (ref 12.0–15.0)
Immature Granulocytes: 0 %
Lymphocytes Relative: 20 %
Lymphs Abs: 0.9 10*3/uL (ref 0.7–4.0)
MCH: 28.1 pg (ref 26.0–34.0)
MCHC: 32.9 g/dL (ref 30.0–36.0)
MCV: 85.4 fL (ref 80.0–100.0)
Monocytes Absolute: 0.4 10*3/uL (ref 0.1–1.0)
Monocytes Relative: 8 %
Neutro Abs: 3.2 10*3/uL (ref 1.7–7.7)
Neutrophils Relative %: 71 %
Platelets: 269 10*3/uL (ref 150–400)
RBC: 3.63 MIL/uL — ABNORMAL LOW (ref 3.87–5.11)
RDW: 13.6 % (ref 11.5–15.5)
WBC: 4.5 10*3/uL (ref 4.0–10.5)
nRBC: 0 % (ref 0.0–0.2)

## 2021-07-25 LAB — PREGNANCY, URINE: Preg Test, Ur: NEGATIVE

## 2021-07-25 MED ORDER — ONDANSETRON 4 MG PO TBDP: ORAL_TABLET | ORAL | 0 refills | Status: DC

## 2021-07-25 MED ORDER — IOHEXOL 350 MG/ML SOLN
75.0000 mL | Freq: Once | INTRAVENOUS | Status: AC | PRN
  Administered 2021-07-25: 75 mL via INTRAVENOUS

## 2021-07-25 MED ORDER — ONDANSETRON HCL 4 MG/2ML IJ SOLN
4.0000 mg | Freq: Once | INTRAMUSCULAR | Status: AC
  Administered 2021-07-25: 4 mg via INTRAVENOUS
  Filled 2021-07-25: qty 2

## 2021-07-25 MED ORDER — SODIUM CHLORIDE 0.9 % IV BOLUS
500.0000 mL | Freq: Once | INTRAVENOUS | Status: AC
  Administered 2021-07-25: 500 mL via INTRAVENOUS

## 2021-07-25 NOTE — ED Notes (Signed)
Pt aware urine sample is needed 

## 2021-07-25 NOTE — ED Notes (Signed)
Pt A&Ox4 ambulatory at d/c with independent steady gait, NAD. Pt verbalized understanding of d/c instructions, prescription and follow up care.
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
## 2021-09-05 ENCOUNTER — Telehealth: Payer: Self-pay | Admitting: Family Medicine

## 2021-09-05 ENCOUNTER — Ambulatory Visit: Payer: Self-pay

## 2021-09-05 NOTE — Telephone Encounter (Signed)
Copied from [place] [id number]. Topic: Referral - Request for Referral >> Sep 05, 2021  3:15 PM Alanda Slim E wrote: Has patient seen PCP for this complaint? Yes  *If NO, is insurance requiring patient see PCP for this issue before PCP can refer them? Referral for which specialty: Pulmonologist  Preferred provider/office: [facility]  Reason for referral: Pt stated she went to one before that was in [place] but they moved and she is not sure of the name but she wants to go to the same provider / for her acid reflux

## 2021-09-05 NOTE — Telephone Encounter (Signed)
?  Chief Complaint: cold symptoms ?Symptoms: stuffy nose, cough, vomiting from GERD and sore throat ?Frequency: several weeks ongoing ?Pertinent Negatives: Patient denies fever  ?Disposition: '[]'$ ED /'[]'$ Urgent Care (no appt availability in office) / '[]'$ Appointment(In office/virtual)/ '[x]'$  [facility]/ '[]'$ Home Care/ '[]'$ Refused Recommended Disposition /'[]'$ [facility]/ '[]'$  Follow-up with PCP ?Additional Notes: advised pt of no appts with office and scheduled for virtual UC for pt preference.  ? ? ?Reason for Disposition ?? [1] Nasal discharge AND [2] present > 10 days ? ?Answer Assessment - Initial Assessment Questions ?1. ONSET: "When did the nasal discharge start?"  ?    Been several weeks ?2. AMOUNT: "How much discharge is there?"  ?    Mild to moderate ?3. COUGH: "Do you have a cough?" If yes, ask: "Describe the color of your sputum" (clear, white, yellow, green) ?    Yes a lot of mucus, clear thick  ?4. RESPIRATORY DISTRESS: "Describe your breathing."  ?    no ?5. FEVER: "Do you have a fever?" If Yes, ask: "What is your temperature, how was it measured, and when did it start?" ?    no ?6. SEVERITY: "Overall, how bad are you feeling right now?" (e.g., doesn't interfere with normal activities, staying home from school/work, staying in bed)  ?    ok ?7. OTHER SYMPTOMS: "Do you have any other symptoms?" (e.g., sore throat, earache, wheezing, vomiting) ?    Sore throat from GERD ? ?Protocols used: Common Cold-A-AH ? ?

## 2021-09-05 NOTE — Telephone Encounter (Signed)
Summary: cold symptoms  ? Pt has cold symptoms and asked for an appt / none available until May / pt also mentioned acid reflux issues/ please advise   ?  ?Called pt - LMOM to return call ?

## 2021-09-06 ENCOUNTER — Telehealth: Payer: BC Managed Care – PPO | Admitting: Nurse Practitioner

## 2021-09-06 DIAGNOSIS — K219 Gastro-esophageal reflux disease without esophagitis: Secondary | ICD-10-CM

## 2021-09-06 DIAGNOSIS — R053 Chronic cough: Secondary | ICD-10-CM | POA: Diagnosis not present

## 2021-09-06 MED ORDER — PANTOPRAZOLE SODIUM 40 MG PO TBEC: 40.0000 mg | DELAYED_RELEASE_TABLET | Freq: Every day | ORAL | 3 refills | Status: DC

## 2021-09-06 MED ORDER — BENZONATATE 100 MG PO CAPS: 100.0000 mg | ORAL_CAPSULE | Freq: Three times a day (TID) | ORAL | 0 refills | Status: DC | PRN

## 2021-09-06 MED ORDER — FAMOTIDINE 20 MG PO TABS: 20.0000 mg | ORAL_TABLET | Freq: Two times a day (BID) | ORAL | 0 refills | Status: DC

## 2021-09-06 NOTE — Progress Notes (Signed)
?Virtual Visit Consent  ? ?[name], you are scheduled for a virtual visit with a [facility] provider today.   ?  ?Just as with appointments in the office, your consent must be obtained to participate.  Your consent will be active for this visit and any virtual visit you may have with one of our providers in the next 365 days.   ?  ?If you have a MyChart account, a copy of this consent can be sent to you electronically.  All virtual visits are billed to your insurance company just like a traditional visit in the office.   ? ?As this is a virtual visit, video technology does not allow for your provider to perform a traditional examination.  This may limit your provider's ability to fully assess your condition.  If your provider identifies any concerns that need to be evaluated in person or the need to arrange testing (such as labs, EKG, etc.), we will make arrangements to do so.   ?  ?Although advances in technology are sophisticated, we cannot ensure that it will always work on either your end or our end.  If the connection with a video visit is poor, the visit may have to be switched to a telephone visit.  With either a video or telephone visit, we are not always able to ensure that we have a secure connection.    ? ?I need to obtain your verbal consent now.   Are you willing to proceed with your visit today?  ?  ?EMBERLI BALLESTER has provided verbal consent on 09/06/2021 for a virtual visit (video or telephone). ?  ?Apolonio Schneiders, FNP  ? ?Date: 09/06/2021 8:28 AM ? ? ?Virtual Visit via Video Note  ? ?IApolonio Schneiders, connected with  ARTHEA NOBEL  ([id number], 08/28/1970) on 09/06/21 at  8:30 AM EDT by a video-enabled telemedicine application and verified that I am speaking with the correct person using two identifiers. ? ?Location: ?Patient: Virtual Visit Location Patient: Home ?Provider: Virtual Visit Location Provider: Home Office ?  ?I discussed the limitations of evaluation and management by telemedicine  and the availability of in person appointments. The patient expressed understanding and agreed to proceed.   ? ?History of Present Illness: ?Gail Moreno is a 51 y.o. who identifies as a female who was assigned female at birth, and is being seen today with complaints of a cough that has been worsening over the past few days.  ? ?She does have a chronic cough from GERD and gets a cough from her reflux at times. She just restarted Protonix 5 days ago.  ? ?She also takes Zyrtec daily  ?Her cough has become productive and there is a mucous component when she coughs. Denies a fever or other systemic symptoms. Feels her GERD is worse when she is at work and hot. She eats popcorn before bed each night.  ? ?She does have some sinus congestion as well, recently started back on zyrtec for her allergies.  ? ?Problems:  ?Patient Active Problem List  ? Diagnosis Date Noted  ? Abdominal pain 07/23/2020  ? Well woman exam with routine gynecological exam 01/26/2019  ? Exertional dyspnea 04/11/2018  ? Cough 04/11/2018  ? Menorrhagia with irregular cycle 03/20/2016  ? Fibroid uterus 03/20/2016  ? Iron deficiency anemia 03/20/2016  ?  ?Allergies:  ?Allergies  ?Allergen Reactions  ? Dilaudid [Hydromorphone Hcl] Nausea And Vomiting  ? Meclizine Hcl Other (See Comments)  ?  Caused confusion and  sluggishness  ? Singulair [Montelukast Sodium]   ? ?Medications:  ?Current Outpatient Medications:  ?  Ferrous Sulfate (IRON) 325 (65 Fe) MG TABS, TAKE 1 TABLET BY MOUTH TWICE DAILY AFTER A MEAL, Disp: 60 tablet, Rfl: 0 ?  ferrous sulfate 325 (65 FE) MG tablet, TAKE 1 TABLET BY MOUTH TWICE DAILY., Disp: 60 tablet, Rfl: 3 ?  ondansetron (ZOFRAN-ODT) 4 MG disintegrating tablet, '4mg'$  ODT q8 hours prn nausea/vomit, Disp: 4 tablet, Rfl: 0 ?  pantoprazole (PROTONIX) 40 MG tablet, Take 1 tablet (40 mg total) by mouth daily., Disp: 30 tablet, Rfl: 11 ?  triamcinolone (NASACORT) 55 MCG/ACT AERO nasal inhaler, Place 2 sprays into the nose daily., Disp: 1  each, Rfl: 12 ? ?Observations/Objective: ?Patient is well-developed, well-nourished in no acute distress.  ?Resting comfortably at home.  ?Head is normocephalic, atraumatic.  ?No labored breathing.  ?Speech is clear and coherent with logical content.  ?Patient is alert and oriented at baseline.  ? ? ?Assessment and Plan: ?1. Chronic cough ?- benzonatate (TESSALON) 100 MG capsule; Take 1 capsule (100 mg total) by mouth 3 (three) times daily as needed for cough.  Dispense: 30 capsule; Refill: 0 ? ?2. Gastroesophageal reflux disease without esophagitis ? ?- pantoprazole (PROTONIX) 40 MG tablet; Take 1 tablet (40 mg total) by mouth daily.  Dispense: 30 tablet; Refill: 3 ?- famotidine (PEPCID) 20 MG tablet; Take 1 tablet (20 mg total) by mouth 2 (two) times daily. Take twice daily for the next week, then continue to use only as needed.  Dispense: 60 tablet; Refill: 0 ?   ? ? ?Follow Up Instructions: ?I discussed the assessment and treatment plan with the patient. The patient was provided an opportunity to ask questions and all were answered. The patient agreed with the plan and demonstrated an understanding of the instructions.  A copy of instructions were sent to the patient via MyChart unless otherwise noted below.  ? ?The patient was advised to call back or seek an in-person evaluation if the symptoms worsen or if the condition fails to improve as anticipated. ? ?Time:  ?I spent 15 minutes with the patient via telehealth technology discussing the above problems/concerns.   ? ?Apolonio Schneiders, FNP  ?

## 2021-09-08 NOTE — Telephone Encounter (Signed)
Patient already had the apt on 09/06/2021.  ?

## 2021-09-10 NOTE — Telephone Encounter (Signed)
Call placed to patient and VM was left informing patient to return phone call. 

## 2021-09-24 ENCOUNTER — Ambulatory Visit: Payer: BC Managed Care – PPO | Attending: Family Medicine | Admitting: Family Medicine

## 2021-09-24 ENCOUNTER — Encounter: Payer: Self-pay | Admitting: Family Medicine

## 2021-09-24 VITALS — BP 121/71 | HR 72 | Ht 63.0 in | Wt 153.0 lb

## 2021-09-24 DIAGNOSIS — R42 Dizziness and giddiness: Secondary | ICD-10-CM

## 2021-09-24 DIAGNOSIS — K219 Gastro-esophageal reflux disease without esophagitis: Secondary | ICD-10-CM | POA: Diagnosis not present

## 2021-09-24 DIAGNOSIS — R058 Other specified cough: Secondary | ICD-10-CM

## 2021-09-24 MED ORDER — MONTELUKAST SODIUM 10 MG PO TABS: 10.0000 mg | ORAL_TABLET | Freq: Every day | ORAL | 3 refills | Status: DC
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
## 2021-12-01 ENCOUNTER — Ambulatory Visit: Payer: BC Managed Care – PPO | Admitting: Family Medicine

## 2022-01-09 DIAGNOSIS — J189 Pneumonia, unspecified organism: Secondary | ICD-10-CM | POA: Diagnosis not present

## 2022-01-09 DIAGNOSIS — R059 Cough, unspecified: Secondary | ICD-10-CM | POA: Diagnosis not present

## 2022-01-09 DIAGNOSIS — R509 Fever, unspecified: Secondary | ICD-10-CM | POA: Diagnosis not present

## 2022-03-27 ENCOUNTER — Telehealth: Payer: Self-pay | Admitting: Family Medicine

## 2022-03-27 NOTE — Telephone Encounter (Signed)
Copied from [place] [id number]. Topic: General - Other >> Mar 26, 2022  9:50 AM Sabas Sous wrote: Reason for CRM: Pt wants to know if she needs to fast for her labs on Monday? Please advise   Best contact: [phone]

## 2022-03-27 NOTE — Telephone Encounter (Signed)
Pt was called and informed that she can be fasting for her appointment.

## 2022-03-30 ENCOUNTER — Encounter: Payer: Self-pay | Admitting: Family Medicine

## 2022-03-30 ENCOUNTER — Ambulatory Visit: Payer: BC Managed Care – PPO | Attending: Family Medicine | Admitting: Family Medicine

## 2022-03-30 VITALS — BP 116/76 | HR 87 | Temp 98.3°F | Ht 63.0 in | Wt 153.6 lb

## 2022-03-30 DIAGNOSIS — M549 Dorsalgia, unspecified: Secondary | ICD-10-CM | POA: Diagnosis not present

## 2022-03-30 DIAGNOSIS — Z23 Encounter for immunization: Secondary | ICD-10-CM

## 2022-03-30 DIAGNOSIS — Z136 Encounter for screening for cardiovascular disorders: Secondary | ICD-10-CM | POA: Diagnosis not present

## 2022-03-30 DIAGNOSIS — Z13228 Encounter for screening for other metabolic disorders: Secondary | ICD-10-CM

## 2022-03-30 DIAGNOSIS — K219 Gastro-esophageal reflux disease without esophagitis: Secondary | ICD-10-CM | POA: Diagnosis not present

## 2022-03-30 DIAGNOSIS — Z13220 Encounter for screening for lipoid disorders: Secondary | ICD-10-CM | POA: Diagnosis not present

## 2022-03-30 DIAGNOSIS — Z131 Encounter for screening for diabetes mellitus: Secondary | ICD-10-CM | POA: Diagnosis not present

## 2022-03-30 DIAGNOSIS — D219 Benign neoplasm of connective and other soft tissue, unspecified: Secondary | ICD-10-CM | POA: Diagnosis not present
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
## 2022-03-30 NOTE — Patient Instructions (Signed)
Muscle Cramps and Spasms Muscle cramps and spasms are when muscles tighten by themselves. They usually get better within minutes. Muscle cramps are painful. They are usually stronger and last longer than muscle spasms. Muscle spasms may or may not be painful. They can last a few seconds or much longer. Cramps and spasms can affect any muscle, but they occur most often in the calf muscles of the leg. They are usually not caused by a serious problem. In many cases, the cause is not known. Some common causes include: Doing more physical work or exercise than your body is ready for. Using the muscles too much (overuse) by repeating certain movements too many times. Staying in a certain position for a long time. Playing a sport or doing an activity without preparing properly. Using bad form or technique while playing a sport or doing an activity. Not having enough water in your body (dehydration). Injury. Side effects of some medicines. Low levels of the salts and minerals in your blood (electrolytes), such as low potassium or calcium. Follow these instructions at home: Managing pain and stiffness     Massage, stretch, and relax the muscle. Do this for many minutes at a time. If told, put heat on tight or tense muscles as often as told by your doctor. Use the heat source that your doctor recommends, such as a moist heat pack or a heating pad. Place a towel between your skin and the heat source. Leave the heat on for 20-30 minutes. Remove the heat if your skin turns bright red. This is very important if you are not able to feel pain, heat, or cold. You may have a greater risk of getting burned. If told, put ice on the affected area. This may help if you are sore or have pain after a cramp or spasm. Put ice in a plastic bag. Place a towel between your skin and the bag. Leave the ice on for 20 minutes, 2-3 times a day. Try taking hot showers or baths to help relax tight muscles. Eating and  drinking Drink enough fluid to keep your pee (urine) pale yellow. Eat a healthy diet to help ensure that your muscles work well. This should include: Fruits and vegetables. Lean protein. Whole grains. Low-fat or nonfat dairy products. General instructions If you are having cramps often, avoid intense exercise for several days. Take over-the-counter and prescription medicines only as told by your doctor. Watch for any changes in your symptoms. Keep all follow-up visits as told by your doctor. This is important. Contact a doctor if: Your cramps or spasms get worse or happen more often. Your cramps or spasms do not get better with time. Summary Muscle cramps and spasms are when muscles tighten by themselves. They usually get better within minutes. Cramps and spasms occur most often in the calf muscles of the leg. Massage, stretch, and relax the muscle. This may help the cramp or spasm go away. Drink enough fluid to keep your pee (urine) pale yellow. This information is not intended to replace advice given to you by your health care provider. Make sure you discuss any questions you have with your health care provider. Document Revised: 12/27/2020 Document Reviewed: 12/27/2020 Elsevier Patient Education  2023 [employer]  

## 2022-03-31 LAB — CBC WITH DIFFERENTIAL/PLATELET
Basophils Absolute: 0 10*3/uL (ref 0.0–0.2)
Basos: 1 %
EOS (ABSOLUTE): 0.1 10*3/uL (ref 0.0–0.4)
Eos: 3 %
Hematocrit: 27.4 % — ABNORMAL LOW (ref 34.0–46.6)
Hemoglobin: 8.4 g/dL — ABNORMAL LOW (ref 11.1–15.9)
Immature Grans (Abs): 0 10*3/uL (ref 0.0–0.1)
Immature Granulocytes: 0 %
Lymphocytes Absolute: 1.1 10*3/uL (ref 0.7–3.1)
Lymphs: 33 %
MCH: 24.2 pg — ABNORMAL LOW (ref 26.6–33.0)
MCHC: 30.7 g/dL — ABNORMAL LOW (ref 31.5–35.7)
MCV: 79 fL (ref 79–97)
Monocytes Absolute: 0.4 10*3/uL (ref 0.1–0.9)
Monocytes: 12 %
Neutrophils Absolute: 1.7 10*3/uL (ref 1.4–7.0)
Neutrophils: 51 %
Platelets: 374 10*3/uL (ref 150–450)
RBC: 3.47 x10E6/uL — ABNORMAL LOW (ref 3.77–5.28)
RDW: 15.4 % (ref 11.7–15.4)
WBC: 3.3 10*3/uL — ABNORMAL LOW (ref 3.4–10.8)

## 2022-03-31 LAB — CMP14+EGFR
ALT: 8 IU/L (ref 0–32)
AST: 15 IU/L (ref 0–40)
Albumin/Globulin Ratio: 1.5 (ref 1.2–2.2)
Albumin: 4.4 g/dL (ref 3.9–4.9)
Alkaline Phosphatase: 83 IU/L (ref 44–121)
BUN/Creatinine Ratio: 13 (ref 9–23)
BUN: 11 mg/dL (ref 6–24)
Bilirubin Total: <0.2 mg/dL (ref 0.0–1.2)
CO2: 20 mmol/L (ref 20–29)
Calcium: 9.2 mg/dL (ref 8.7–10.2)
Chloride: 103 mmol/L (ref 96–106)
Creatinine, Ser: 0.82 mg/dL (ref 0.57–1.00)
Globulin, Total: 3 g/dL (ref 1.5–4.5)
Glucose: 91 mg/dL (ref 70–99)
Potassium: 4.7 mmol/L (ref 3.5–5.2)
Sodium: 139 mmol/L (ref 134–144)
Total Protein: 7.4 g/dL (ref 6.0–8.5)
eGFR: 87 mL/min/{1.73_m2} (ref >59)

## 2022-03-31 LAB — LP+NON-HDL CHOLESTEROL
Cholesterol, Total: 166 mg/dL (ref 100–199)
HDL: 64 mg/dL (ref >39)
LDL Chol Calc (NIH): 90 mg/dL (ref 0–99)
Total Non-HDL-Chol (LDL+VLDL): 102 mg/dL (ref 0–129)
Triglycerides: 61 mg/dL (ref 0–149)
VLDL Cholesterol Cal: 12 mg/dL (ref 5–40)

## 2022-03-31 LAB — HEMOGLOBIN A1C
Est. average glucose Bld gHb Est-mCnc: 126 mg/dL
Hgb A1c MFr Bld: 6 % — ABNORMAL HIGH (ref 4.8–5.6)

## 2022-05-22 ENCOUNTER — Other Ambulatory Visit: Payer: Self-pay | Admitting: Family Medicine

## 2022-05-28 ENCOUNTER — Telehealth: Payer: Self-pay | Admitting: Emergency Medicine

## 2022-05-28 NOTE — Telephone Encounter (Signed)
Copied from [place] [id number]. Topic: General - Other >> May 28, 2022 10:14 AM Oley Balm E wrote: Reason for CRM: Pt wants to know if she can come in early like tomorrow for her 2nd dose of shingrex. Patient has a cold and works Monday (when she is scheduled) and thinks she should reschedule. Please call back and advise if she can come in tomorrow instead.   Best contact: [phone]

## 2022-05-29 DIAGNOSIS — J069 Acute upper respiratory infection, unspecified: Secondary | ICD-10-CM | POA: Diagnosis not present

## 2022-05-29 NOTE — Telephone Encounter (Signed)
Pt was called and she states that she will be in office on Monday for her vaccine.

## 2022-06-01 ENCOUNTER — Ambulatory Visit: Payer: BC Managed Care – PPO | Attending: Family Medicine | Admitting: Pharmacist

## 2022-06-01 DIAGNOSIS — Z23 Encounter for immunization: Secondary | ICD-10-CM | POA: Diagnosis not present

## 2022-06-01 NOTE — Progress Notes (Signed)
Patient presents for vaccination against zoster per orders of Dr. Margarita Rana. Consent given. Counseling provided. No contraindications exists. Vaccine administered without incident.   Benard Halsted, PharmD, [place], [place] [phone]

## 2022-06-05 DIAGNOSIS — B349 Viral infection, unspecified: Secondary | ICD-10-CM | POA: Diagnosis not present

## 2022-06-21 ENCOUNTER — Other Ambulatory Visit: Payer: Self-pay | Admitting: Nurse Practitioner

## 2022-06-21 DIAGNOSIS — K219 Gastro-esophageal reflux disease without esophagitis: Secondary | ICD-10-CM

## 2022-06-23 ENCOUNTER — Telehealth: Payer: Self-pay | Admitting: Family Medicine

## 2022-06-23 NOTE — Telephone Encounter (Signed)
Copied from [place] [phone]. Topic: General - Other >> Jun 19, 2022  4:46 PM [place] B wrote: Reason for CRM: Patient called in wanting to know what her blood type is. Please call back

## 2022-06-23 NOTE — Telephone Encounter (Signed)
Copied from [place] [id number]. Topic: General - Other >> Jun 19, 2022  4:46 PM [place] B wrote: Reason for CRM: Patient called in wanting to know what her blood type is. Please call back

## 2022-06-25 NOTE — Telephone Encounter (Signed)
Pt informed of PCP response.  

## 2022-06-25 NOTE — Telephone Encounter (Signed)
She would need to go donate blood to know her blood type. The only other time this is drawn is during Prenatal care or if one needs a transfusion

## 2022-06-25 NOTE — Telephone Encounter (Signed)
Pt is still waiting for result of Blood type, FU at [phone]

## 2022-06-25 NOTE — Telephone Encounter (Signed)
Routing to PCP for review.  How does patient go about this?

## 2022-07-17 ENCOUNTER — Other Ambulatory Visit: Payer: Self-pay | Admitting: Family Medicine

## 2022-07-17 DIAGNOSIS — K219 Gastro-esophageal reflux disease without esophagitis: Secondary | ICD-10-CM

## 2022-07-28 ENCOUNTER — Encounter: Payer: Self-pay | Admitting: Family Medicine

## 2022-07-28 ENCOUNTER — Ambulatory Visit: Payer: BC Managed Care – PPO | Attending: Family Medicine | Admitting: Family Medicine

## 2022-07-28 VITALS — BP 109/71 | HR 73 | Temp 98.0°F | Ht 63.0 in | Wt 152.2 lb

## 2022-07-28 DIAGNOSIS — R053 Chronic cough: Secondary | ICD-10-CM

## 2022-07-28 DIAGNOSIS — D509 Iron deficiency anemia, unspecified: Secondary | ICD-10-CM | POA: Diagnosis not present

## 2022-07-28 DIAGNOSIS — K219 Gastro-esophageal reflux disease without esophagitis: Secondary | ICD-10-CM

## 2022-07-28 MED ORDER — PANTOPRAZOLE SODIUM 40 MG PO TBEC: 40.0000 mg | DELAYED_RELEASE_TABLET | Freq: Every day | ORAL | 1 refills | Status: DC

## 2022-07-28 NOTE — Patient Instructions (Signed)
Food Choices for Gastroesophageal Reflux Disease, Adult When you have gastroesophageal reflux disease (GERD), the foods you eat and your eating habits are very important. Choosing the right foods can help ease the discomfort of GERD. Consider working with a dietitian to help you make healthy food choices. What are tips for following this plan? Reading food labels Look for foods that are low in saturated fat. Foods that have less than 5% of daily value (DV) of fat and 0 g of trans fats may help with your symptoms. Cooking Cook foods using methods other than frying. This may include baking, steaming, grilling, or broiling. These are all methods that do not need a lot of fat for cooking. To add flavor, try to use herbs that are low in spice and acidity. Meal planning  Choose healthy foods that are low in fat, such as fruits, vegetables, whole grains, low-fat dairy products, lean meats, fish, and poultry. Eat frequent, small meals instead of three large meals each day. Eat your meals slowly, in a relaxed setting. Avoid bending over or lying down until 2-3 hours after eating. Limit high-fat foods such as fatty meats or fried foods. Limit your intake of fatty foods, such as oils, butter, and shortening. Avoid the following as told by your health care provider: Foods that cause symptoms. These may be different for different people. Keep a food diary to keep track of foods that cause symptoms. Alcohol. Drinking large amounts of liquid with meals. Eating meals during the 2-3 hours before bed. Lifestyle Maintain a healthy weight. Ask your health care provider what weight is healthy for you. If you need to lose weight, work with your health care provider to do so safely. Exercise for at least 30 minutes on 5 or more days each week, or as told by your health care provider. Avoid wearing clothes that fit tightly around your waist and chest. Do not use any products that contain nicotine or tobacco. These  products include cigarettes, chewing tobacco, and vaping devices, such as e-cigarettes. If you need help quitting, ask your health care provider. Sleep with the head of your bed raised. Use a wedge under the mattress or blocks under the bed frame to raise the head of the bed. Chew sugar-free gum after mealtimes. What foods should I eat?  Eat a healthy, well-balanced diet of fruits, vegetables, whole grains, low-fat dairy products, lean meats, fish, and poultry. Each person is different. Foods that may trigger symptoms in one person may not trigger any symptoms in another person. Work with your health care provider to identify foods that are safe for you. The items listed above may not be a complete list of recommended foods and beverages. Contact a dietitian for more information. What foods should I avoid? Limiting some of these foods may help manage the symptoms of GERD. Everyone is different. Consult a dietitian or your health care provider to help you identify the exact foods to avoid, if any. Fruits Any fruits prepared with added fat. Any fruits that cause symptoms. For some people this may include citrus fruits, such as oranges, grapefruit, pineapple, and lemons. Vegetables Deep-fried vegetables. French fries. Any vegetables prepared with added fat. Any vegetables that cause symptoms. For some people, this may include tomatoes and tomato products, chili peppers, onions and garlic, and horseradish. Grains Pastries or quick breads with added fat. Meats and other proteins High-fat meats, such as fatty beef or pork, hot dogs, ribs, ham, sausage, salami, and bacon. Fried meat or protein, including   fried fish and fried chicken. Nuts and nut butters, in large amounts. Dairy Whole milk and chocolate milk. Sour cream. Cream. Ice cream. Cream cheese. Milkshakes. Fats and oils Butter. Margarine. Shortening. Ghee. Beverages Coffee and tea, with or without caffeine. Carbonated beverages. Sodas. Energy  drinks. Fruit juice made with acidic fruits, such as orange or grapefruit. Tomato juice. Alcoholic drinks. Sweets and desserts Chocolate and cocoa. Donuts. Seasonings and condiments Pepper. Peppermint and spearmint. Added salt. Any condiments, herbs, or seasonings that cause symptoms. For some people, this may include curry, hot sauce, or vinegar-based salad dressings. The items listed above may not be a complete list of foods and beverages to avoid. Contact a dietitian for more information. Questions to ask your health care provider Diet and lifestyle changes are usually the first steps that are taken to manage symptoms of GERD. If diet and lifestyle changes do not improve your symptoms, talk with your health care provider about taking medicines. Where to find more information International Foundation for Gastrointestinal Disorders: aboutgerd.org Summary When you have gastroesophageal reflux disease (GERD), food and lifestyle choices may be very helpful in easing the discomfort of GERD. Eat frequent, small meals instead of three large meals each day. Eat your meals slowly, in a relaxed setting. Avoid bending over or lying down until 2-3 hours after eating. Limit high-fat foods such as fatty meats or fried foods. This information is not intended to replace advice given to you by your health care provider. Make sure you discuss any questions you have with your health care provider. Document Revised: 12/18/2019 Document Reviewed: 12/18/2019 Elsevier Patient Education  2023 Elsevier Inc.  
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
## 2022-07-29 ENCOUNTER — Other Ambulatory Visit: Payer: Self-pay | Admitting: Family Medicine

## 2022-07-29 DIAGNOSIS — D509 Iron deficiency anemia, unspecified: Secondary | ICD-10-CM

## 2022-07-29 LAB — CBC WITH DIFFERENTIAL/PLATELET
Basophils Absolute: 0 10*3/uL (ref 0.0–0.2)
Basos: 1 %
EOS (ABSOLUTE): 0.1 10*3/uL (ref 0.0–0.4)
Eos: 2 %
Hematocrit: 25.1 % — ABNORMAL LOW (ref 34.0–46.6)
Hemoglobin: 7.5 g/dL — ABNORMAL LOW (ref 11.1–15.9)
Immature Grans (Abs): 0 10*3/uL (ref 0.0–0.1)
Immature Granulocytes: 0 %
Lymphocytes Absolute: 1.3 10*3/uL (ref 0.7–3.1)
Lymphs: 34 %
MCH: 21.1 pg — ABNORMAL LOW (ref 26.6–33.0)
MCHC: 29.9 g/dL — ABNORMAL LOW (ref 31.5–35.7)
MCV: 71 fL — ABNORMAL LOW (ref 79–97)
Monocytes Absolute: 0.6 10*3/uL (ref 0.1–0.9)
Monocytes: 15 %
Neutrophils Absolute: 1.9 10*3/uL (ref 1.4–7.0)
Neutrophils: 48 %
Platelets: 355 10*3/uL (ref 150–450)
RBC: 3.55 x10E6/uL — ABNORMAL LOW (ref 3.77–5.28)
RDW: 17.3 % — ABNORMAL HIGH (ref 11.7–15.4)
WBC: 3.8 10*3/uL (ref 3.4–10.8)

## 2022-08-04 ENCOUNTER — Other Ambulatory Visit: Payer: Self-pay | Admitting: Family

## 2022-08-04 DIAGNOSIS — D649 Anemia, unspecified: Secondary | ICD-10-CM

## 2022-08-05 ENCOUNTER — Inpatient Hospital Stay: Payer: BC Managed Care – PPO | Admitting: Family

## 2022-08-05 ENCOUNTER — Inpatient Hospital Stay: Payer: BC Managed Care – PPO | Attending: Hematology & Oncology

## 2022-08-05 ENCOUNTER — Inpatient Hospital Stay: Payer: BC Managed Care – PPO | Admitting: Licensed Clinical Social Worker

## 2022-08-05 ENCOUNTER — Encounter: Payer: Self-pay | Admitting: Family

## 2022-08-05 VITALS — BP 111/73 | HR 69 | Temp 98.5°F | Resp 17 | Wt 151.0 lb

## 2022-08-05 DIAGNOSIS — N920 Excessive and frequent menstruation with regular cycle: Secondary | ICD-10-CM | POA: Diagnosis not present

## 2022-08-05 DIAGNOSIS — D649 Anemia, unspecified: Secondary | ICD-10-CM

## 2022-08-05 DIAGNOSIS — D500 Iron deficiency anemia secondary to blood loss (chronic): Secondary | ICD-10-CM | POA: Diagnosis not present

## 2022-08-05 LAB — CMP (CANCER CENTER ONLY)
ALT: 6 U/L (ref 0–44)
AST: 11 U/L — ABNORMAL LOW (ref 15–41)
Albumin: 4.2 g/dL (ref 3.5–5.0)
Alkaline Phosphatase: 53 U/L (ref 38–126)
Anion gap: 8 (ref 5–15)
BUN: 18 mg/dL (ref 6–20)
CO2: 22 mmol/L (ref 22–32)
Calcium: 8.8 mg/dL — ABNORMAL LOW (ref 8.9–10.3)
Chloride: 105 mmol/L (ref 98–111)
Creatinine: 0.74 mg/dL (ref 0.44–1.00)
GFR, Estimated: >60 mL/min (ref >60)
Glucose, Bld: 101 mg/dL — ABNORMAL HIGH (ref 70–99)
Potassium: 3.7 mmol/L (ref 3.5–5.1)
Sodium: 135 mmol/L (ref 135–145)
Total Bilirubin: 0.2 mg/dL — ABNORMAL LOW (ref 0.3–1.2)
Total Protein: 7.5 g/dL (ref 6.5–8.1)

## 2022-08-05 LAB — CBC WITH DIFFERENTIAL (CANCER CENTER ONLY)
Abs Immature Granulocytes: 0.01 10*3/uL (ref 0.00–0.07)
Basophils Absolute: 0 10*3/uL (ref 0.0–0.1)
Basophils Relative: 1 %
Eosinophils Absolute: 0.1 10*3/uL (ref 0.0–0.5)
Eosinophils Relative: 2 %
HCT: 25.7 % — ABNORMAL LOW (ref 36.0–46.0)
Hemoglobin: 7.6 g/dL — ABNORMAL LOW (ref 12.0–15.0)
Immature Granulocytes: 0 %
Lymphocytes Relative: 31 %
Lymphs Abs: 1 10*3/uL (ref 0.7–4.0)
MCH: 20.9 pg — ABNORMAL LOW (ref 26.0–34.0)
MCHC: 29.6 g/dL — ABNORMAL LOW (ref 30.0–36.0)
MCV: 70.8 fL — ABNORMAL LOW (ref 80.0–100.0)
Monocytes Absolute: 0.4 10*3/uL (ref 0.1–1.0)
Monocytes Relative: 12 %
Neutro Abs: 1.8 10*3/uL (ref 1.7–7.7)
Neutrophils Relative %: 54 %
Platelet Count: 298 10*3/uL (ref 150–400)
RBC: 3.63 MIL/uL — ABNORMAL LOW (ref 3.87–5.11)
RDW: 18.3 % — ABNORMAL HIGH (ref 11.5–15.5)
WBC Count: 3.3 10*3/uL — ABNORMAL LOW (ref 4.0–10.5)
nRBC: 0 % (ref 0.0–0.2)

## 2022-08-05 LAB — RETICULOCYTES
Immature Retic Fract: 13.1 % (ref 2.3–15.9)
RBC.: 3.55 MIL/uL — ABNORMAL LOW (ref 3.87–5.11)
Retic Count, Absolute: 25.9 10*3/uL (ref 19.0–186.0)
Retic Ct Pct: 0.7 % (ref 0.4–3.1)

## 2022-08-05 LAB — LACTATE DEHYDROGENASE: LDH: 129 U/L (ref 98–192)

## 2022-08-05 LAB — SAVE SMEAR(SSMR), FOR PROVIDER SLIDE REVIEW

## 2022-08-05 LAB — FERRITIN: Ferritin: 4 ng/mL — ABNORMAL LOW (ref 11–307)

## 2022-08-05 MED ORDER — FOLIC ACID 1 MG PO TABS: 1.0000 mg | ORAL_TABLET | Freq: Every day | ORAL | 11 refills | Status: DC

## 2022-08-05 NOTE — Progress Notes (Signed)
[facility]  Clinical Social Work was referred by medical provider for assessment of psychosocial needs.  Clinical Social Worker met with patient  to offer support and assess for needs.    Patient requested information on receiving reimbursement for caring for her mother for a few months last year.  Unfortunately, her mother does not meet the qualifications to receive [place] through [facility].  Elveria stated she understood.  CSW provided [employer] information for medical bills and local resources.  No other needs expressed.    Margaree Mackintosh, LCSW  Clinical Social Worker [facility]
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
## 2022-08-06 LAB — IRON AND IRON BINDING CAPACITY (CC-WL,HP ONLY)
Iron: 12 ug/dL — ABNORMAL LOW (ref 28–170)
Saturation Ratios: 3 % — ABNORMAL LOW (ref 10.4–31.8)
TIBC: 475 ug/dL — ABNORMAL HIGH (ref 250–450)
UIBC: 463 ug/dL — ABNORMAL HIGH (ref 148–442)

## 2022-08-06 LAB — ERYTHROPOIETIN: Erythropoietin: 83.8 m[IU]/mL — ABNORMAL HIGH (ref 2.6–18.5)

## 2022-08-10 ENCOUNTER — Other Ambulatory Visit: Payer: Self-pay | Admitting: Family

## 2022-08-10 LAB — HGB FRACTIONATION CASCADE: Hgb A2: 1 % — ABNORMAL LOW (ref 1.8–3.2)

## 2022-08-10 LAB — HGB FRACTIONATION BY HPLC
Hgb A: 98.1 % (ref 96.4–98.8)
Hgb C: 0 %
Hgb E: 0 %
Hgb F: 0 % (ref 0.0–2.0)
Hgb S: 0 %
Hgb Variant: 0.9 % — ABNORMAL HIGH

## 2022-08-19 LAB — ALPHA-THALASSEMIA GENOTYPR

## 2022-08-21 ENCOUNTER — Telehealth: Payer: Self-pay | Admitting: Family

## 2022-08-21 NOTE — Telephone Encounter (Signed)
I was able to review recent Hgb fractionation cascade and alpha thalassemia genotype with Dr. Marin Olp. She is noted to have beta thalassemia minor and has started folic acid PO daily. Patient updated. No questions or concerns. Patient appreciative of call.

## 2022-08-26 ENCOUNTER — Inpatient Hospital Stay: Payer: BC Managed Care – PPO | Attending: Hematology & Oncology

## 2022-08-26 VITALS — BP 119/59 | HR 71 | Temp 98.3°F | Resp 18

## 2022-08-26 DIAGNOSIS — D500 Iron deficiency anemia secondary to blood loss (chronic): Secondary | ICD-10-CM | POA: Insufficient documentation

## 2022-08-26 DIAGNOSIS — N920 Excessive and frequent menstruation with regular cycle: Secondary | ICD-10-CM | POA: Insufficient documentation

## 2022-08-26 DIAGNOSIS — N921 Excessive and frequent menstruation with irregular cycle: Secondary | ICD-10-CM

## 2022-08-26 MED ORDER — SODIUM CHLORIDE 0.9 % IV SOLN
300.0000 mg | Freq: Once | INTRAVENOUS | Status: AC
  Administered 2022-08-26: 300 mg via INTRAVENOUS
  Filled 2022-08-26: qty 300

## 2022-08-26 MED ORDER — SODIUM CHLORIDE 0.9 % IV SOLN: Freq: Once | INTRAVENOUS | Status: AC

## 2022-08-26 NOTE — Patient Instructions (Signed)

## 2022-09-04 ENCOUNTER — Inpatient Hospital Stay: Payer: BC Managed Care – PPO

## 2022-09-04 VITALS — BP 112/65 | HR 69 | Temp 97.9°F | Resp 18

## 2022-09-04 DIAGNOSIS — N921 Excessive and frequent menstruation with irregular cycle: Secondary | ICD-10-CM

## 2022-09-04 DIAGNOSIS — N920 Excessive and frequent menstruation with regular cycle: Secondary | ICD-10-CM | POA: Diagnosis not present

## 2022-09-04 DIAGNOSIS — D500 Iron deficiency anemia secondary to blood loss (chronic): Secondary | ICD-10-CM | POA: Diagnosis not present

## 2022-09-04 MED ORDER — SODIUM CHLORIDE 0.9 % IV SOLN: Freq: Once | INTRAVENOUS | Status: AC

## 2022-09-04 MED ORDER — SODIUM CHLORIDE 0.9 % IV SOLN
300.0000 mg | Freq: Once | INTRAVENOUS | Status: AC
  Administered 2022-09-04: 300 mg via INTRAVENOUS
  Filled 2022-09-04: qty 300

## 2022-09-04 NOTE — Patient Instructions (Signed)

## 2022-09-14 ENCOUNTER — Inpatient Hospital Stay: Payer: BC Managed Care – PPO

## 2022-09-14 VITALS — BP 127/67 | HR 64 | Temp 98.2°F | Resp 18

## 2022-09-14 DIAGNOSIS — N921 Excessive and frequent menstruation with irregular cycle: Secondary | ICD-10-CM

## 2022-09-14 DIAGNOSIS — D500 Iron deficiency anemia secondary to blood loss (chronic): Secondary | ICD-10-CM

## 2022-09-14 DIAGNOSIS — N920 Excessive and frequent menstruation with regular cycle: Secondary | ICD-10-CM | POA: Diagnosis not present

## 2022-09-14 MED ORDER — SODIUM CHLORIDE 0.9 % IV SOLN
300.0000 mg | Freq: Once | INTRAVENOUS | Status: AC
  Administered 2022-09-14: 300 mg via INTRAVENOUS
  Filled 2022-09-14: qty 300

## 2022-09-14 MED ORDER — SODIUM CHLORIDE 0.9 % IV SOLN: Freq: Once | INTRAVENOUS | Status: AC

## 2022-09-14 NOTE — Patient Instructions (Signed)

## 2022-09-23 ENCOUNTER — Inpatient Hospital Stay: Payer: BC Managed Care – PPO | Admitting: Family

## 2022-09-23 ENCOUNTER — Inpatient Hospital Stay: Payer: BC Managed Care – PPO

## 2022-09-24 ENCOUNTER — Ambulatory Visit: Payer: Self-pay

## 2022-09-24 ENCOUNTER — Telehealth: Payer: Self-pay

## 2022-09-24 DIAGNOSIS — B353 Tinea pedis: Secondary | ICD-10-CM

## 2022-09-24 NOTE — Telephone Encounter (Signed)
Copied from [place] [id number]. Topic: Referral - Request for Referral >> Sep 24, 2022  3:21 PM Sabas Sous wrote: Has patient seen PCP for this complaint? No *If NO, is insurance requiring patient see PCP for this issue before PCP can refer them? Referral for which specialty: Podiatry  Preferred provider/office: [facility] [phone] Reason for referral: Athletes foot

## 2022-09-24 NOTE — Telephone Encounter (Signed)
  Chief Complaint: 2 knots on wrist, Athletes foot, Acid reflux Symptoms: above Frequency: Knots have been there over 1 year.  Pertinent Negatives: Patient denies fever Disposition: [] ED /[] Urgent Care (no appt availability in office) / [x] Appointment(In office/virtual)/ []   Virtual Care/ [] Home Care/ [] Refused Recommended Disposition /[]  Mobile Bus/ []  Follow-up with PCP Additional Notes: PT called for appt. Pt has 2 knots on her wrist that have been there for over 1 year. Pt thinks she may have hit her wrist at work. Pt states that it hurts sometimes.  PT also thinks she has athlete's foot, and acid reflux. No appts available in the 2 week window advised by protocol. Made earliest appt availble.  Please advise.    Summary: Swelling, possible atheletes foot.   Pt called back reporting that she has swelling on her left wrist, two bumps. Also thinks she has athletes foot traveling to her toenail. (Left foot)  ----- Message from Erick Blinks sent at 09/24/2022  3:23 PM EDT ----- Pt called back reporting that she has swelling on her left wrist, two bumps. Also thinks she has athletes foot traveling to her toenail. (Left foot)         Reason for Disposition  Small firm or hard BALL OR LUMP  Answer Assessment - Initial Assessment Questions 1. ONSET: "When did the swelling start?" (e.g., minutes, hours, days, weeks)     Last year 2. LOCATION: "What part of the wrist is swollen?"  "Are both wrists swollen or just one wrist?"     Left wrist 3. SEVERITY: "How bad is the swelling?"    * NONE: No joint swelling.   * SKIN ONLY: Localized; small area of puffy or swollen skin.   * BALL OR LUMP: There is a small firm ball or lump; size of a pea, marble, or grape.   * MILD: Joint looks or feels mildly swollen or puffy.   * MODERATE: Swollen; interferes with normal activities (e.g., work or school); decreased range of movement.   * SEVERE: Very swollen; can't move swollen  joint at all; unable to hold a cup of water.     Ball/lump 4. RECURRENT SYMPTOM: "Have you had wrist swelling before?" If Yes, ask: "When was the last time?" "What happened that time?"     Been over 1 year 5. CAUSE: "What do you think is causing the wrist swelling?" (e.g., arthritis, ganglion cyst, insect bite, recent injury)     Hurt at work, bumped at work.  6. OTHER SYMPTOMS: "Do you have any other symptoms?" (e.g., fever, hand pain)     no  Protocols used: Wrist Swelling-A-AH

## 2022-09-24 NOTE — Telephone Encounter (Signed)
Noted  

## 2022-09-25 NOTE — Telephone Encounter (Signed)
Patient has up coming appointment with you as well

## 2022-09-28 NOTE — Addendum Note (Signed)
Addended by: Hoy Register on: 09/28/2022 05:13 PM   Modules accepted: Orders

## 2022-09-28 NOTE — Telephone Encounter (Signed)
Referral has been placed. 

## 2022-09-29 ENCOUNTER — Encounter: Payer: Self-pay | Admitting: Family

## 2022-09-29 ENCOUNTER — Inpatient Hospital Stay: Payer: BC Managed Care – PPO | Attending: Hematology & Oncology

## 2022-09-29 ENCOUNTER — Inpatient Hospital Stay: Payer: BC Managed Care – PPO | Admitting: Family

## 2022-09-29 ENCOUNTER — Telehealth: Payer: Self-pay | Admitting: Family Medicine

## 2022-09-29 VITALS — BP 133/74 | HR 75 | Temp 98.2°F | Resp 18 | Wt 154.8 lb

## 2022-09-29 DIAGNOSIS — N920 Excessive and frequent menstruation with regular cycle: Secondary | ICD-10-CM | POA: Diagnosis not present

## 2022-09-29 DIAGNOSIS — D563 Thalassemia minor: Secondary | ICD-10-CM | POA: Insufficient documentation

## 2022-09-29 DIAGNOSIS — D500 Iron deficiency anemia secondary to blood loss (chronic): Secondary | ICD-10-CM

## 2022-09-29 DIAGNOSIS — D649 Anemia, unspecified: Secondary | ICD-10-CM

## 2022-09-29 LAB — CBC WITH DIFFERENTIAL (CANCER CENTER ONLY)
Abs Immature Granulocytes: 0 10*3/uL (ref 0.00–0.07)
Basophils Absolute: 0 10*3/uL (ref 0.0–0.1)
Basophils Relative: 1 %
Eosinophils Absolute: 0.1 10*3/uL (ref 0.0–0.5)
Eosinophils Relative: 2 %
HCT: 33.5 % — ABNORMAL LOW (ref 36.0–46.0)
Hemoglobin: 10.5 g/dL — ABNORMAL LOW (ref 12.0–15.0)
Immature Granulocytes: 0 %
Lymphocytes Relative: 37 %
Lymphs Abs: 1.4 10*3/uL (ref 0.7–4.0)
MCH: 25.4 pg — ABNORMAL LOW (ref 26.0–34.0)
MCHC: 31.3 g/dL (ref 30.0–36.0)
MCV: 80.9 fL (ref 80.0–100.0)
Monocytes Absolute: 0.3 10*3/uL (ref 0.1–1.0)
Monocytes Relative: 9 %
Neutro Abs: 2 10*3/uL (ref 1.7–7.7)
Neutrophils Relative %: 51 %
Platelet Count: 248 10*3/uL (ref 150–400)
RBC: 4.14 MIL/uL (ref 3.87–5.11)
WBC Count: 3.8 10*3/uL — ABNORMAL LOW (ref 4.0–10.5)
nRBC: 0 % (ref 0.0–0.2)

## 2022-09-29 LAB — RETICULOCYTES
Immature Retic Fract: 12.1 % (ref 2.3–15.9)
RBC.: 4.12 MIL/uL (ref 3.87–5.11)
Retic Count, Absolute: 44.5 10*3/uL (ref 19.0–186.0)
Retic Ct Pct: 1.1 % (ref 0.4–3.1)

## 2022-09-29 LAB — IRON AND IRON BINDING CAPACITY (CC-WL,HP ONLY)
Iron: 59 ug/dL (ref 28–170)
Saturation Ratios: 16 % (ref 10.4–31.8)
TIBC: 364 ug/dL (ref 250–450)
UIBC: 305 ug/dL (ref 148–442)

## 2022-09-29 LAB — FERRITIN: Ferritin: 75 ng/mL (ref 11–307)

## 2022-09-29 NOTE — Progress Notes (Signed)
Hematology and Oncology Follow Up Visit  Gail Moreno [id number] [birth year] 52 y.o. 09/29/2022   Principle Diagnosis:  Iron deficiency anemia Beta thalassemia minor  Current Therapy:   IV iron as indicated Folic acid 1 mg PO daily   Interim History:  Gail Moreno is here today for follow-up. She is feeling much better since receiving IV iron and starting the daily folic acid.  She had some mild dizziness  Her cycle remains heavy for 3-4 out of 7 days.  No other blood loss noted. No bruising or petechiae.  No fever, chills, n/v, cough, rash, dizziness, SOB, chest pain, palpitations, abdominal pain or changes in bowel or bladder habits at this time.  No swelling, tenderness, numbness or tingling in her extremities.  No falls or syncope.  Appetite and hydration are good. Weight is stable at 154 lbs.   ECOG Performance Status: 1 - Symptomatic but completely ambulatory  Medications:  Allergies as of 09/29/2022       Reactions   Dilaudid [hydromorphone Hcl] Nausea And Vomiting   Meclizine Hcl Other (See Comments)   Caused confusion and sluggishness   Singulair [montelukast Sodium]         Medication List        Accurate as of September 29, 2022 11:08 AM. If you have any questions, ask your nurse or doctor.          benzonatate 100 MG capsule Commonly known as: TESSALON Take 1 capsule (100 mg total) by mouth 3 (three) times daily as needed for cough.   famotidine 20 MG tablet Commonly known as: Pepcid Take 1 tablet (20 mg total) by mouth 2 (two) times daily. Take twice daily for the next week, then continue to use only as needed.   FeroSul 325 (65 FE) MG tablet Generic drug: ferrous sulfate TAKE 1 TABLET BY MOUTH TWICE DAILY.   fluticasone 50 MCG/ACT nasal spray Commonly known as: FLONASE Place 2 sprays into both nostrils daily.   folic acid 1 MG tablet Commonly known as: FOLVITE Take 1 tablet (1 mg total) by mouth daily.   montelukast 10 MG tablet Commonly known  as: SINGULAIR Take 1 tablet (10 mg total) by mouth at bedtime.   ondansetron 4 MG disintegrating tablet Commonly known as: ZOFRAN-ODT 4mg  ODT q8 hours prn nausea/vomit   pantoprazole 40 MG tablet Commonly known as: PROTONIX Take 1 tablet (40 mg total) by mouth daily.        Allergies:  Allergies  Allergen Reactions   Dilaudid [Hydromorphone Hcl] Nausea And Vomiting   Meclizine Hcl Other (See Comments)    Caused confusion and sluggishness   Singulair [Montelukast Sodium]     Past Medical History, Surgical history, Social history, and Family History were reviewed and updated.  Review of Systems: All other 10 point review of systems is negative.   Physical Exam:  weight is 154 lb 12.8 oz (70.2 kg). Her oral temperature is 98.2 F (36.8 C). Her blood pressure is 133/74 and her pulse is 75. Her respiration is 18 and oxygen saturation is 100%.   Wt Readings from Last 3 Encounters:  09/29/22 154 lb 12.8 oz (70.2 kg)  08/05/22 151 lb (68.5 kg)  07/28/22 152 lb 3.2 oz (69 kg)    Ocular: Sclerae unicteric, pupils equal, round and reactive to light Ear-nose-throat: Oropharynx clear, dentition fair Lymphatic: No cervical or supraclavicular adenopathy Lungs no rales or rhonchi, good excursion bilaterally Heart regular rate and rhythm, no murmur appreciated Abd soft, nontender, positive  bowel sounds MSK no focal spinal tenderness, no joint edema Neuro: non-focal, well-oriented, appropriate affect Breasts: Deferred   Lab Results  Component Value Date   WBC 3.8 (L) 09/29/2022   HGB 10.5 (L) 09/29/2022   HCT 33.5 (L) 09/29/2022   MCV 80.9 09/29/2022   PLT 248 09/29/2022   Lab Results  Component Value Date   FERRITIN 4 (L) 08/05/2022   IRON 12 (L) 08/05/2022   TIBC 475 (H) 08/05/2022   UIBC 463 (H) 08/05/2022   IRONPCTSAT 3 (L) 08/05/2022   Lab Results  Component Value Date   RETICCTPCT 1.1 09/29/2022   RBC 4.14 09/29/2022   RBC 4.12 09/29/2022   No results found  for: "KPAFRELGTCHN", "LAMBDASER", "KAPLAMBRATIO" No results found for: "IGGSERUM", "IGA", "IGMSERUM" No results found for: "TOTALPROTELP", "ALBUMINELP", "A1GS", "A2GS", "BETS", "BETA2SER", "GAMS", "MSPIKE", "SPEI"   Chemistry      Component Value Date/Time   NA 135 08/05/2022 1451   NA 139 03/30/2022 1013   K 3.7 08/05/2022 1451   CL 105 08/05/2022 1451   CO2 22 08/05/2022 1451   BUN 18 08/05/2022 1451   BUN 11 03/30/2022 1013   CREATININE 0.74 08/05/2022 1451      Component Value Date/Time   CALCIUM 8.8 (L) 08/05/2022 1451   ALKPHOS 53 08/05/2022 1451   AST 11 (L) 08/05/2022 1451   ALT 6 08/05/2022 1451   BILITOT 0.2 (L) 08/05/2022 1451       Impression and Plan: [name] is a very pleasant 52 yo African American female with history of iron deficiency anemia secondary to heavy cycles and Beta thalassemia minor. Iron studies are pending. We will replace if needed.  Continue daily folic acid.  Follow-up in 3 months.    Eileen Stanford, NP 4/9/202411:08 AM

## 2022-09-29 NOTE — Telephone Encounter (Signed)
Copied from CRM [id number]. Topic: Referral - Request for Referral >> Sep 29, 2022 11:53 AM Clide Dales wrote: Has patient seen PCP for this complaint? Yes.   *If NO, is insurance requiring patient see PCP for this issue before PCP can refer them? Referral for which specialty: Podiatry Preferred provider/office: [facility] [phone] Reason for referral: Athletes foot

## 2022-09-30 NOTE — Telephone Encounter (Signed)
Hello,  Can we change the referral

## 2022-09-30 NOTE — Telephone Encounter (Signed)
Called patient and she is aware

## 2022-09-30 NOTE — Telephone Encounter (Signed)
Called patient and she is wanting the referral sent to [facility]

## 2022-10-01 NOTE — Telephone Encounter (Signed)
VM left informing patient that referral has been placed. 

## 2022-10-29 ENCOUNTER — Emergency Department (HOSPITAL_BASED_OUTPATIENT_CLINIC_OR_DEPARTMENT_OTHER)
Admission: EM | Admit: 2022-10-29 | Discharge: 2022-10-29 | Disposition: A | Discharge status: Home / Self Care | Payer: BC Managed Care – PPO | Attending: Emergency Medicine | Admitting: Emergency Medicine

## 2022-10-29 ENCOUNTER — Other Ambulatory Visit: Payer: Self-pay

## 2022-10-29 ENCOUNTER — Encounter (HOSPITAL_BASED_OUTPATIENT_CLINIC_OR_DEPARTMENT_OTHER): Payer: Self-pay | Admitting: Emergency Medicine

## 2022-10-29 DIAGNOSIS — K439 Ventral hernia without obstruction or gangrene: Secondary | ICD-10-CM

## 2022-10-29 DIAGNOSIS — K469 Unspecified abdominal hernia without obstruction or gangrene: Secondary | ICD-10-CM | POA: Diagnosis not present

## 2022-10-29 DIAGNOSIS — K437 Other and unspecified ventral hernia with gangrene: Secondary | ICD-10-CM | POA: Insufficient documentation

## 2022-10-29 NOTE — ED Notes (Signed)
Discharge paperwork reviewed entirely with patient, including follow up care. Pain was under control. No prescriptions were called in, but all questions were addressed.  Pt verbalized understanding as well as all parties involved. No questions or concerns voiced at the time of discharge. No acute distress noted.   Pt ambulated out to PVA without incident or assistance.  

## 2022-10-29 NOTE — ED Triage Notes (Signed)
Patient arrives ambulatory by POV c/o umbilical hernia pain when touching, coughing or laughing. States she noticed it "popped out" earlier this week. No issues eating or drinking.

## 2022-10-29 NOTE — Discharge Instructions (Addendum)
You have been seen today for your complaint of hernia. Home care instructions are as follows:  Apply constant gentle pressure if you feel the hernia pop out again Follow up with: Dr. Freida Busman.  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  Call as soon as possible to schedule an appointment for follow-up visit regarding your hernia Please seek immediate medical care if you develop any of the following symptoms:  At this time there does not appear to be the presence of an emergent medical condition, however there is always the potential for conditions to change. Please read and follow the below instructions.  Do not take your medicine if  develop an itchy rash, swelling in your mouth or lips, or difficulty breathing; call 911 and seek immediate emergency medical attention if this occurs.  You may review your lab tests and imaging results in their entirety on your MyChart account.  Please discuss all results of fully with your primary care provider and other specialist at your follow-up visit.  Note: Portions of this text may have been transcribed using voice recognition software. Every effort was made to ensure accuracy; however, inadvertent computerized transcription errors may still be present.

## 2022-10-29 NOTE — ED Notes (Signed)
Cannot discharge, registration in chart.   

## 2022-10-29 NOTE — ED Provider Notes (Signed)
[place] [facility] Provider Note   CSN: [id number] Arrival date & time: 10/29/22  0946     History  Chief Complaint  Patient presents with   Hernia    Gail Moreno is a 52 y.o. female.  With history of anemia, GERD, neuromuscular disorder who presents to the ED for evaluation of a hernia.  She states she has had a midline abdominal hernia since she was born.  Typically does not cause any issues.  States she was told not to do anything about it unless it causes symptoms.  She noticed some abdominal bloating and discomfort approximately 4 days ago.  Yesterday she hit her abdomen on the side of a counter and reported sudden onset of pain.  Pain was worse with coughing and movement.  She felt like the hernia popped out at that time.  She denies nausea, vomiting, urinary symptoms.  She is having normal bowel movements and still passing gas.  She does not currently follow-up with general surgery.  HPI     Home Medications Prior to Admission medications   Medication Sig Start Date End Date Taking? Authorizing Provider  benzonatate (TESSALON) 100 MG capsule Take 1 capsule (100 mg total) by mouth 3 (three) times daily as needed for cough. Patient not taking: Reported on 03/30/2022 09/06/21   Viviano Simas, FNP  famotidine (PEPCID) 20 MG tablet Take 1 tablet (20 mg total) by mouth 2 (two) times daily. Take twice daily for the next week, then continue to use only as needed. Patient not taking: Reported on 08/05/2022 09/06/21 10/06/21  Viviano Simas, FNP  ferrous sulfate 325 (65 FE) MG tablet TAKE 1 TABLET BY MOUTH TWICE DAILY. Patient not taking: Reported on 08/05/2022 01/22/20 01/21/21  Rema Fendt, NP  fluticasone [facility]) 50 MCG/ACT nasal spray Place 2 sprays into both nostrils daily.    [provider]  folic acid (FOLVITE) 1 MG tablet Take 1 tablet (1 mg total) by mouth daily. 08/05/22   Erenest Blank, NP  montelukast (SINGULAIR) 10 MG tablet  Take 1 tablet (10 mg total) by mouth at bedtime. Patient not taking: Reported on 03/30/2022 09/24/21   Hoy Register, MD  ondansetron (ZOFRAN-ODT) 4 MG disintegrating tablet 4mg  ODT q8 hours prn nausea/vomit Patient not taking: Reported on 09/24/2021 07/25/21   Palumbo, April, MD  pantoprazole (PROTONIX) 40 MG tablet Take 1 tablet (40 mg total) by mouth daily. 07/28/22   Hoy Register, MD      Allergies    Dilaudid [hydromorphone hcl], Meclizine hcl, and Singulair [montelukast sodium]    Review of Systems   Review of Systems  Gastrointestinal:  Positive for abdominal pain.  All other systems reviewed and are negative.   Physical Exam Updated Vital Signs BP (!) 150/86   Pulse 80   Temp 98 F (36.7 C) (Oral)   Resp 17   Ht 5\' 3"  (1.6 m)   Wt 70.3 kg   LMP 10/11/2022   SpO2 100%   BMI 27.46 kg/m  Physical Exam Vitals and nursing note reviewed.  Constitutional:      General: She is not in acute distress.    Appearance: She is well-developed.  HENT:     Head: Normocephalic and atraumatic.  Eyes:     Conjunctiva/sclera: Conjunctivae normal.  Cardiovascular:     Rate and Rhythm: Normal rate and regular rhythm.     Heart sounds: No murmur heard. Pulmonary:     Effort: Pulmonary effort is normal.  No respiratory distress.     Breath sounds: Normal breath sounds.  Abdominal:     Palpations: Abdomen is soft.     Tenderness: There is no abdominal tenderness.       Comments: Midline supra umbilical hernia was easily reduced with light pressure.  No lesions or wounds of the abdomen  Musculoskeletal:        General: No swelling.     Cervical back: Neck supple.  Skin:    General: Skin is warm and dry.     Capillary Refill: Capillary refill takes less than 2 seconds.  Neurological:     Mental Status: She is alert.  Psychiatric:        Mood and Affect: Mood normal.     ED Results / Procedures / Treatments   Labs (all labs ordered are listed, but only abnormal results are  displayed) Labs Reviewed - No data to display  EKG None  Radiology No results found.  Procedures Hernia reduction  Date/Time: 10/29/2022 12:21 PM  Performed by: Michelle Piper, PA-C Authorized by: Michelle Piper, PA-C  Consent: Verbal consent obtained. Consent given by: patient Patient understanding: patient states understanding of the procedure being performed Patient consent: the patient's understanding of the procedure matches consent given Patient identity confirmed: verbally with patient and arm band Local anesthesia used: no  Anesthesia: Local anesthesia used: no  Sedation: Patient sedated: no  Patient tolerance: patient tolerated the procedure well with no immediate complications Comments: Supraumbilical hernia successfully reduced with light constant pressure       Medications Ordered in ED Medications - No data to display  ED Course/ Medical Decision Making/ A&P                             Medical Decision Making This patient presents to the ED for concern of pain near her hernia, this involves an extensive number of treatment options, and is a complaint that carries with it a high risk of complications and morbidity.  The differential diagnosis includes strangulated or incarcerated hernia  Co morbidities that complicate the patient evaluation  nemia, GERD, neuromuscular disorder  My initial workup includes hernia reduction  Additional history obtained from: Nursing notes from this visit.  Afebrile, hemodynamically stable.  52 year old female presenting to the ED for evaluation of midline supraumbilical abdominal pain.  States she has had a hernia in this location since she was born.  States she felt like it popped out yesterday.  On exam there is a soft mass consistent with a hernia.  This was reduced easily at the bedside.  Patient reported full resolution of her symptoms after reduction.  States this is the first time this symptom has  occurred.  No evidence of strangulated or incarcerated hernia at this time.  No other abdominal pain symptoms.  Patient was given contact information for general surgery and encouraged to follow-up regarding her hernia.  She may need repair if her hernia continues to displace itself.  She was given strict return precautions.  Stable at discharge.  At this time there does not appear to be any evidence of an acute emergency medical condition and the patient appears stable for discharge with appropriate outpatient follow up. Diagnosis was discussed with patient who verbalizes understanding of care plan and is agreeable to discharge. I have discussed return precautions with patient who verbalizes understanding. Patient encouraged to follow-up with general surgery as soon as possible. All questions  answered.  Note: Portions of this report may have been transcribed using voice recognition software. Every effort was made to ensure accuracy; however, inadvertent computerized transcription errors may still be present.        Final Clinical Impression(s) / ED Diagnoses Final diagnoses:  Abdominal wall hernia    Rx / DC Orders ED Discharge Orders     None         Michelle Piper, Cordelia Poche 10/29/22 1224    Pricilla Loveless, MD 10/30/22 [phone]

## 2022-11-04 ENCOUNTER — Ambulatory Visit: Payer: BC Managed Care – PPO | Admitting: Obstetrics and Gynecology

## 2022-11-05 ENCOUNTER — Telehealth: Payer: Self-pay | Admitting: Family Medicine

## 2022-11-05 DIAGNOSIS — K439 Ventral hernia without obstruction or gangrene: Secondary | ICD-10-CM

## 2022-11-05 NOTE — Telephone Encounter (Signed)
Copied from CRM [id number]. Topic: Referral - Status >> Nov 05, 2022  1:55 PM Macon Large wrote: Reason for CRM: Pt reports that she has not heard from anyone regarding her request for a referral to Dr. Mitzi Davenport L. Allen for general surgery.

## 2022-11-06 NOTE — Telephone Encounter (Signed)
Pt was seen in the ED for a hernia she is requesting referral to general surgery.

## 2022-11-06 NOTE — Addendum Note (Signed)
Addended by: Hoy Register on: 11/06/2022 12:55 PM   Modules accepted: Orders

## 2022-11-06 NOTE — Telephone Encounter (Signed)
Pt has been informed of referral being placed. 

## 2022-11-06 NOTE — Telephone Encounter (Signed)
Referral has been placed. 

## 2022-11-19 ENCOUNTER — Ambulatory Visit (INDEPENDENT_AMBULATORY_CARE_PROVIDER_SITE_OTHER): Payer: BC Managed Care – PPO | Admitting: Obstetrics and Gynecology

## 2022-11-19 ENCOUNTER — Encounter: Payer: Self-pay | Admitting: Obstetrics and Gynecology

## 2022-11-19 ENCOUNTER — Other Ambulatory Visit: Payer: Self-pay

## 2022-11-19 ENCOUNTER — Other Ambulatory Visit (HOSPITAL_COMMUNITY)
Admission: RE | Admit: 2022-11-19 | Discharge: 2022-11-19 | Disposition: A | Discharge status: Home / Self Care | Payer: BC Managed Care – PPO | Source: Ambulatory Visit | Attending: Obstetrics and Gynecology | Admitting: Obstetrics and Gynecology

## 2022-11-19 VITALS — BP 138/75 | HR 76 | Wt 155.6 lb

## 2022-11-19 DIAGNOSIS — Z01419 Encounter for gynecological examination (general) (routine) without abnormal findings: Secondary | ICD-10-CM | POA: Insufficient documentation

## 2022-11-19 DIAGNOSIS — Z124 Encounter for screening for malignant neoplasm of cervix: Secondary | ICD-10-CM | POA: Insufficient documentation

## 2022-11-19 NOTE — Progress Notes (Signed)
ANNUAL EXAM Patient name: SHAKEITHIA UMBEL MRN [id number]  Date of birth: 08-29-1970 Chief Complaint:   Gynecologic Exam  History of Present Illness:   JASE DEMO is a 52 y.o. G1P1001 being seen today for a routine annual exam.  Current complaints: annual   Menstrual concerns? No   Breast or nipple changes? No   Patient's last menstrual period was 11/04/2022.    Last pap     Component Value Date/Time   DIAGPAP  01/26/2019 0000    NEGATIVE FOR INTRAEPITHELIAL LESIONS OR MALIGNANCY.   DIAGPAP  06/07/2017 0000    NEGATIVE FOR INTRAEPITHELIAL LESIONS OR MALIGNANCY.   ADEQPAP  01/26/2019 0000    Satisfactory for evaluation  endocervical/transformation zone component PRESENT.   ADEQPAP  06/07/2017 0000    Satisfactory for evaluation  endocervical/transformation zone component PRESENT.    Last mammogram: 06/2021 BIRADS 1  Last colonoscopy: 2022     07/28/2022    3:37 PM 03/30/2022    9:31 AM 03/30/2022    9:23 AM 03/30/2022    9:22 AM 09/24/2021   11:20 AM  Depression screen PHQ 2/9  Decreased Interest 0 0 2 2 0  Down, Depressed, Hopeless 0 0 1 1 0  PHQ - 2 Score 0 0 3 3 0  Altered sleeping 0 0 1  0  Tired, decreased energy 0 0 3  0  Change in appetite 0 0 3  0  Feeling bad or failure about yourself  0 0 0  0  Trouble concentrating 0 0 1  0  Moving slowly or fidgety/restless 0 0 1  0  Suicidal thoughts 0 0 0  0  PHQ-9 Score 0 0 12  0        07/28/2022    3:38 PM 03/30/2022    9:31 AM 09/24/2021   11:20 AM 06/02/2021    3:08 PM  GAD 7 : Generalized Anxiety Score  Nervous, Anxious, on Edge 0 0 0 0  Control/stop worrying 0 0 0 0  Worry too much - different things 0 0 0 0  Trouble relaxing 0 0 0 0  Restless 0 0 0 0  Easily annoyed or irritable 0 0 0 0  Afraid - awful might happen 0 0 0 0  Total GAD 7 Score 0 0 0 0     Review of Systems:   Pertinent items are noted in HPI Denies any headaches, blurred vision, fatigue, shortness of breath, chest pain, abdominal  pain, abnormal vaginal discharge/itching/odor/irritation, problems with periods, bowel movements, urination, or intercourse unless otherwise stated above. Pertinent History Reviewed:  Reviewed past medical,surgical, social and family history.  Reviewed problem list, medications and allergies. Physical Assessment:   Vitals:   11/19/22 0824  BP: (!) 144/79  Pulse: 74  Weight: 155 lb 9.6 oz (70.6 kg)  Body mass index is 27.56 kg/m.        Physical Examination:   General appearance - well appearing, and in no distress  Mental status - alert, oriented to person, place, and time  Psych:  She has a normal mood and affect  Skin - warm and dry, normal color, no suspicious lesions noted  Chest - effort normal, all lung fields clear to auscultation bilaterally  Heart - normal rate and regular rhythm  Breasts - breasts appear normal, no suspicious masses, no skin or nipple changes or  axillary nodes  Abdomen - soft, nontender, nondistended, no masses or organomegaly  Pelvic -  VULVA: normal appearing  vulva with no masses, tenderness or lesions   VAGINA: normal appearing vagina with normal color and discharge, no lesions   CERVIX: normal appearing cervix without discharge or lesions, no CMT  Thin prep pap is done WITH HR HPV cotesting  UTERUS: uterus is felt to be enlarged with fibroids and nontender   Nontender bilateral levator ani muscles  ADNEXA: No adnexal masses or tenderness noted.  Extremities:  No swelling or varicosities noted  Chaperone present for exam  No results found for this or any previous visit (from the past 24 hour(s)).    Assessment & Plan:  1. Screening for cervical cancer 2. Well woman exam with routine gynecological exam - Cervical cancer screening: Discussed guidelines. Pap with HPV collected - Breast Health: Encouraged self breast awareness/SBE. Discussed limits of clinical breast exam for detecting breast cancer. Discussed importance of annual MXR.  Due 2025  q1-2 years - Climacteric/Sexual health: Reviewed typical and atypical symptoms of menopause/peri-menopause. Discussed PMB and to call if any amount of spotting.  - Colonoscopy: up to date - F/U 12 months and prn  - Cervicovaginal ancillary only - Cytology - PAP - HIV Antibody (routine testing w rflx) - RPR - Hepatitis C Antibody - Hepatitis B Surface AntiGEN    Orders Placed This Encounter  Procedures   HIV Antibody (routine testing w rflx)   RPR   Hepatitis C Antibody   Hepatitis B Surface AntiGEN    Meds: No orders of the defined types were placed in this encounter.   Follow-up: Return if symptoms worsen or fail to improve, for Annual GYN.  Lorriane Shire, MD 11/19/2022 8:35 AM

## 2022-11-20 ENCOUNTER — Other Ambulatory Visit: Payer: Self-pay | Admitting: Obstetrics and Gynecology

## 2022-11-20 DIAGNOSIS — N760 Acute vaginitis: Secondary | ICD-10-CM

## 2022-11-20 LAB — CERVICOVAGINAL ANCILLARY ONLY
Bacterial Vaginitis (gardnerella): POSITIVE — AB
Candida Glabrata: NEGATIVE
Candida Vaginitis: NEGATIVE
Chlamydia: NEGATIVE
Comment: NEGATIVE
Comment: NEGATIVE
Comment: NEGATIVE
Comment: NEGATIVE
Comment: NEGATIVE
Comment: NORMAL
Neisseria Gonorrhea: NEGATIVE
Trichomonas: NEGATIVE

## 2022-11-20 LAB — HEPATITIS C ANTIBODY: Hep C Virus Ab: NONREACTIVE

## 2022-11-20 LAB — HIV ANTIBODY (ROUTINE TESTING W REFLEX): HIV Screen 4th Generation wRfx: NONREACTIVE

## 2022-11-20 LAB — HEPATITIS B SURFACE ANTIGEN: Hepatitis B Surface Ag: NEGATIVE

## 2022-11-20 LAB — RPR: RPR Ser Ql: NONREACTIVE

## 2022-11-20 MED ORDER — METRONIDAZOLE 500 MG PO TABS
500.0000 mg | ORAL_TABLET | Freq: Two times a day (BID) | ORAL | 0 refills | Status: AC
Start: 2022-11-20 — End: 2022-11-27

## 2022-11-24 LAB — CYTOLOGY - PAP
Comment: NEGATIVE
Diagnosis: NEGATIVE
High risk HPV: NEGATIVE

## 2022-11-26 ENCOUNTER — Ambulatory Visit: Payer: Self-pay | Admitting: Surgery

## 2022-11-26 DIAGNOSIS — K439 Ventral hernia without obstruction or gangrene: Secondary | ICD-10-CM | POA: Diagnosis not present

## 2022-11-26 NOTE — H&P (Signed)
Subjective   Chief Complaint: New Consultation (Abdominal wall hernia)     History of Present Illness: Gail Moreno is a 52 y.o. female who is seen today as an office consultation at the request of Dr. Venetia Night for evaluation of New Consultation (Abdominal wall hernia) .  This is a 52 year old female who was told that she had a small hernia above her umbilicus since birth.  The patient has a job in Set [employer].  Recently she was at work and was leaning against the machine with her abdomen.  She began experiencing discomfort and subsequent swelling in this area.  She was examined and was noted to have a ventral hernia.  This was reducible.  She presents now to discuss repair as this area continues to cause discomfort.  The patient has no recent imaging of this area.  However, review of the CT scan from December 2017 retrospectively reveals a 1 cm hernia defect containing only fat.   Review of Systems: A complete review of systems was obtained from the patient.  I have reviewed this information and discussed as appropriate with the patient.  See HPI as well for other ROS.  Review of Systems  Constitutional: Negative.   HENT: Negative.    Eyes: Negative.   Respiratory:  Positive for cough.   Cardiovascular: Negative.   Gastrointestinal:  Positive for abdominal pain.  Genitourinary: Negative.   Musculoskeletal: Negative.   Skin: Negative.   Neurological: Negative.   Endo/Heme/Allergies: Negative.   Psychiatric/Behavioral: Negative.        Medical History: Past Medical History:  Diagnosis Date   Anemia    GERD (gastroesophageal reflux disease)     Patient Active Problem List  Diagnosis   Exertional dyspnea   Abdominal pain   Iron deficiency anemia    History reviewed. No pertinent surgical history.   Allergies  Allergen Reactions   Dilaudid [Hydromorphone] Nausea and Vomiting   Meclizine Other (See Comments)    CONFUSION AND SLUGGISH   Singulair [Montelukast] Unknown     Current Outpatient Medications on File Prior to Visit  Medication Sig Dispense Refill   ferrous sulfate 325 (65 FE) MG tablet Take 1 tablet by mouth 2 (two) times daily     fluticasone propionate (FLONASE) 50 mcg/actuation nasal spray Place 2 sprays into one nostril once daily     folic acid (FOLVITE) 1 MG tablet Take 1,000 mcg by mouth once daily     metroNIDAZOLE (METROLOTION) 0.75 % topical lotion metroNIDAZOLE     ondansetron (ZOFRAN-ODT) 4 MG disintegrating tablet 4mg  ODT q8 hours prn nausea/vomit     pantoprazole (PROTONIX) 40 MG DR tablet Take 40 mg by mouth once daily     promethazine-dextromethorphan (PROMETHAZINE-DM) 6.25-15 mg/5 mL syrup Take by mouth     No current facility-administered medications on file prior to visit.    Family History  Problem Relation Age of Onset   Diabetes Mother    Diabetes Father    Skin cancer Father    Lung cancer Father    Skin cancer Sister    Pancreatic cancer Sister    Diabetes Maternal Uncle      Social History   Tobacco Use  Smoking Status Never  Smokeless Tobacco Never     Social History   Socioeconomic History   Marital status: Divorced  Tobacco Use   Smoking status: Never   Smokeless tobacco: Never  Vaping Use   Vaping status: Never Used  Substance and Sexual Activity   Alcohol use:  Yes   Drug use: Never   Social Determinants of Health   Financial Resource Strain: Low Risk  (11/19/2022)   Received from [facility]   Overall Financial Resource Strain (CARDIA)    Difficulty of Paying Living Expenses: Not hard at all  Food Insecurity: No Food Insecurity (11/19/2022)   Received from [facility]   Hunger Vital Sign    Worried About Running Out of Food in the Last Year: Never true    Ran Out of Food in the Last Year: Never true  Transportation Needs: No Transportation Needs (11/19/2022)   Received from [facility] - Transportation    Lack of Transportation (Medical): No    Lack of Transportation  (Non-Medical): No  Physical Activity: Sufficiently Active (11/19/2022)   Received from [facility]   Exercise Vital Sign    Days of Exercise per Week: 3 days    Minutes of Exercise per Session: 150+ min  Stress: No Stress Concern Present (11/19/2022)   Received from [facility] - Occupational Stress Questionnaire    Feeling of Stress : Not at all  Social Connections: Moderately Isolated (11/19/2022)   Received from [facility]   Social Connection and Isolation Panel [NHANES]    Frequency of Communication with Friends and Family: More than three times a week    Frequency of Social Gatherings with Friends and Family: More than three times a week    Attends Religious Services: More than 4 times per year    Active Member of Clubs or Organizations: No    Marital Status: Divorced    Objective:    Vitals:   11/26/22 1443  BP: 130/70  Pulse: 79  Temp: 36.5 C (97.7 F)  SpO2: 97%  Weight: 69.6 kg (153 lb 6.4 oz)  Height: 160 cm (5\' 3" )  PainSc: 0-No pain    Body mass index is 27.17 kg/m.  Physical Exam   Constitutional:  WDWN in NAD, conversant, no obvious deformities; lying in bed comfortably Eyes:  Pupils equal, round; sclera anicteric; moist conjunctiva; no lid lag HENT:  Oral mucosa moist; good dentition  Neck:  No masses palpated, trachea midline; no thyromegaly Lungs:  CTA bilaterally; normal respiratory effort CV:  Regular rate and rhythm; no murmurs; extremities well-perfused with no edema Abd:  +bowel sounds, soft, non-tender, no palpable organomegaly; visible palpable hernia about 4 cm above the umbilicus.  The hernia sac measures approximately 3 cm in diameter.  The fascial defect is approximately 2 cm.  The hernia is partially reducible. Musc: Normal gait; no apparent clubbing or cyanosis in extremities Lymphatic:  No palpable cervical or axillary lymphadenopathy Skin:  Warm, dry; no sign of jaundice Psychiatric - alert and  oriented x 4; calm mood and affect   Assessment and Plan:  Diagnoses and all orders for this visit:  Ventral hernia without obstruction or gangrene  The patient has a 2 cm supra umbilical ventral hernia defect containing only fat.  Recommend ventral hernia repair with mesh.The surgical procedure has been discussed with the patient.  Potential risks, benefits, alternative treatments, and expected outcomes have been explained.  All of the patient's questions at this time have been answered.  The likelihood of reaching the patient's treatment goal is good.  The patient understand the proposed surgical procedure and wishes to proceed.   Lissa Morales, MD  11/26/2022 5:40 PM

## 2022-12-09 ENCOUNTER — Ambulatory Visit: Payer: BC Managed Care – PPO | Admitting: Nurse Practitioner

## 2022-12-11 DIAGNOSIS — B351 Tinea unguium: Secondary | ICD-10-CM | POA: Diagnosis not present

## 2022-12-15 ENCOUNTER — Ambulatory Visit: Payer: BC Managed Care – PPO | Admitting: Family Medicine

## 2022-12-16 ENCOUNTER — Ambulatory Visit: Payer: Self-pay | Admitting: Family Medicine

## 2022-12-16 ENCOUNTER — Ambulatory Visit: Payer: BC Managed Care – PPO | Admitting: Nurse Practitioner

## 2022-12-30 ENCOUNTER — Encounter: Payer: Self-pay | Admitting: Family

## 2022-12-30 ENCOUNTER — Inpatient Hospital Stay (HOSPITAL_BASED_OUTPATIENT_CLINIC_OR_DEPARTMENT_OTHER): Payer: BC Managed Care – PPO | Admitting: Family

## 2022-12-30 ENCOUNTER — Inpatient Hospital Stay: Payer: BC Managed Care – PPO | Attending: Hematology & Oncology

## 2022-12-30 VITALS — BP 96/81 | HR 83 | Temp 98.0°F | Resp 17 | Wt 152.1 lb

## 2022-12-30 DIAGNOSIS — D500 Iron deficiency anemia secondary to blood loss (chronic): Secondary | ICD-10-CM

## 2022-12-30 DIAGNOSIS — N920 Excessive and frequent menstruation with regular cycle: Secondary | ICD-10-CM | POA: Insufficient documentation

## 2022-12-30 DIAGNOSIS — N921 Excessive and frequent menstruation with irregular cycle: Secondary | ICD-10-CM | POA: Diagnosis not present

## 2022-12-30 DIAGNOSIS — D563 Thalassemia minor: Secondary | ICD-10-CM | POA: Diagnosis not present

## 2022-12-30 LAB — IRON AND IRON BINDING CAPACITY (CC-WL,HP ONLY)
Iron: 24 ug/dL — ABNORMAL LOW (ref 28–170)
Saturation Ratios: 6 % — ABNORMAL LOW (ref 10.4–31.8)
TIBC: 385 ug/dL (ref 250–450)
UIBC: 361 ug/dL (ref 148–442)

## 2022-12-30 LAB — CBC WITH DIFFERENTIAL (CANCER CENTER ONLY)
Abs Immature Granulocytes: 0.03 10*3/uL (ref 0.00–0.07)
Basophils Absolute: 0 10*3/uL (ref 0.0–0.1)
Basophils Relative: 1 %
Eosinophils Absolute: 0.1 10*3/uL (ref 0.0–0.5)
Eosinophils Relative: 2 %
HCT: 28.7 % — ABNORMAL LOW (ref 36.0–46.0)
Hemoglobin: 9.2 g/dL — ABNORMAL LOW (ref 12.0–15.0)
Immature Granulocytes: 1 %
Lymphocytes Relative: 38 %
Lymphs Abs: 1.4 10*3/uL (ref 0.7–4.0)
MCH: 28.3 pg (ref 26.0–34.0)
MCHC: 32.1 g/dL (ref 30.0–36.0)
MCV: 88.3 fL (ref 80.0–100.0)
Monocytes Absolute: 0.5 10*3/uL (ref 0.1–1.0)
Monocytes Relative: 12 %
Neutro Abs: 1.7 10*3/uL (ref 1.7–7.7)
Neutrophils Relative %: 46 %
Platelet Count: 232 10*3/uL (ref 150–400)
RBC: 3.25 MIL/uL — ABNORMAL LOW (ref 3.87–5.11)
RDW: 13.9 % (ref 11.5–15.5)
WBC Count: 3.6 10*3/uL — ABNORMAL LOW (ref 4.0–10.5)
nRBC: 0 % (ref 0.0–0.2)

## 2022-12-30 LAB — FERRITIN: Ferritin: 10 ng/mL — ABNORMAL LOW (ref 11–307)

## 2022-12-30 LAB — RETICULOCYTES
Immature Retic Fract: 17.2 % — ABNORMAL HIGH (ref 2.3–15.9)
RBC.: 3.2 MIL/uL — ABNORMAL LOW (ref 3.87–5.11)
Retic Count, Absolute: 44.2 10*3/uL (ref 19.0–186.0)
Retic Ct Pct: 1.4 % (ref 0.4–3.1)

## 2022-12-30 NOTE — Progress Notes (Signed)
Hematology and Oncology Follow Up Visit  Gail Moreno [id number] [birth year] 52 y.o. 12/30/2022   Principle Diagnosis:  Iron deficiency anemia Beta thalassemia minor   Current Therapy:        IV iron as indicated Folic acid 1 mg PO daily   Interim History:  Gail Moreno is here today for follow-up. She is fatigued after spending the weekend in [place] and then working night shift.  She tripped and fell while in [place] and has abrasions to the left side of her eye, shoulder and knee. Thankfully she was not more seriously injured.  She notes occasional dizziness.  No fever, chills, n/v, cough, rash, headache, SOB, chest pain, palpitations, abdominal pain or changes in bowel or bladder habits.  No swelling, tenderness, numbness or tingling in her extremities.  No syncope.  Appetite and hydration are good. Weight is stable at 152 lbs.   ECOG Performance Status: 1 - Symptomatic but completely ambulatory  Medications:  Allergies as of 12/30/2022       Reactions   Dilaudid [hydromorphone Hcl] Nausea And Vomiting   Meclizine Other (See Comments)   Caused confusion and sluggishness   Meclizine Hcl Other (See Comments)   Caused confusion and sluggishness   Montelukast    Other Other (See Comments)   Singulair [montelukast Sodium]         Medication List        Accurate as of December 30, 2022  8:53 AM. If you have any questions, ask your nurse or doctor.          benzonatate 100 MG capsule Commonly known as: TESSALON Take 1 capsule (100 mg total) by mouth 3 (three) times daily as needed for cough.   famotidine 20 MG tablet Commonly known as: Pepcid Take 1 tablet (20 mg total) by mouth 2 (two) times daily. Take twice daily for the next week, then continue to use only as needed.   FeroSul 325 (65 FE) MG tablet Generic drug: ferrous sulfate TAKE 1 TABLET BY MOUTH TWICE DAILY.   FLEXERIL PO Flexeril   fluticasone 50 MCG/ACT nasal spray Commonly known as:  FLONASE Place 2 sprays into both nostrils daily.   folic acid 1 MG tablet Commonly known as: FOLVITE Take 1 tablet (1 mg total) by mouth daily.   METRONIDAZOLE (TOPICAL) 0.75 % Lotn   ondansetron 4 MG disintegrating tablet Commonly known as: ZOFRAN-ODT 4mg  ODT q8 hours prn nausea/vomit   pantoprazole 40 MG tablet Commonly known as: PROTONIX Take 1 tablet (40 mg total) by mouth daily.   PriLOSEC 2.5 MG Pack Generic drug: Omeprazole Magnesium PriLOSEC   promethazine-dextromethorphan 6.25-15 MG/5ML syrup Commonly known as: PROMETHAZINE-DM Take 5 mLs by mouth 4 (four) times daily as needed.        Allergies:  Allergies  Allergen Reactions   Dilaudid [Hydromorphone Hcl] Nausea And Vomiting   Meclizine Other (See Comments)    Caused confusion and sluggishness   Meclizine Hcl Other (See Comments)    Caused confusion and sluggishness   Montelukast    Other Other (See Comments)   Singulair [Montelukast Sodium]     Past Medical History, Surgical history, Social history, and Family History were reviewed and updated.  Review of Systems: All other 10 point review of systems is negative.   Physical Exam:  weight is 152 lb 1.3 oz (69 kg). Her oral temperature is 98 F (36.7 C). Her blood pressure is 96/81 and her pulse is 83. Her respiration is 17 and  oxygen saturation is 100%.   Wt Readings from Last 3 Encounters:  12/30/22 152 lb 1.3 oz (69 kg)  11/19/22 155 lb 9.6 oz (70.6 kg)  10/29/22 155 lb (70.3 kg)    Ocular: Sclerae unicteric, pupils equal, round and reactive to light Ear-nose-throat: Oropharynx clear, dentition fair Lymphatic: No cervical or supraclavicular adenopathy Lungs no rales or rhonchi, good excursion bilaterally Heart regular rate and rhythm, no murmur appreciated Abd soft, nontender, positive bowel sounds MSK no focal spinal tenderness, no joint edema Neuro: non-focal, well-oriented, appropriate affect Breasts: Deferred   Lab Results  Component  Value Date   WBC 3.6 (L) 12/30/2022   HGB 9.2 (L) 12/30/2022   HCT 28.7 (L) 12/30/2022   MCV 88.3 12/30/2022   PLT 232 12/30/2022   Lab Results  Component Value Date   FERRITIN 75 09/29/2022   IRON 59 09/29/2022   TIBC 364 09/29/2022   UIBC 305 09/29/2022   IRONPCTSAT 16 09/29/2022   Lab Results  Component Value Date   RETICCTPCT 1.4 12/30/2022   RBC 3.25 (L) 12/30/2022   No results found for: "KPAFRELGTCHN", "LAMBDASER", "KAPLAMBRATIO" No results found for: "IGGSERUM", "IGA", "IGMSERUM" No results found for: "TOTALPROTELP", "ALBUMINELP", "A1GS", "A2GS", "BETS", "BETA2SER", "GAMS", "MSPIKE", "SPEI"   Chemistry      Component Value Date/Time   NA 135 08/05/2022 1451   NA 139 03/30/2022 1013   K 3.7 08/05/2022 1451   CL 105 08/05/2022 1451   CO2 22 08/05/2022 1451   BUN 18 08/05/2022 1451   BUN 11 03/30/2022 1013   CREATININE 0.74 08/05/2022 1451      Component Value Date/Time   CALCIUM 8.8 (L) 08/05/2022 1451   ALKPHOS 53 08/05/2022 1451   AST 11 (L) 08/05/2022 1451   ALT 6 08/05/2022 1451   BILITOT 0.2 (L) 08/05/2022 1451       Impression and Plan: Gail Moreno is a very pleasant 52 yo African American female with history of iron deficiency anemia secondary to heavy cycles and Beta thalassemia minor. Iron studies are pending. We will replace if needed.  Continue daily folic acid.  Follow-up in 3 months.  Eileen Stanford, NP 7/10/20248:53 AM

## 2022-12-31 DIAGNOSIS — Z20822 Contact with and (suspected) exposure to covid-19: Secondary | ICD-10-CM | POA: Diagnosis not present

## 2023-01-03 DIAGNOSIS — U071 COVID-19: Secondary | ICD-10-CM | POA: Diagnosis not present

## 2023-01-03 DIAGNOSIS — R059 Cough, unspecified: Secondary | ICD-10-CM | POA: Diagnosis not present

## 2023-01-04 ENCOUNTER — Inpatient Hospital Stay: Payer: BC Managed Care – PPO

## 2023-01-07 ENCOUNTER — Inpatient Hospital Stay: Payer: BC Managed Care – PPO

## 2023-01-07 VITALS — BP 114/66 | HR 71 | Temp 98.6°F | Resp 16

## 2023-01-07 DIAGNOSIS — N920 Excessive and frequent menstruation with regular cycle: Secondary | ICD-10-CM | POA: Diagnosis not present

## 2023-01-07 DIAGNOSIS — D500 Iron deficiency anemia secondary to blood loss (chronic): Secondary | ICD-10-CM | POA: Diagnosis not present

## 2023-01-07 DIAGNOSIS — D563 Thalassemia minor: Secondary | ICD-10-CM | POA: Diagnosis not present

## 2023-01-07 DIAGNOSIS — N921 Excessive and frequent menstruation with irregular cycle: Secondary | ICD-10-CM

## 2023-01-07 MED ORDER — SODIUM CHLORIDE 0.9 % IV SOLN
300.0000 mg | Freq: Once | INTRAVENOUS | Status: AC
  Administered 2023-01-07: 300 mg via INTRAVENOUS
  Filled 2023-01-07: qty 300

## 2023-01-07 MED ORDER — SODIUM CHLORIDE 0.9 % IV SOLN: Freq: Once | INTRAVENOUS | Status: AC

## 2023-01-07 NOTE — Patient Instructions (Signed)
Iron Sucrose Injection What is this medication? IRON SUCROSE (EYE ern SOO krose) treats low levels of iron (iron deficiency anemia) in people with kidney disease. Iron is a mineral that plays an important role in making red blood cells, which carry oxygen from your lungs to the rest of your body. This medicine may be used for other purposes; ask your health care provider or pharmacist if you have questions. COMMON BRAND NAME(S): Venofer What should I tell my care team before I take this medication? They need to know if you have any of these conditions: Anemia not caused by low iron levels Heart disease High levels of iron in the blood Kidney disease Liver disease An unusual or allergic reaction to iron, other medications, foods, dyes, or preservatives Pregnant or trying to get pregnant Breastfeeding How should I use this medication? This medication is for infusion into a vein. It is given in a hospital or clinic setting. Talk to your care team about the use of this medication in children. While this medication may be prescribed for children as young as 2 years for selected conditions, precautions do apply. Overdosage: If you think you have taken too much of this medicine contact a poison control center or emergency room at once. NOTE: This medicine is only for you. Do not share this medicine with others. What if I miss a dose? Keep appointments for follow-up doses. It is important not to miss your dose. Call your care team if you are unable to keep an appointment. What may interact with this medication? Do not take this medication with any of the following: Deferoxamine Dimercaprol Other iron products This medication may also interact with the following: Chloramphenicol Deferasirox This list may not describe all possible interactions. Give your health care provider a list of all the medicines, herbs, non-prescription drugs, or dietary supplements you use. Also tell them if you smoke,  drink alcohol, or use illegal drugs. Some items may interact with your medicine. What should I watch for while using this medication? Visit your care team regularly. Tell your care team if your symptoms do not start to get better or if they get worse. You may need blood work done while you are taking this medication. You may need to follow a special diet. Talk to your care team. Foods that contain iron include: whole grains/cereals, dried fruits, beans, or peas, leafy green vegetables, and organ meats (liver, kidney). What side effects may I notice from receiving this medication? Side effects that you should report to your care team as soon as possible: Allergic reactions--skin rash, itching, hives, swelling of the face, lips, tongue, or throat Low blood pressure--dizziness, feeling faint or lightheaded, blurry vision Shortness of breath Side effects that usually do not require medical attention (report to your care team if they continue or are bothersome): Flushing Headache Joint pain Muscle pain Nausea Pain, redness, or irritation at injection site This list may not describe all possible side effects. Call your doctor for medical advice about side effects. You may report side effects to FDA at 1-800-FDA-1088. Where should I keep my medication? This medication is given in a hospital or clinic and will not be stored at home. NOTE: This sheet is a summary. It may not cover all possible information. If you have questions about this medicine, talk to your doctor, pharmacist, or health care provider.  2024 Elsevier/Gold Standard (2021-12-17 00:00:00)  

## 2023-01-11 ENCOUNTER — Inpatient Hospital Stay: Payer: BC Managed Care – PPO

## 2023-01-11 VITALS — BP 105/66 | HR 62 | Temp 98.9°F | Resp 17

## 2023-01-11 DIAGNOSIS — N921 Excessive and frequent menstruation with irregular cycle: Secondary | ICD-10-CM

## 2023-01-11 DIAGNOSIS — D563 Thalassemia minor: Secondary | ICD-10-CM | POA: Diagnosis not present

## 2023-01-11 DIAGNOSIS — D500 Iron deficiency anemia secondary to blood loss (chronic): Secondary | ICD-10-CM | POA: Diagnosis not present

## 2023-01-11 DIAGNOSIS — N920 Excessive and frequent menstruation with regular cycle: Secondary | ICD-10-CM | POA: Diagnosis not present

## 2023-01-11 MED ORDER — SODIUM CHLORIDE 0.9 % IV SOLN
300.0000 mg | Freq: Once | INTRAVENOUS | Status: AC
  Administered 2023-01-11: 300 mg via INTRAVENOUS
  Filled 2023-01-11: qty 300

## 2023-01-11 MED ORDER — SODIUM CHLORIDE 0.9 % IV SOLN: Freq: Once | INTRAVENOUS | Status: AC

## 2023-01-11 NOTE — Patient Instructions (Signed)
Iron Sucrose Injection What is this medication? IRON SUCROSE (EYE ern SOO krose) treats low levels of iron (iron deficiency anemia) in people with kidney disease. Iron is a mineral that plays an important role in making red blood cells, which carry oxygen from your lungs to the rest of your body. This medicine may be used for other purposes; ask your health care provider or pharmacist if you have questions. COMMON BRAND NAME(S): Venofer What should I tell my care team before I take this medication? They need to know if you have any of these conditions: Anemia not caused by low iron levels Heart disease High levels of iron in the blood Kidney disease Liver disease An unusual or allergic reaction to iron, other medications, foods, dyes, or preservatives Pregnant or trying to get pregnant Breastfeeding How should I use this medication? This medication is for infusion into a vein. It is given in a hospital or clinic setting. Talk to your care team about the use of this medication in children. While this medication may be prescribed for children as young as 2 years for selected conditions, precautions do apply. Overdosage: If you think you have taken too much of this medicine contact a poison control center or emergency room at once. NOTE: This medicine is only for you. Do not share this medicine with others. What if I miss a dose? Keep appointments for follow-up doses. It is important not to miss your dose. Call your care team if you are unable to keep an appointment. What may interact with this medication? Do not take this medication with any of the following: Deferoxamine Dimercaprol Other iron products This medication may also interact with the following: Chloramphenicol Deferasirox This list may not describe all possible interactions. Give your health care provider a list of all the medicines, herbs, non-prescription drugs, or dietary supplements you use. Also tell them if you smoke,  drink alcohol, or use illegal drugs. Some items may interact with your medicine. What should I watch for while using this medication? Visit your care team regularly. Tell your care team if your symptoms do not start to get better or if they get worse. You may need blood work done while you are taking this medication. You may need to follow a special diet. Talk to your care team. Foods that contain iron include: whole grains/cereals, dried fruits, beans, or peas, leafy green vegetables, and organ meats (liver, kidney). What side effects may I notice from receiving this medication? Side effects that you should report to your care team as soon as possible: Allergic reactions--skin rash, itching, hives, swelling of the face, lips, tongue, or throat Low blood pressure--dizziness, feeling faint or lightheaded, blurry vision Shortness of breath Side effects that usually do not require medical attention (report to your care team if they continue or are bothersome): Flushing Headache Joint pain Muscle pain Nausea Pain, redness, or irritation at injection site This list may not describe all possible side effects. Call your doctor for medical advice about side effects. You may report side effects to FDA at 1-800-FDA-1088. Where should I keep my medication? This medication is given in a hospital or clinic and will not be stored at home. NOTE: This sheet is a summary. It may not cover all possible information. If you have questions about this medicine, talk to your doctor, pharmacist, or health care provider.  2024 Elsevier/Gold Standard (2021-12-17 00:00:00)  

## 2023-01-15 ENCOUNTER — Encounter (HOSPITAL_BASED_OUTPATIENT_CLINIC_OR_DEPARTMENT_OTHER): Payer: Self-pay | Admitting: Surgery

## 2023-01-18 ENCOUNTER — Inpatient Hospital Stay: Payer: BC Managed Care – PPO

## 2023-01-18 VITALS — BP 106/64 | HR 75 | Temp 98.0°F | Resp 16

## 2023-01-18 DIAGNOSIS — D563 Thalassemia minor: Secondary | ICD-10-CM | POA: Diagnosis not present

## 2023-01-18 DIAGNOSIS — N921 Excessive and frequent menstruation with irregular cycle: Secondary | ICD-10-CM

## 2023-01-18 DIAGNOSIS — D500 Iron deficiency anemia secondary to blood loss (chronic): Secondary | ICD-10-CM | POA: Diagnosis not present

## 2023-01-18 DIAGNOSIS — N920 Excessive and frequent menstruation with regular cycle: Secondary | ICD-10-CM | POA: Diagnosis not present

## 2023-01-18 MED ORDER — SODIUM CHLORIDE 0.9 % IV SOLN: Freq: Once | INTRAVENOUS | Status: AC

## 2023-01-18 MED ORDER — SODIUM CHLORIDE 0.9 % IV SOLN
300.0000 mg | Freq: Once | INTRAVENOUS | Status: AC
  Administered 2023-01-18: 300 mg via INTRAVENOUS
  Filled 2023-01-18: qty 300

## 2023-01-18 NOTE — Patient Instructions (Signed)
Iron Sucrose Injection What is this medication? IRON SUCROSE (EYE ern SOO krose) treats low levels of iron (iron deficiency anemia) in people with kidney disease. Iron is a mineral that plays an important role in making red blood cells, which carry oxygen from your lungs to the rest of your body. This medicine may be used for other purposes; ask your health care provider or pharmacist if you have questions. COMMON BRAND NAME(S): Venofer What should I tell my care team before I take this medication? They need to know if you have any of these conditions: Anemia not caused by low iron levels Heart disease High levels of iron in the blood Kidney disease Liver disease An unusual or allergic reaction to iron, other medications, foods, dyes, or preservatives Pregnant or trying to get pregnant Breastfeeding How should I use this medication? This medication is for infusion into a vein. It is given in a hospital or clinic setting. Talk to your care team about the use of this medication in children. While this medication may be prescribed for children as young as 2 years for selected conditions, precautions do apply. Overdosage: If you think you have taken too much of this medicine contact a poison control center or emergency room at once. NOTE: This medicine is only for you. Do not share this medicine with others. What if I miss a dose? Keep appointments for follow-up doses. It is important not to miss your dose. Call your care team if you are unable to keep an appointment. What may interact with this medication? Do not take this medication with any of the following: Deferoxamine Dimercaprol Other iron products This medication may also interact with the following: Chloramphenicol Deferasirox This list may not describe all possible interactions. Give your health care provider a list of all the medicines, herbs, non-prescription drugs, or dietary supplements you use. Also tell them if you smoke,  drink alcohol, or use illegal drugs. Some items may interact with your medicine. What should I watch for while using this medication? Visit your care team regularly. Tell your care team if your symptoms do not start to get better or if they get worse. You may need blood work done while you are taking this medication. You may need to follow a special diet. Talk to your care team. Foods that contain iron include: whole grains/cereals, dried fruits, beans, or peas, leafy green vegetables, and organ meats (liver, kidney). What side effects may I notice from receiving this medication? Side effects that you should report to your care team as soon as possible: Allergic reactions--skin rash, itching, hives, swelling of the face, lips, tongue, or throat Low blood pressure--dizziness, feeling faint or lightheaded, blurry vision Shortness of breath Side effects that usually do not require medical attention (report to your care team if they continue or are bothersome): Flushing Headache Joint pain Muscle pain Nausea Pain, redness, or irritation at injection site This list may not describe all possible side effects. Call your doctor for medical advice about side effects. You may report side effects to FDA at 1-800-FDA-1088. Where should I keep my medication? This medication is given in a hospital or clinic. It will not be stored at home. NOTE: This sheet is a summary. It may not cover all possible information. If you have questions about this medicine, talk to your doctor, pharmacist, or health care provider.  2024 Elsevier/Gold Standard (2022-11-13 00:00:00)

## 2023-01-26 ENCOUNTER — Encounter (HOSPITAL_BASED_OUTPATIENT_CLINIC_OR_DEPARTMENT_OTHER): Admission: RE | Payer: Self-pay | Source: Home / Self Care

## 2023-01-26 ENCOUNTER — Ambulatory Visit (HOSPITAL_BASED_OUTPATIENT_CLINIC_OR_DEPARTMENT_OTHER): Admission: RE | Admit: 2023-01-26 | Payer: BC Managed Care – PPO | Source: Home / Self Care | Admitting: Surgery

## 2023-01-26 HISTORY — DX: Peripheral vascular disease, unspecified: I73.9

## 2023-01-26 SURGERY — HERNIA REPAIR VENTRAL ADULT
Anesthesia: General

## 2023-01-27 ENCOUNTER — Ambulatory Visit: Payer: BC Managed Care – PPO | Attending: Family Medicine | Admitting: Family Medicine

## 2023-01-27 ENCOUNTER — Encounter: Payer: Self-pay | Admitting: Family Medicine

## 2023-01-27 VITALS — BP 120/76 | HR 62 | Temp 98.0°F | Ht 63.0 in | Wt 153.8 lb

## 2023-01-27 DIAGNOSIS — K219 Gastro-esophageal reflux disease without esophagitis: Secondary | ICD-10-CM

## 2023-01-27 DIAGNOSIS — K439 Ventral hernia without obstruction or gangrene: Secondary | ICD-10-CM | POA: Diagnosis not present

## 2023-01-27 DIAGNOSIS — D509 Iron deficiency anemia, unspecified: Secondary | ICD-10-CM

## 2023-01-27 DIAGNOSIS — M6740 Ganglion, unspecified site: Secondary | ICD-10-CM

## 2023-01-27 DIAGNOSIS — D219 Benign neoplasm of connective and other soft tissue, unspecified: Secondary | ICD-10-CM

## 2023-01-27 MED ORDER — PANTOPRAZOLE SODIUM 40 MG PO TBEC
40.0000 mg | DELAYED_RELEASE_TABLET | Freq: Every day | ORAL | 3 refills | Status: DC
Start: 2023-01-27 — End: 2023-05-03
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
## 2023-01-27 NOTE — Patient Instructions (Signed)
Ganglion Cyst  A ganglion cyst is a non-cancerous, fluid-filled lump of tissue that occurs near a joint, tendon, or ligament. The cyst grows out of a joint or the lining of a tendon or ligament. Ganglion cysts most often develop in the hand or wrist, but they can also develop in the shoulder, elbow, hip, knee, ankle, or foot. Ganglion cysts are ball-shaped or egg-shaped. Their size can range from the size of a pea to larger than a grape. Increased activity may cause the cyst to get bigger because more fluid starts to build up. What are the causes? The exact cause of this condition is not known, but it may be related to: Inflammation or irritation around the joint. An injury or tear in the layers of tissue around the joint (joint capsule). Repetitive movements or overuse. History of acute or repeated injury. What increases the risk? You are more likely to develop this condition if: You are a female. You are 53-50 years old. What are the signs or symptoms? The main symptom of this condition is a lump. It most often appears on the hand or wrist. In many cases, there are no other symptoms, but a cyst can sometimes cause: Tingling. Pain or tenderness. Numbness. Weakness or loss of strength in the affected joint. Decreased range of motion in the affected area of the body. How is this diagnosed? Ganglion cysts are usually diagnosed based on a physical exam. Your health care provider will feel the lump and may shine a light next to it. If it is a ganglion cyst, the light will likely shine through it. Your health care provider may order an X-ray, ultrasound, MRI, or CT scan to rule out other conditions. How is this treated? Ganglion cysts often go away on their own without treatment. If you have pain or other symptoms, treatment may be needed. Treatment is also needed if the ganglion cyst limits your movement or if it gets infected. Treatment may include: Wearing a brace or splint on your wrist or  finger. Taking anti-inflammatory medicine. Having fluid drained from the lump with a needle (aspiration). Getting an injection of medicine into the joint to decrease inflammation. This may be corticosteroids, ethanol, or hyaluronidase. Having surgery to remove the ganglion cyst. Placing a pad in your shoe or wearing shoes that will not rub against the cyst if it is on your foot. Follow these instructions at home: Do not press on the ganglion cyst, poke it with a needle, or hit it. Take over-the-counter and prescription medicines only as told by your health care provider. If you have a brace or splint: Wear it as told by your health care provider. Remove it as told by your health care provider. Ask if you need to remove it when you take a shower or a bath. Watch your ganglion cyst for any changes. Keep all follow-up visits as told by your health care provider. This is important. Contact a health care provider if: Your ganglion cyst becomes larger or more painful. You have pus coming from the lump. You have weakness or numbness in the affected area. You have a fever or chills. Get help right away if: You have a fever and have any of these in the cyst area: Increased redness. Red streaks. Swelling. Summary A ganglion cyst is a non-cancerous, fluid-filled lump that occurs near a joint, tendon, or ligament. Ganglion cysts most often develop in the hand or wrist, but they can also develop in the shoulder, elbow, hip, knee, ankle, or  foot. Ganglion cysts often go away on their own without treatment. This information is not intended to replace advice given to you by your health care provider. Make sure you discuss any questions you have with your health care provider. Document Revised: 08/30/2019 Document Reviewed: 08/30/2019 Elsevier Patient Education  2024 [employer].

## 2023-02-15 ENCOUNTER — Inpatient Hospital Stay: Payer: BC Managed Care – PPO | Attending: Hematology & Oncology

## 2023-02-15 ENCOUNTER — Inpatient Hospital Stay: Payer: BC Managed Care – PPO | Admitting: Family

## 2023-02-22 DIAGNOSIS — S32028A Other fracture of second lumbar vertebra, initial encounter for closed fracture: Secondary | ICD-10-CM | POA: Diagnosis not present

## 2023-02-22 DIAGNOSIS — S32020A Wedge compression fracture of second lumbar vertebra, initial encounter for closed fracture: Secondary | ICD-10-CM | POA: Diagnosis not present

## 2023-02-22 DIAGNOSIS — S32009A Unspecified fracture of unspecified lumbar vertebra, initial encounter for closed fracture: Secondary | ICD-10-CM | POA: Diagnosis not present

## 2023-02-22 DIAGNOSIS — M5136 Other intervertebral disc degeneration, lumbar region: Secondary | ICD-10-CM | POA: Diagnosis not present

## 2023-02-22 DIAGNOSIS — R42 Dizziness and giddiness: Secondary | ICD-10-CM | POA: Diagnosis not present

## 2023-02-22 DIAGNOSIS — S2220XA Unspecified fracture of sternum, initial encounter for closed fracture: Secondary | ICD-10-CM | POA: Diagnosis not present

## 2023-02-22 DIAGNOSIS — S32038A Other fracture of third lumbar vertebra, initial encounter for closed fracture: Secondary | ICD-10-CM | POA: Diagnosis not present

## 2023-02-22 DIAGNOSIS — S32030A Wedge compression fracture of third lumbar vertebra, initial encounter for closed fracture: Secondary | ICD-10-CM | POA: Diagnosis not present

## 2023-02-22 DIAGNOSIS — S2222XA Fracture of body of sternum, initial encounter for closed fracture: Secondary | ICD-10-CM | POA: Diagnosis not present

## 2023-02-22 DIAGNOSIS — R1084 Generalized abdominal pain: Secondary | ICD-10-CM | POA: Diagnosis not present

## 2023-02-23 ENCOUNTER — Ambulatory Visit: Payer: BC Managed Care – PPO

## 2023-02-23 ENCOUNTER — Telehealth: Payer: Self-pay | Admitting: Family Medicine

## 2023-02-23 DIAGNOSIS — S32038A Other fracture of third lumbar vertebra, initial encounter for closed fracture: Secondary | ICD-10-CM | POA: Diagnosis not present

## 2023-02-23 DIAGNOSIS — R079 Chest pain, unspecified: Secondary | ICD-10-CM | POA: Diagnosis not present

## 2023-02-23 DIAGNOSIS — S32028A Other fracture of second lumbar vertebra, initial encounter for closed fracture: Secondary | ICD-10-CM | POA: Diagnosis not present
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
## 2023-02-24 MED ORDER — POLYETHYLENE GLYCOL 3350 17 GM/SCOOP PO POWD: 17.0000 g | Freq: Every day | ORAL | 1 refills | Status: DC

## 2023-02-24 NOTE — Telephone Encounter (Signed)
Please see pervious note.

## 2023-02-24 NOTE — Telephone Encounter (Signed)
Spoke with patient  Advised of  the following per PCP. [facility] said she should follow-up with spine specialist in 2 weeks. Someone should be calling her with that appointment. I have sent a prescription for MiraLAX to her pharmacy. Constipation is most likely from her opioid analgesics. Patient  said that she was not told about the referral. I tried to see whom she was  referred her too I could find  not see anything. Advised patient that  if she did not hear from anyone in the next couple days call [facility] and let them know. Patient voiced understanding of all discussed .
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
## 2023-02-24 NOTE — Telephone Encounter (Signed)
I do see in her note from [facility] she is to follow-up with spine specialist in 2 weeks.  Someone should be calling her with that appointment.  I have sent a prescription for MiraLAX to her pharmacy.  Constipation is most likely from her opioid analgesics.

## 2023-02-24 NOTE — Addendum Note (Signed)
Addended by: Hoy Register on: 02/24/2023 04:51 PM   Modules accepted: Orders

## 2023-03-02 DIAGNOSIS — S32020A Wedge compression fracture of second lumbar vertebra, initial encounter for closed fracture: Secondary | ICD-10-CM | POA: Diagnosis not present

## 2023-03-02 DIAGNOSIS — S32030A Wedge compression fracture of third lumbar vertebra, initial encounter for closed fracture: Secondary | ICD-10-CM | POA: Diagnosis not present

## 2023-03-17 ENCOUNTER — Telehealth (INDEPENDENT_AMBULATORY_CARE_PROVIDER_SITE_OTHER): Payer: Self-pay | Admitting: Family Medicine
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
## 2023-03-24 ENCOUNTER — Encounter: Payer: Self-pay | Admitting: Family

## 2023-03-24 ENCOUNTER — Ambulatory Visit: Payer: Self-pay

## 2023-03-24 NOTE — Telephone Encounter (Signed)
Patient on the walk-in schedule for tomorrow for dizziness
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
## 2023-03-25 ENCOUNTER — Ambulatory Visit: Payer: Medicaid Other | Attending: Family Medicine | Admitting: Physician Assistant

## 2023-03-25 ENCOUNTER — Encounter: Payer: Self-pay | Admitting: Physician Assistant

## 2023-03-25 VITALS — BP 124/76 | HR 76 | Temp 98.0°F | Resp 18 | Wt 155.0 lb

## 2023-03-25 DIAGNOSIS — R42 Dizziness and giddiness: Secondary | ICD-10-CM

## 2023-03-25 DIAGNOSIS — D509 Iron deficiency anemia, unspecified: Secondary | ICD-10-CM | POA: Diagnosis not present

## 2023-03-25 DIAGNOSIS — S32020S Wedge compression fracture of second lumbar vertebra, sequela: Secondary | ICD-10-CM

## 2023-03-25 DIAGNOSIS — Z09 Encounter for follow-up examination after completed treatment for conditions other than malignant neoplasm: Secondary | ICD-10-CM

## 2023-03-25 DIAGNOSIS — H6993 Unspecified Eustachian tube disorder, bilateral: Secondary | ICD-10-CM | POA: Diagnosis not present

## 2023-03-25 MED ORDER — IRON (FERROUS SULFATE) 325 (65 FE) MG PO TABS
325.0000 mg | ORAL_TABLET | Freq: Two times a day (BID) | ORAL | 1 refills | Status: DC
Start: 2023-03-25 — End: 2023-08-31

## 2023-03-25 MED ORDER — FLUTICASONE PROPIONATE 50 MCG/ACT NA SUSP: 2.0000 | Freq: Every day | NASAL | 3 refills | Status: AC
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
## 2023-03-25 NOTE — Patient Instructions (Signed)
Vertigo Vertigo is the feeling that you or the things around you are moving when they are not. This feeling can come and go at any time. Vertigo often goes away on its own. This condition can be dangerous if it happens when you are doing activities like driving or working with machines. Your doctor will do tests to find the cause of your vertigo. These tests will also help your doctor decide on the best treatment for you. Follow these instructions at home: Eating and drinking     Drink enough fluid to keep your pee (urine) pale yellow. Do not drink alcohol. Activity Return to your normal activities when your doctor says that it is safe. In the morning, first sit up on the side of the bed. When you feel okay, stand slowly while you hold onto something until you know that your balance is fine. Move slowly. Avoid sudden body or head movements or certain positions, as told by your doctor. Use a cane if you have trouble standing or walking. Sit down right away if you feel dizzy. Avoid doing any tasks or activities that can cause danger to you or others if you get dizzy. Avoid bending down if you feel dizzy. Place items in your home so that they are easy for you to reach without bending or leaning over. Do not drive or use machinery if you feel dizzy. General instructions Take over-the-counter and prescription medicines only as told by your doctor. Keep all follow-up visits. Contact a doctor if: Your medicine does not help your vertigo. Your problems get worse or you have new symptoms. You have a fever. You feel like you may vomit (nauseous), or this feeling gets worse. You start to vomit. Your family or friends see changes in how you act. You lose feeling (have numbness) in part of your body. You feel prickling and tingling in a part of your body. Get help right away if: You are always dizzy. You faint. You get very bad headaches. You get a stiff neck. Bright light starts to bother  you. You have trouble moving or talking. You feel weak in your hands, arms, or legs. You have changes in your hearing or in how you see (vision). These symptoms may be an emergency. Get help right away. Call your local emergency services (911 in the [place] Do not wait to see if the symptoms will go away. Do not drive yourself to the hospital. Summary Vertigo is the feeling that you or the things around you are moving when they are not. Your doctor will do tests to find the cause of your vertigo. You may be told to avoid some tasks, positions, or movements. Contact a doctor if your medicine is not helping, or if you have a fever, new symptoms, or a change in how you act. Get help right away if you get very bad headaches, or if you have changes in how you speak, hear, or see. This information is not intended to replace advice given to you by your health care provider. Make sure you discuss any questions you have with your health care provider. Document Revised: 05/08/2020 Document Reviewed: 05/08/2020 Elsevier Patient Education  2024 Else[employer] Inc. Please drink 80 to 100 ounces water daily.  Take your iron and schedule an appt with Eileen Stanford, NP   Eustachian Tube Dysfunction  Eustachian tube dysfunction refers to a condition in which a blockage develops in the narrow passage that connects the middle ear to the back of the nose (  eustachian tube). The eustachian tube regulates air pressure in the middle ear by letting air move between the ear and nose. It also helps to drain fluid from the middle ear space. Eustachian tube dysfunction can affect one or both ears. When the eustachian tube does not function properly, air pressure, fluid, or both can build up in the middle ear. What are the causes? This condition occurs when the eustachian tube becomes blocked or cannot open normally. Common causes of this condition include: Ear infections. Colds and other infections that affect the nose,  mouth, and throat (upper respiratory tract). Allergies. Irritation from cigarette smoke. Irritation from stomach acid coming up into the esophagus (gastroesophageal reflux). The esophagus is the part of the body that moves food from the mouth to the stomach. Sudden changes in air pressure, such as from descending in an airplane or scuba diving. Abnormal growths in the nose or throat, such as: Growths that line the nose (nasal polyps). Abnormal growth of cells (tumors). Enlarged tissue at the back of the throat (adenoids). What increases the risk? You are more likely to develop this condition if: You smoke. You are overweight. You are a child who has: Certain birth defects of the mouth, such as cleft palate. Large tonsils or adenoids. What are the signs or symptoms? Common symptoms of this condition include: A feeling of fullness in the ear. Ear pain. Clicking or popping noises in the ear. Ringing in the ear (tinnitus). Hearing loss. Loss of balance. Dizziness. Symptoms may get worse when the air pressure around you changes, such as when you travel to an area of high elevation, fly on an airplane, or go scuba diving. How is this diagnosed? This condition may be diagnosed based on: Your symptoms. A physical exam of your ears, nose, and throat. Tests, such as those that measure: The movement of your eardrum. Your hearing (audiometry). How is this treated? Treatment depends on the cause and severity of your condition. In mild cases, you may relieve your symptoms by moving air into your ears. This is called "popping the ears." In more severe cases, or if you have symptoms of fluid in your ears, treatment may include: Medicines to relieve congestion (decongestants). Medicines that treat allergies (antihistamines). Nasal sprays or ear drops that contain medicines that reduce swelling (steroids). A procedure to drain the fluid in your eardrum. In this procedure, a small tube may be  placed in the eardrum to: Drain the fluid. Restore the air in the middle ear space. A procedure to insert a balloon device through the nose to inflate the opening of the eustachian tube (balloon dilation). Follow these instructions at home: Lifestyle Do not do any of the following until your health care provider approves: Travel to high altitudes. Fly in airplanes. Work in a Estate agent or room. Scuba dive. Do not use any products that contain nicotine or tobacco. These products include cigarettes, chewing tobacco, and vaping devices, such as e-cigarettes. If you need help quitting, ask your health care provider. Keep your ears dry. Wear fitted earplugs during showering and bathing. Dry your ears completely after. General instructions Take over-the-counter and prescription medicines only as told by your health care provider. Use techniques to help pop your ears as recommended by your health care provider. These may include: Chewing gum. Yawning. Frequent, forceful swallowing. Closing your mouth, holding your nose closed, and gently blowing as if you are trying to blow air out of your nose. Keep all follow-up visits. This is important. Contact  a health care provider if: Your symptoms do not go away after treatment. Your symptoms come back after treatment. You are unable to pop your ears. You have: A fever. Pain in your ear. Pain in your head or neck. Fluid draining from your ear. Your hearing suddenly changes. You become very dizzy. You lose your balance. Get help right away if: You have a sudden, severe increase in any of your symptoms. Summary Eustachian tube dysfunction refers to a condition in which a blockage develops in the eustachian tube. It can be caused by ear infections, allergies, inhaled irritants, or abnormal growths in the nose or throat. Symptoms may include ear pain or fullness, hearing loss, or ringing in the ears. Mild cases are treated with techniques  to unblock the ears, such as yawning or chewing gum. More severe cases are treated with medicines or procedures. This information is not intended to replace advice given to you by your health care provider. Make sure you discuss any questions you have with your health care provider. Document Revised: 08/19/2020 Document Reviewed: 08/19/2020 Elsevier Patient Education  2024 Elsevier Inc. Dizziness Dizziness is a common problem. It makes you feel unsteady or light-headed. You may feel like you are about to pass out (faint). Dizziness can lead to getting hurt if you stumble or fall. Dizziness can be caused by many things, including: Medicines. Not having enough water in your body (dehydration). Illness. Follow these instructions at home: Eating and drinking  Drink enough fluid to keep your pee (urine) pale yellow. This helps to keep you from getting dehydrated. Try to drink more clear fluids, such as water. Do not drink alcohol. Limit how much caffeine you drink or eat, if your doctor tells you to do that. Limit how much salt (sodium) you drink or eat, if your doctor tells you to do that. Activity  Avoid making quick movements. Stand up slowly from sitting in a chair, and steady yourself until you feel okay. In the morning, first sit up on the side of the bed. When you feel okay, stand up slowly while you hold onto something. Do this until you know that your balance is okay. If you need to stand in one place for a long time, move your legs often. Tighten and relax the muscles in your legs while you are standing. Do not drive or use machinery if you feel dizzy. Avoid bending down if you feel dizzy. Place items in your home so you can reach them easily without leaning over. Lifestyle Do not smoke or use any products that contain nicotine or tobacco. If you need help quitting, ask your doctor. Try to lower your stress level. You can do this by using methods such as yoga or meditation. Talk with  your doctor if you need help. General instructions Watch your dizziness for any changes. Take over-the-counter and prescription medicines only as told by your doctor. Talk with your doctor if you think that you are dizzy because of a medicine that you are taking. Tell a friend or a family member that you are feeling dizzy. If he or she notices any changes in your behavior, have this person call your doctor. Keep all follow-up visits. Contact a doctor if: Your dizziness does not go away. Your dizziness or light-headedness gets worse. You feel like you may vomit (are nauseous). You have trouble hearing. You have new symptoms. You are unsteady on your feet. You feel like the room is spinning. You have neck pain or a stiff  neck. You have a fever. Get help right away if: You vomit or have watery poop (diarrhea), and you cannot eat or drink anything. You have trouble: Talking. Walking. Swallowing. Using your arms, hands, or legs. You feel generally weak. You are not thinking clearly, or you have trouble forming sentences. A friend or family member may notice this. You have: Chest pain. Pain in your belly (abdomen). Shortness of breath. Sweating. Your vision changes. You are bleeding. You have a very bad headache. These symptoms may be an emergency. Get help right away. Call your local emergency services (911 in the [place] Do not wait to see if the symptoms will go away. Do not drive yourself to the hospital. Summary Dizziness makes you feel unsteady or light-headed. You may feel like you are about to pass out (faint). Drink enough fluid to keep your pee (urine) pale yellow. Do not drink alcohol. Avoid making quick movements if you feel dizzy. Watch your dizziness for any changes. This information is not intended to replace advice given to you by your health care provider. Make sure you discuss any questions you have with your health care provider. Document Revised: 05/10/2020  Document Reviewed: 05/13/2020 Elsevier Patient Education  2024 [employer].

## 2023-03-26 ENCOUNTER — Encounter: Payer: Self-pay | Admitting: Family

## 2023-03-26 LAB — BASIC METABOLIC PANEL
BUN/Creatinine Ratio: 12 (ref 9–23)
BUN: 10 mg/dL (ref 6–24)
CO2: 20 mmol/L (ref 20–29)
Calcium: 8.8 mg/dL (ref 8.7–10.2)
Chloride: 106 mmol/L (ref 96–106)
Creatinine, Ser: 0.85 mg/dL (ref 0.57–1.00)
Glucose: 92 mg/dL (ref 70–99)
Potassium: 3.5 mmol/L (ref 3.5–5.2)
Sodium: 139 mmol/L (ref 134–144)
eGFR: 83 mL/min/{1.73_m2} (ref >59)

## 2023-03-26 LAB — CBC WITH DIFFERENTIAL/PLATELET
Basophils Absolute: 0 10*3/uL (ref 0.0–0.2)
Basos: 0 %
EOS (ABSOLUTE): 0.1 10*3/uL (ref 0.0–0.4)
Eos: 3 %
Hematocrit: 30.3 % — ABNORMAL LOW (ref 34.0–46.6)
Hemoglobin: 9.7 g/dL — ABNORMAL LOW (ref 11.1–15.9)
Immature Grans (Abs): 0 10*3/uL (ref 0.0–0.1)
Immature Granulocytes: 0 %
Lymphocytes Absolute: 0.8 10*3/uL (ref 0.7–3.1)
Lymphs: 25 %
MCH: 29.9 pg (ref 26.6–33.0)
MCHC: 32 g/dL (ref 31.5–35.7)
MCV: 94 fL (ref 79–97)
Monocytes Absolute: 0.3 10*3/uL (ref 0.1–0.9)
Monocytes: 9 %
Neutrophils Absolute: 2 10*3/uL (ref 1.4–7.0)
Neutrophils: 63 %
Platelets: 226 10*3/uL (ref 150–450)
RBC: 3.24 x10E6/uL — ABNORMAL LOW (ref 3.77–5.28)
RDW: 13.3 % (ref 11.7–15.4)
WBC: 3.1 10*3/uL — ABNORMAL LOW (ref 3.4–10.8)

## 2023-03-26 LAB — IRON,TIBC AND FERRITIN PANEL
Ferritin: 100 ng/mL (ref 15–150)
Iron Saturation: 21 % (ref 15–55)
Iron: 54 ug/dL (ref 27–159)
Total Iron Binding Capacity: 259 ug/dL (ref 250–450)
UIBC: 205 ug/dL (ref 131–425)

## 2023-03-28 ENCOUNTER — Encounter: Payer: Self-pay | Admitting: Family

## 2023-03-29 DIAGNOSIS — B351 Tinea unguium: Secondary | ICD-10-CM | POA: Diagnosis not present

## 2023-03-30 ENCOUNTER — Telehealth: Payer: Self-pay

## 2023-03-30 NOTE — Telephone Encounter (Signed)
-----   Message from Georgian Co sent at 03/29/2023  9:20 AM EDT ----- Your blood count/anemia/iron levels are stable but is contributing to the dizziness.  I want you to continue iron tablets once a day and drink 80 ounces water daily.  Follow up with your hematologist as next scheduled.  Your kidney function and electrolytes are stable/normal.  Thanks, Georgian Co, PA-C

## 2023-03-30 NOTE — Telephone Encounter (Signed)
Patient viewed results and Doctor comment through [facility]

## 2023-03-31 ENCOUNTER — Encounter: Payer: Self-pay | Admitting: Neurology

## 2023-04-01 ENCOUNTER — Other Ambulatory Visit: Payer: Self-pay

## 2023-04-01 ENCOUNTER — Inpatient Hospital Stay: Payer: Medicaid Other | Attending: Hematology & Oncology

## 2023-04-01 ENCOUNTER — Inpatient Hospital Stay: Payer: Medicaid Other | Admitting: Medical Oncology

## 2023-04-01 ENCOUNTER — Encounter: Payer: Self-pay | Admitting: Medical Oncology

## 2023-04-01 VITALS — BP 121/98 | HR 77 | Temp 98.2°F | Resp 17 | Wt 152.0 lb

## 2023-04-01 DIAGNOSIS — D563 Thalassemia minor: Secondary | ICD-10-CM | POA: Insufficient documentation

## 2023-04-01 DIAGNOSIS — N921 Excessive and frequent menstruation with irregular cycle: Secondary | ICD-10-CM

## 2023-04-01 DIAGNOSIS — D500 Iron deficiency anemia secondary to blood loss (chronic): Secondary | ICD-10-CM

## 2023-04-01 DIAGNOSIS — S32030A Wedge compression fracture of third lumbar vertebra, initial encounter for closed fracture: Secondary | ICD-10-CM | POA: Diagnosis not present

## 2023-04-01 DIAGNOSIS — S32020A Wedge compression fracture of second lumbar vertebra, initial encounter for closed fracture: Secondary | ICD-10-CM | POA: Diagnosis not present

## 2023-04-01 LAB — CBC WITH DIFFERENTIAL (CANCER CENTER ONLY)
Abs Immature Granulocytes: 0 10*3/uL (ref 0.00–0.07)
Basophils Absolute: 0 10*3/uL (ref 0.0–0.1)
Basophils Relative: 0 %
Eosinophils Absolute: 0 10*3/uL (ref 0.0–0.5)
Eosinophils Relative: 1 %
HCT: 33.4 % — ABNORMAL LOW (ref 36.0–46.0)
Hemoglobin: 10.8 g/dL — ABNORMAL LOW (ref 12.0–15.0)
Immature Granulocytes: 0 %
Lymphocytes Relative: 23 %
Lymphs Abs: 0.7 10*3/uL (ref 0.7–4.0)
MCH: 29.8 pg (ref 26.0–34.0)
MCHC: 32.3 g/dL (ref 30.0–36.0)
MCV: 92 fL (ref 80.0–100.0)
Monocytes Absolute: 0.2 10*3/uL (ref 0.1–1.0)
Monocytes Relative: 7 %
Neutro Abs: 2 10*3/uL (ref 1.7–7.7)
Neutrophils Relative %: 69 %
Platelet Count: 277 10*3/uL (ref 150–400)
RBC: 3.63 MIL/uL — ABNORMAL LOW (ref 3.87–5.11)
RDW: 14.1 % (ref 11.5–15.5)
WBC Count: 2.9 10*3/uL — ABNORMAL LOW (ref 4.0–10.5)
nRBC: 0 % (ref 0.0–0.2)

## 2023-04-01 LAB — FERRITIN: Ferritin: 47 ng/mL (ref 11–307)

## 2023-04-01 LAB — IRON AND IRON BINDING CAPACITY (CC-WL,HP ONLY)
Iron: 104 ug/dL (ref 28–170)
Saturation Ratios: 36 % — ABNORMAL HIGH (ref 10.4–31.8)
TIBC: 293 ug/dL (ref 250–450)
UIBC: 189 ug/dL (ref 148–442)

## 2023-04-01 LAB — RETICULOCYTES
Immature Retic Fract: 13.1 % (ref 2.3–15.9)
RBC.: 3.55 MIL/uL — ABNORMAL LOW (ref 3.87–5.11)
Retic Count, Absolute: 70.6 10*3/uL (ref 19.0–186.0)
Retic Ct Pct: 2 % (ref 0.4–3.1)
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
## 2023-04-29 DIAGNOSIS — S32030A Wedge compression fracture of third lumbar vertebra, initial encounter for closed fracture: Secondary | ICD-10-CM | POA: Diagnosis not present

## 2023-04-29 DIAGNOSIS — S32020A Wedge compression fracture of second lumbar vertebra, initial encounter for closed fracture: Secondary | ICD-10-CM | POA: Diagnosis not present

## 2023-05-03 ENCOUNTER — Ambulatory Visit: Payer: Medicaid Other | Admitting: Neurology

## 2023-05-03 ENCOUNTER — Encounter: Payer: Self-pay | Admitting: Neurology

## 2023-05-03 VITALS — BP 132/85 | HR 72 | Ht 63.0 in | Wt 156.0 lb

## 2023-05-03 DIAGNOSIS — H8110 Benign paroxysmal vertigo, unspecified ear: Secondary | ICD-10-CM

## 2023-05-03 NOTE — Patient Instructions (Signed)
We will refer you for vestibular therapy
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
## 2023-05-26 ENCOUNTER — Other Ambulatory Visit: Payer: Self-pay

## 2023-05-26 ENCOUNTER — Ambulatory Visit: Payer: Medicaid Other | Attending: Neurology | Admitting: Physical Therapy

## 2023-05-26 DIAGNOSIS — M542 Cervicalgia: Secondary | ICD-10-CM | POA: Diagnosis present

## 2023-05-26 DIAGNOSIS — R42 Dizziness and giddiness: Secondary | ICD-10-CM

## 2023-05-26 DIAGNOSIS — R293 Abnormal posture: Secondary | ICD-10-CM | POA: Diagnosis present

## 2023-05-26 DIAGNOSIS — H8111 Benign paroxysmal vertigo, right ear: Secondary | ICD-10-CM

## 2023-05-26 DIAGNOSIS — H8110 Benign paroxysmal vertigo, unspecified ear: Secondary | ICD-10-CM | POA: Insufficient documentation
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
## 2023-06-01 DIAGNOSIS — S32020A Wedge compression fracture of second lumbar vertebra, initial encounter for closed fracture: Secondary | ICD-10-CM | POA: Diagnosis not present

## 2023-06-01 DIAGNOSIS — S32030A Wedge compression fracture of third lumbar vertebra, initial encounter for closed fracture: Secondary | ICD-10-CM | POA: Diagnosis not present

## 2023-06-09 ENCOUNTER — Encounter: Payer: Self-pay | Admitting: Physical Therapy

## 2023-06-09 ENCOUNTER — Ambulatory Visit: Payer: Medicaid Other | Admitting: Physical Therapy

## 2023-06-09 DIAGNOSIS — H8111 Benign paroxysmal vertigo, right ear: Secondary | ICD-10-CM | POA: Diagnosis not present

## 2023-06-09 DIAGNOSIS — R293 Abnormal posture: Secondary | ICD-10-CM

## 2023-06-09 DIAGNOSIS — R42 Dizziness and giddiness: Secondary | ICD-10-CM

## 2023-06-09 DIAGNOSIS — M542 Cervicalgia: Secondary | ICD-10-CM
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
## 2023-06-11 ENCOUNTER — Encounter: Payer: Medicaid Other | Admitting: Physical Therapy

## 2023-06-22 ENCOUNTER — Other Ambulatory Visit: Payer: Self-pay

## 2023-06-22 ENCOUNTER — Ambulatory Visit: Payer: Medicaid Other | Attending: Family Medicine | Admitting: Physical Therapy

## 2023-06-22 ENCOUNTER — Encounter: Payer: Self-pay | Admitting: Physical Therapy

## 2023-06-22 DIAGNOSIS — M5459 Other low back pain: Secondary | ICD-10-CM | POA: Diagnosis present

## 2023-06-22 DIAGNOSIS — R42 Dizziness and giddiness: Secondary | ICD-10-CM | POA: Insufficient documentation

## 2023-06-22 DIAGNOSIS — M6281 Muscle weakness (generalized): Secondary | ICD-10-CM | POA: Diagnosis present
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
## 2023-06-24 ENCOUNTER — Ambulatory Visit: Payer: Medicaid Other | Attending: Neurology

## 2023-06-24 DIAGNOSIS — M5459 Other low back pain: Secondary | ICD-10-CM | POA: Insufficient documentation

## 2023-06-24 DIAGNOSIS — M6281 Muscle weakness (generalized): Secondary | ICD-10-CM | POA: Diagnosis present
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
## 2023-06-29 ENCOUNTER — Encounter: Payer: Medicaid Other | Admitting: Physical Therapy

## 2023-07-01 ENCOUNTER — Inpatient Hospital Stay: Payer: Medicaid Other | Admitting: Medical Oncology

## 2023-07-01 ENCOUNTER — Encounter: Payer: Self-pay | Admitting: Medical Oncology

## 2023-07-01 ENCOUNTER — Inpatient Hospital Stay: Payer: Medicaid Other | Attending: Hematology & Oncology

## 2023-07-01 VITALS — BP 116/66 | HR 79 | Temp 98.2°F | Resp 18 | Ht 63.0 in | Wt 167.1 lb

## 2023-07-01 DIAGNOSIS — N921 Excessive and frequent menstruation with irregular cycle: Secondary | ICD-10-CM | POA: Insufficient documentation

## 2023-07-01 DIAGNOSIS — D563 Thalassemia minor: Secondary | ICD-10-CM | POA: Insufficient documentation

## 2023-07-01 DIAGNOSIS — D500 Iron deficiency anemia secondary to blood loss (chronic): Secondary | ICD-10-CM | POA: Insufficient documentation

## 2023-07-01 LAB — CBC WITH DIFFERENTIAL (CANCER CENTER ONLY)
Abs Immature Granulocytes: 0.01 10*3/uL (ref 0.00–0.07)
Basophils Absolute: 0 10*3/uL (ref 0.0–0.1)
Basophils Relative: 1 %
Eosinophils Absolute: 0.1 10*3/uL (ref 0.0–0.5)
Eosinophils Relative: 2 %
HCT: 33.3 % — ABNORMAL LOW (ref 36.0–46.0)
Hemoglobin: 10.9 g/dL — ABNORMAL LOW (ref 12.0–15.0)
Immature Granulocytes: 0 %
Lymphocytes Relative: 31 %
Lymphs Abs: 1.3 10*3/uL (ref 0.7–4.0)
MCH: 30 pg (ref 26.0–34.0)
MCHC: 32.7 g/dL (ref 30.0–36.0)
MCV: 91.7 fL (ref 80.0–100.0)
Monocytes Absolute: 0.5 10*3/uL (ref 0.1–1.0)
Monocytes Relative: 12 %
Neutro Abs: 2.2 10*3/uL (ref 1.7–7.7)
Neutrophils Relative %: 54 %
Platelet Count: 223 10*3/uL (ref 150–400)
RBC: 3.63 MIL/uL — ABNORMAL LOW (ref 3.87–5.11)
RDW: 12.9 % (ref 11.5–15.5)
WBC Count: 4 10*3/uL (ref 4.0–10.5)
nRBC: 0 % (ref 0.0–0.2)

## 2023-07-01 LAB — RETICULOCYTES
Immature Retic Fract: 12.1 % (ref 2.3–15.9)
RBC.: 3.63 MIL/uL — ABNORMAL LOW (ref 3.87–5.11)
Retic Count, Absolute: 55.2 10*3/uL (ref 19.0–186.0)
Retic Ct Pct: 1.5 % (ref 0.4–3.1)

## 2023-07-01 LAB — FERRITIN: Ferritin: 18 ng/mL (ref 11–307)
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
## 2023-07-02 LAB — IRON AND IRON BINDING CAPACITY (CC-WL,HP ONLY)
Iron: 153 ug/dL (ref 28–170)
Saturation Ratios: 45 % — ABNORMAL HIGH (ref 10.4–31.8)
TIBC: 339 ug/dL (ref 250–450)
UIBC: 186 ug/dL (ref 148–442)

## 2023-07-06 ENCOUNTER — Encounter: Payer: Self-pay | Admitting: Physical Therapy

## 2023-07-06 ENCOUNTER — Ambulatory Visit: Payer: Medicaid Other | Admitting: Physical Therapy

## 2023-07-06 DIAGNOSIS — M6281 Muscle weakness (generalized): Secondary | ICD-10-CM

## 2023-07-06 DIAGNOSIS — M5459 Other low back pain: Secondary | ICD-10-CM
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
## 2023-07-09 ENCOUNTER — Ambulatory Visit: Payer: Self-pay | Admitting: Surgery

## 2023-07-09 DIAGNOSIS — K439 Ventral hernia without obstruction or gangrene: Secondary | ICD-10-CM | POA: Diagnosis not present
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
## 2023-07-13 ENCOUNTER — Ambulatory Visit: Payer: Medicaid Other

## 2023-07-13 DIAGNOSIS — M5459 Other low back pain: Secondary | ICD-10-CM | POA: Diagnosis not present

## 2023-07-13 DIAGNOSIS — M6281 Muscle weakness (generalized): Secondary | ICD-10-CM
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
## 2023-07-20 ENCOUNTER — Ambulatory Visit: Payer: Medicaid Other | Admitting: Physical Therapy

## 2023-07-20 ENCOUNTER — Encounter: Payer: Self-pay | Admitting: Physical Therapy

## 2023-07-20 DIAGNOSIS — M5459 Other low back pain: Secondary | ICD-10-CM | POA: Diagnosis not present

## 2023-07-20 DIAGNOSIS — M6281 Muscle weakness (generalized): Secondary | ICD-10-CM
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
## 2023-07-20 NOTE — Patient Instructions (Signed)

## 2023-07-22 ENCOUNTER — Other Ambulatory Visit (HOSPITAL_COMMUNITY)
Admission: RE | Admit: 2023-07-22 | Discharge: 2023-07-22 | Disposition: A | Discharge status: Home / Self Care | Payer: Medicaid Other | Source: Ambulatory Visit | Attending: Obstetrics and Gynecology | Admitting: Obstetrics and Gynecology

## 2023-07-22 ENCOUNTER — Ambulatory Visit: Payer: Medicaid Other | Admitting: Obstetrics and Gynecology

## 2023-07-22 ENCOUNTER — Other Ambulatory Visit: Payer: Self-pay

## 2023-07-22 VITALS — BP 124/86 | HR 105 | Wt 166.6 lb

## 2023-07-22 DIAGNOSIS — Z113 Encounter for screening for infections with a predominantly sexual mode of transmission: Secondary | ICD-10-CM | POA: Diagnosis not present

## 2023-07-22 DIAGNOSIS — N898 Other specified noninflammatory disorders of vagina: Secondary | ICD-10-CM | POA: Insufficient documentation

## 2023-07-22 DIAGNOSIS — Z1231 Encounter for screening mammogram for malignant neoplasm of breast: Secondary | ICD-10-CM
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
## 2023-07-23 ENCOUNTER — Other Ambulatory Visit: Payer: Self-pay | Admitting: Obstetrics and Gynecology

## 2023-07-23 DIAGNOSIS — Z1231 Encounter for screening mammogram for malignant neoplasm of breast: Secondary | ICD-10-CM

## 2023-07-23 LAB — CERVICOVAGINAL ANCILLARY ONLY
Bacterial Vaginitis (gardnerella): POSITIVE — AB
Candida Glabrata: NEGATIVE
Candida Vaginitis: NEGATIVE
Chlamydia: NEGATIVE
Comment: NEGATIVE
Comment: NEGATIVE
Comment: NEGATIVE
Comment: NEGATIVE
Comment: NEGATIVE
Comment: NORMAL
Neisseria Gonorrhea: NEGATIVE
Trichomonas: NEGATIVE

## 2023-07-26 ENCOUNTER — Encounter: Payer: Self-pay | Admitting: Obstetrics and Gynecology

## 2023-07-26 ENCOUNTER — Other Ambulatory Visit: Payer: Self-pay | Admitting: Obstetrics and Gynecology

## 2023-07-26 DIAGNOSIS — N760 Acute vaginitis: Secondary | ICD-10-CM

## 2023-07-26 MED ORDER — METRONIDAZOLE 500 MG PO TABS: 500.0000 mg | ORAL_TABLET | Freq: Two times a day (BID) | ORAL | 0 refills | Status: AC

## 2023-07-26 MED ORDER — BORIC ACID VAGINAL 600 MG VA SUPP: 1.0000 | Freq: Every day | VAGINAL | 0 refills | Status: DC

## 2023-07-27 ENCOUNTER — Ambulatory Visit: Payer: Medicaid Other | Attending: Neurology | Admitting: Physical Therapy

## 2023-07-27 ENCOUNTER — Encounter: Payer: Self-pay | Admitting: Physical Therapy

## 2023-07-27 DIAGNOSIS — M5459 Other low back pain: Secondary | ICD-10-CM | POA: Insufficient documentation

## 2023-07-27 DIAGNOSIS — M6281 Muscle weakness (generalized): Secondary | ICD-10-CM | POA: Insufficient documentation
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
## 2023-07-29 ENCOUNTER — Other Ambulatory Visit: Payer: Self-pay | Admitting: Obstetrics and Gynecology

## 2023-07-29 ENCOUNTER — Ambulatory Visit
Admission: RE | Admit: 2023-07-29 | Discharge: 2023-07-29 | Disposition: A | Discharge status: Home / Self Care | Payer: Medicaid Other | Source: Ambulatory Visit

## 2023-07-29 DIAGNOSIS — Z1231 Encounter for screening mammogram for malignant neoplasm of breast: Secondary | ICD-10-CM

## 2023-07-29 DIAGNOSIS — A493 Mycoplasma infection, unspecified site: Secondary | ICD-10-CM

## 2023-07-29 DIAGNOSIS — Z2239 Carrier of other specified bacterial diseases: Secondary | ICD-10-CM

## 2023-07-29 LAB — MYCOPLASMA / UREAPLASMA CULTURE
Mycoplasma hominis Culture: POSITIVE — AB
Ureaplasma urealyticum: POSITIVE — AB

## 2023-07-29 LAB — RPR+HBSAG+HCVAB+...
HIV Screen 4th Generation wRfx: NONREACTIVE
Hep C Virus Ab: NONREACTIVE
Hepatitis B Surface Ag: NEGATIVE
RPR Ser Ql: NONREACTIVE

## 2023-07-29 MED ORDER — AZITHROMYCIN 500 MG PO TABS: ORAL_TABLET | ORAL | 0 refills | Status: AC

## 2023-07-29 MED ORDER — DOXYCYCLINE HYCLATE 100 MG PO CAPS: 100.0000 mg | ORAL_CAPSULE | Freq: Two times a day (BID) | ORAL | 0 refills | Status: AC

## 2023-08-04 ENCOUNTER — Encounter: Payer: Self-pay | Admitting: Obstetrics and Gynecology

## 2023-08-11 ENCOUNTER — Other Ambulatory Visit: Payer: Self-pay

## 2023-08-11 DIAGNOSIS — D649 Anemia, unspecified: Secondary | ICD-10-CM

## 2023-08-11 MED ORDER — FOLIC ACID 1 MG PO TABS: 1.0000 mg | ORAL_TABLET | Freq: Every day | ORAL | 4 refills | Status: AC

## 2023-08-31 DIAGNOSIS — S32020A Wedge compression fracture of second lumbar vertebra, initial encounter for closed fracture: Secondary | ICD-10-CM | POA: Diagnosis not present

## 2023-08-31 DIAGNOSIS — S32030A Wedge compression fracture of third lumbar vertebra, initial encounter for closed fracture: Secondary | ICD-10-CM | POA: Diagnosis not present
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
## 2023-09-03 ENCOUNTER — Telehealth: Admitting: Family Medicine

## 2023-09-03 ENCOUNTER — Encounter (HOSPITAL_COMMUNITY)
Admission: RE | Admit: 2023-09-03 | Discharge: 2023-09-03 | Disposition: A | Discharge status: Home / Self Care | Payer: Medicaid Other | Source: Ambulatory Visit | Attending: Surgery | Admitting: Surgery

## 2023-09-03 ENCOUNTER — Ambulatory Visit: Payer: Self-pay | Admitting: Family Medicine

## 2023-09-03 ENCOUNTER — Encounter (HOSPITAL_COMMUNITY): Payer: Self-pay

## 2023-09-03 ENCOUNTER — Other Ambulatory Visit: Payer: Self-pay

## 2023-09-03 VITALS — BP 127/76 | HR 74 | Temp 98.5°F | Resp 14 | Ht 63.0 in | Wt 166.0 lb

## 2023-09-03 DIAGNOSIS — D649 Anemia, unspecified: Secondary | ICD-10-CM | POA: Diagnosis not present

## 2023-09-03 DIAGNOSIS — Z01818 Encounter for other preprocedural examination: Secondary | ICD-10-CM | POA: Diagnosis not present

## 2023-09-03 DIAGNOSIS — N921 Excessive and frequent menstruation with irregular cycle: Secondary | ICD-10-CM | POA: Diagnosis not present

## 2023-09-03 DIAGNOSIS — R197 Diarrhea, unspecified: Secondary | ICD-10-CM

## 2023-09-03 DIAGNOSIS — I10 Essential (primary) hypertension: Secondary | ICD-10-CM | POA: Diagnosis not present

## 2023-09-03 HISTORY — DX: Disease of blood and blood-forming organs, unspecified: D75.9

## 2023-09-03 HISTORY — DX: Unspecified osteoarthritis, unspecified site: M19.90

## 2023-09-03 HISTORY — DX: Dizziness and giddiness: R42

## 2023-09-03 HISTORY — DX: Pneumonia, unspecified organism: J18.9

## 2023-09-03 HISTORY — DX: Essential (primary) hypertension: I10

## 2023-09-03 LAB — BASIC METABOLIC PANEL
Anion gap: 7 (ref 5–15)
BUN: 13 mg/dL (ref 6–20)
CO2: 21 mmol/L — ABNORMAL LOW (ref 22–32)
Calcium: 8.8 mg/dL — ABNORMAL LOW (ref 8.9–10.3)
Chloride: 107 mmol/L (ref 98–111)
Creatinine, Ser: 0.67 mg/dL (ref 0.44–1.00)
GFR, Estimated: >60 mL/min (ref >60)
Glucose, Bld: 82 mg/dL (ref 70–99)
Potassium: 4 mmol/L (ref 3.5–5.1)
Sodium: 135 mmol/L (ref 135–145)

## 2023-09-03 LAB — CBC
HCT: 34.9 % — ABNORMAL LOW (ref 36.0–46.0)
Hemoglobin: 11 g/dL — ABNORMAL LOW (ref 12.0–15.0)
MCH: 28.9 pg (ref 26.0–34.0)
MCHC: 31.5 g/dL (ref 30.0–36.0)
MCV: 91.6 fL (ref 80.0–100.0)
Platelets: 234 10*3/uL (ref 150–400)
RBC: 3.81 MIL/uL — ABNORMAL LOW (ref 3.87–5.11)
RDW: 12.6 % (ref 11.5–15.5)
WBC: 3.7 10*3/uL — ABNORMAL LOW (ref 4.0–10.5)
nRBC: 0 % (ref 0.0–0.2)

## 2023-09-03 NOTE — Telephone Encounter (Signed)
 Noted.

## 2023-09-03 NOTE — Progress Notes (Signed)
 Virtual Visit Consent   Gail Moreno, you are scheduled for a virtual visit with a [facility] provider today. Just as with appointments in the office, your consent must be obtained to participate. Your consent will be active for this visit and any virtual visit you may have with one of our providers in the next 365 days. If you have a MyChart account, a copy of this consent can be sent to you electronically.  As this is a virtual visit, video technology does not allow for your provider to perform a traditional examination. This may limit your provider's ability to fully assess your condition. If your provider identifies any concerns that need to be evaluated in person or the need to arrange testing (such as labs, EKG, etc.), we will make arrangements to do so. Although advances in technology are sophisticated, we cannot ensure that it will always work on either your end or our end. If the connection with a video visit is poor, the visit may have to be switched to a telephone visit. With either a video or telephone visit, we are not always able to ensure that we have a secure connection.  By engaging in this virtual visit, you consent to the provision of healthcare and authorize for your insurance to be billed (if applicable) for the services provided during this visit. Depending on your insurance coverage, you may receive a charge related to this service.  I need to obtain your verbal consent now. Are you willing to proceed with your visit today? Gail Moreno has provided verbal consent on 09/03/2023 for a virtual visit (video or telephone). Georgana Curio, FNP  Date: 09/03/2023 4:35 PM   Virtual Visit via Video Note   I, Georgana Curio, connected with  Gail Moreno  ([id number], 22-Aug-1970) on 09/03/23 at  4:30 PM EDT by a video-enabled telemedicine application and verified that I am speaking with the correct person using two identifiers.  Location: Patient: Virtual Visit Location Patient:  Home Provider: Virtual Visit Location Provider: Home Office   I discussed the limitations of evaluation and management by telemedicine and the availability of in person appointments. The patient expressed understanding and agreed to proceed.    History of Present Illness: Gail Moreno is a 53 y.o. who identifies as a female who was assigned female at birth, and is being seen today for diarrhea since Sunday, 3-5 watery stools a day. No fever. Abd cramping at times. No nausea or vomiting. No suspicious meals. She is hydrated. In no distress. Marland Kitchen  HPI: HPI  Problems:  Patient Active Problem List   Diagnosis Date Noted   Abdominal pain 07/23/2020   Well woman exam with routine gynecological exam 01/26/2019   Exertional dyspnea 04/11/2018   Cough 04/11/2018   Menorrhagia with irregular cycle 03/20/2016   Fibroid uterus 03/20/2016   Iron deficiency anemia 03/20/2016    Allergies:  Allergies  Allergen Reactions   Dilaudid [Hydromorphone Hcl] Nausea And Vomiting   Meclizine Other (See Comments)    Caused confusion and sluggishness   Singulair [Montelukast Sodium] Other (See Comments)    Unknown--possibly sick on the stomach   Medications:  Current Outpatient Medications:    fluticasone (FLONASE) 50 MCG/ACT nasal spray, Place 2 sprays into both nostrils daily. (Patient taking differently: Place 2 sprays into both nostrils daily as needed for rhinitis or allergies.), Disp: 16 g, Rfl: 3   folic acid (FOLVITE) 1 MG tablet, Take 1 tablet (1 mg total) by mouth daily., Disp:  90 tablet, Rfl: 4   pantoprazole (PROTONIX) 40 MG tablet, Take 40 mg by mouth daily before breakfast., Disp: , Rfl:    Probiotic Product (FORTIFY PROBIOTIC WOMENS PO), Take 1 capsule by mouth daily as needed (vaginal health/balance). URO Vaginal Probiotics for Women pH Balance with Prebiotics & Lactobacillus Probiotic Blend, Disp: , Rfl:   Observations/Objective: Patient is well-developed, well-nourished in no acute  distress.  Resting comfortably  at home.  Head is normocephalic, atraumatic.  No labored breathing.  Speech is clear and coherent with logical content.  Patient is alert and oriented at baseline.    Assessment and Plan: 1. Diarrhea, unspecified type (Primary)  Increase fluids, no milk or dairy, BRATT diet, UC if sx persist or worsen.   Follow Up Instructions: I discussed the assessment and treatment plan with the patient. The patient was provided an opportunity to ask questions and all were answered. The patient agreed with the plan and demonstrated an understanding of the instructions.  A copy of instructions were sent to the patient via MyChart unless otherwise noted below.     The patient was advised to call back or seek an in-person evaluation if the symptoms worsen or if the condition fails to improve as anticipated.    Georgana Curio, FNP

## 2023-09-03 NOTE — Telephone Encounter (Signed)
 Chief Complaint: Diarrhea  Symptoms: Diarrhea  Frequency: Since Sunday  Pertinent Negatives: Patient denies fever, vomiting, dizziness, or acute distress like symptoms.   Disposition: [] ED /[] Urgent Care (no appt availability in office) / [] Appointment(In office/virtual)/ [x]  [facility]/ [] Home Care/ [] Refused Recommended Disposition /[] [facility]/ []  Follow-up with PCP  Additional Notes: Gail Moreno is being triaged for diarrhea since Sunday. The patient states the first and second occurrence were lime green in appearance, which has then since subsided to this current point. The patient denies any vomiting, or current abdominal pain. The patient also denies a fever or limitation in diet. Due to no availability in office, scheduled patient a virtual visit per preference, patient agreed to disposition.    Reason for Disposition  [1] MODERATE diarrhea (e.g., 4-6 times / day more than normal) AND [2] present > 48 hours (2 days)  Answer Assessment - Initial Assessment Questions 1. DIARRHEA SEVERITY: "How bad is the diarrhea?" "How many more stools have you had in the past 24 hours than normal?"    - NO DIARRHEA (SCALE 0)   - MILD (SCALE 1-3): Few loose or mushy BMs; increase of 1-3 stools over normal daily number of stools; mild increase in ostomy output.   -  MODERATE (SCALE 4-7): Increase of 4-6 stools daily over normal; moderate increase in ostomy output.   -  SEVERE (SCALE 8-10; OR "WORST POSSIBLE"): Increase of 7 or more stools daily over normal; moderate increase in ostomy output; incontinence.     3-4  2. ONSET: "When did the diarrhea begin?"      Since Sunday  3. BM CONSISTENCY: "How loose or watery is the diarrhea?"      Both  4. VOMITING: "Are you also vomiting?" If Yes, ask: "How many times in the past 24 hours?"      None  5. ABDOMEN PAIN: "Are you having any abdomen pain?" If Yes, ask: "What does it feel like?" (e.g., crampy, dull, intermittent, constant)       None currently, at onset, cramping sensation  6. ABDOMEN PAIN SEVERITY: If present, ask: "How bad is the pain?"  (e.g., Scale 1-10; mild, moderate, or severe)   - MILD (1-3): doesn't interfere with normal activities, abdomen soft and not tender to touch    - MODERATE (4-7): interferes with normal activities or awakens from sleep, abdomen tender to touch    - SEVERE (8-10): excruciating pain, doubled over, unable to do any normal activities       None currently  7. ORAL INTAKE: If vomiting, "Have you been able to drink liquids?" "How much liquids have you had in the past 24 hours?"     Yes  8. HYDRATION: "Any signs of dehydration?" (e.g., dry mouth [not just dry lips], too weak to stand, dizziness, new weight loss) "When did you last urinate?"     Dry mouth, Weakness  9. EXPOSURE: "Have you traveled to a foreign country recently?" "Have you been exposed to anyone with diarrhea?" "Could you have eaten any food that was spoiled?"     No  10. ANTIBIOTIC USE: "Are you taking antibiotics now or have you taken antibiotics in the past 2 months?"       Boric Acid Rx'd by GYN       Flagyl and another antibiotic for BV in February  11. OTHER SYMPTOMS: "Do you have any other symptoms?" (e.g., fever, blood in stool)       Chills,  12. PREGNANCY: "Is there  any chance you are pregnant?" "When was your last menstrual period?"       No, LMP 08-07-2023  Protocols used: [facility]

## 2023-09-03 NOTE — Patient Instructions (Signed)
 SURGICAL WAITING ROOM VISITATION Patients having surgery or a procedure may have no more than 2 support people in the waiting area - these visitors may rotate in the visitor waiting room.   Due to an increase in RSV and influenza rates and associated hospitalizations, children ages 1 and under may not visit patients in [facility] hospitals. If the patient needs to stay at the hospital during part of their recovery, the visitor guidelines for inpatient rooms apply.  PRE-OP VISITATION  Pre-op nurse will coordinate an appropriate time for 1 support person to accompany the patient in pre-op.  This support person may not rotate.  This visitor will be contacted when the time is appropriate for the visitor to come back in the pre-op area.  Please refer to the [facility] website for the visitor guidelines for Inpatients (after your surgery is over and you are in a regular room).  You are not required to quarantine at this time prior to your surgery. However, you must do this: Hand Hygiene often Do NOT share personal items Notify your provider if you are in close contact with someone who has COVID or you develop fever 100.4 or greater, new onset of sneezing, cough, sore throat, shortness of breath or body aches.  If you test positive for Covid or have been in contact with anyone that has tested positive in the last 10 days please notify you surgeon.    Your procedure is scheduled on:  Wednesday  September 15, 2023  Report to [facility] Main Entrance: [facility] entrance where the [employer] is available.   Report to admitting at: 08:45    AM  Call this number if you have any questions or problems the morning of surgery [phone]  FOLLOW ANY ADDITIONAL PRE OP INSTRUCTIONS YOU RECEIVED FROM YOUR SURGEON'S OFFICE!!!  Do not eat food after Midnight the night prior to your surgery/procedure.  After Midnight you may have the following liquids until   08:00 AM DAY OF SURGERY  Clear Liquid  Diet Water Black Coffee (sugar ok, NO MILK/CREAM OR CREAMERS)  Tea (sugar ok, NO MILK/CREAM OR CREAMERS) regular and decaf                             Plain Jell-O  with no fruit (NO RED)                                           Fruit ices (not with fruit pulp, NO RED)                                     Popsicles (NO RED)                                                                  Juice: NO CITRUS JUICES: only apple, WHITE grape, WHITE cranberry Sports drinks like Gatorade or Powerade (NO RED)                 Oral Hygiene is also important to reduce your  risk of infection.        Remember - BRUSH YOUR TEETH THE MORNING OF SURGERY WITH YOUR REGULAR TOOTHPASTE  Do NOT smoke after Midnight the night before surgery.  STOP TAKING all Vitamins, Herbs and supplements 1 week before your surgery.   Take ONLY these medicines the morning of surgery with A SIP OF WATER: Pantoprazole. You may use your Flonase nasal spray if needed.                    You may not have any metal on your body including hair pins, jewelry, and body piercing  Do not wear make-up, lotions, powders, perfumes or deodorant  Do not wear nail polish including gel and S&S, artificial / acrylic nails, or any other type of covering on natural nails including finger and toenails. If you have artificial nails, gel coating, etc., that needs to be removed by a nail salon, Please have this removed prior to surgery. Not doing so may mean that your surgery could be cancelled or delayed if the Surgeon or anesthesia staff feels like they are unable to monitor you safely.   Do not shave 48 hours prior to surgery to avoid nicks in your skin which may contribute to postoperative infections.   Contacts, Hearing Aids, dentures or bridgework may not be worn into surgery. DENTURES WILL BE REMOVED PRIOR TO SURGERY PLEASE DO NOT APPLY "Poly grip" OR ADHESIVES!!!  Patients discharged on the day of surgery will not be allowed to drive home.   Someone NEEDS to stay with you for the first 24 hours after anesthesia.  Do not bring your home medications to the hospital. The Pharmacy will dispense medications listed on your medication list to you during your admission in the Hospital.  Please read over the following fact sheets you were given: IF YOU HAVE QUESTIONS ABOUT YOUR PRE-OP INSTRUCTIONS, PLEASE CALL [phone]   [facility] - Preparing for Surgery Before surgery, you can play an important role.  Because skin is not sterile, your skin needs to be as free of germs as possible.  You can reduce the number of germs on your skin by washing with CHG (chlorahexidine gluconate) soap before surgery.  CHG is an antiseptic cleaner which kills germs and bonds with the skin to continue killing germs even after washing. Please DO NOT use if you have an allergy to CHG or antibacterial soaps.  If your skin becomes reddened/irritated stop using the CHG and inform your nurse when you arrive at Short Stay. Do not shave (including legs and underarms) for at least 48 hours prior to the first CHG shower.  You may shave your face/neck.  Please follow these instructions carefully:  1.  Shower with CHG Soap the night before surgery and the  morning of surgery.  2.  If you choose to wash your hair, wash your hair first as usual with your normal  shampoo.  3.  After you shampoo, rinse your hair and body thoroughly to remove the shampoo.                             4.  Use CHG as you would any other liquid soap.  You can apply chg directly to the skin and wash.  Gently with a scrungie or clean washcloth.  5.  Apply the CHG Soap to your body ONLY FROM THE NECK DOWN.   Do not use on face/ open  Wound or open sores. Avoid contact with eyes, ears mouth and genitals (private parts).                       Wash face,  Genitals (private parts) with your normal soap.             6.  Wash thoroughly, paying special attention to the area where  your  surgery  will be performed.  7.  Thoroughly rinse your body with warm water from the neck down.  8.  DO NOT shower/wash with your normal soap after using and rinsing off the CHG Soap.            9.  Pat yourself dry with a clean towel.            10.  Wear clean pajamas.            11.  Place clean sheets on your bed the night of your first shower and do not  sleep with pets.  ON THE DAY OF SURGERY : Do not apply any lotions/deodorants the morning of surgery.  Please wear clean clothes to the hospital/surgery center.     FAILURE TO FOLLOW THESE INSTRUCTIONS MAY RESULT IN THE CANCELLATION OF YOUR SURGERY  PATIENT SIGNATURE_________________________________  NURSE SIGNATURE__________________________________  ________________________________________________________________________

## 2023-09-03 NOTE — Patient Instructions (Signed)
 Diarrhea, Adult Diarrhea is frequent loose and sometimes watery bowel movements. Diarrhea can make you feel weak and cause you to become dehydrated. Dehydration is a condition in which there is not enough water or other fluids in the body. Dehydration can make you tired and thirsty, cause you to have a dry mouth, and decrease how often you urinate. Diarrhea typically lasts 2-3 days. However, it can last longer if it is a sign of something more serious. It is important to treat your diarrhea as told by your health care provider. Follow these instructions at home: Eating and drinking     Follow these recommendations as told by your health care provider: Take an oral rehydration solution (ORS). This is an over-the-counter medicine that helps return your body to its normal balance of nutrients and water. It is found at pharmacies and retail stores. Drink enough fluid to keep your urine pale yellow. Drink fluids such as water, diluted fruit juice, and low-calorie sports drinks. You can drink milk also, if desired. Sucking on ice chips is another way to get fluids. Avoid drinking fluids that contain a lot of sugar or caffeine, such as soda, energy drinks, and regular sports drinks. Avoid alcohol. Eat bland, easy-to-digest foods in small amounts as you are able. These foods include bananas, applesauce, rice, lean meats, toast, and crackers. Avoid spicy or fatty foods.  Medicines Take over-the-counter and prescription medicines only as told by your health care provider. If you were prescribed antibiotics, take them as told by your health care provider. Do not stop using the antibiotic even if you start to feel better. General instructions  Wash your hands often using soap and water for at least 20 seconds. If soap and water are not available, use hand sanitizer. Others in the household should wash their hands as well. Hands should be washed: After using the toilet or changing a diaper. Before  preparing, cooking, or serving food. While caring for a sick person or while visiting someone in a hospital. Rest at home while you recover. Take a warm bath to relieve any burning or pain from frequent diarrhea episodes. Watch your condition for any changes. Contact a health care provider if: You have a fever. Your diarrhea gets worse. You have new symptoms. You vomit every time you eat or drink. You feel light-headed, dizzy, or have a headache. You have muscle cramps. You have signs of dehydration, such as: Dark urine, very little urine, or no urine. Cracked lips. Dry mouth. Sunken eyes. Sleepiness. Weakness. You have bloody or black stools or stools that look like tar. You have severe pain, cramping, or bloating in your abdomen. Your skin feels cold and clammy. You feel confused. Get help right away if: You have chest pain or your heart is beating very quickly. You have trouble breathing or you are breathing very quickly. You feel extremely weak or you faint. These symptoms may be an emergency. Get help right away. Call 911. Do not wait to see if the symptoms will go away. Do not drive yourself to the hospital. This information is not intended to replace advice given to you by your health care provider. Make sure you discuss any questions you have with your health care provider. Document Revised: 11/25/2021 Document Reviewed: 11/25/2021 Elsevier Patient Education  2024 [employer].

## 2023-09-15 ENCOUNTER — Other Ambulatory Visit: Payer: Self-pay

## 2023-09-15 ENCOUNTER — Ambulatory Visit (HOSPITAL_BASED_OUTPATIENT_CLINIC_OR_DEPARTMENT_OTHER): Payer: Self-pay

## 2023-09-15 ENCOUNTER — Ambulatory Visit (HOSPITAL_COMMUNITY): Payer: Self-pay | Admitting: Physician Assistant

## 2023-09-15 ENCOUNTER — Ambulatory Visit (HOSPITAL_COMMUNITY)
Admission: RE | Admit: 2023-09-15 | Discharge: 2023-09-15 | Disposition: A | Discharge status: Home / Self Care | Payer: Medicaid Other | Attending: Surgery | Admitting: Surgery

## 2023-09-15 ENCOUNTER — Encounter (HOSPITAL_COMMUNITY)
Admission: RE | Disposition: A | Discharge status: Home / Self Care | Payer: Self-pay | Source: Home / Self Care | Attending: Surgery

## 2023-09-15 ENCOUNTER — Encounter (HOSPITAL_COMMUNITY): Payer: Self-pay | Admitting: Surgery

## 2023-09-15 DIAGNOSIS — K219 Gastro-esophageal reflux disease without esophagitis: Secondary | ICD-10-CM | POA: Insufficient documentation

## 2023-09-15 DIAGNOSIS — K439 Ventral hernia without obstruction or gangrene: Secondary | ICD-10-CM

## 2023-09-15 DIAGNOSIS — K436 Other and unspecified ventral hernia with obstruction, without gangrene: Secondary | ICD-10-CM | POA: Diagnosis not present

## 2023-09-15 DIAGNOSIS — N921 Excessive and frequent menstruation with irregular cycle: Secondary | ICD-10-CM

## 2023-09-15 SURGERY — REPAIR, HERNIA, VENTRAL
Anesthesia: General

## 2023-09-15 MED ORDER — BUPIVACAINE-EPINEPHRINE 0.25% -1:200000 IJ SOLN
INTRAMUSCULAR | Status: DC | PRN
  Administered 2023-09-15: 20 mL

## 2023-09-15 MED ORDER — LACTATED RINGERS IV SOLN: INTRAVENOUS | Status: DC

## 2023-09-15 MED ORDER — DEXAMETHASONE SODIUM PHOSPHATE 4 MG/ML IJ SOLN
INTRAMUSCULAR | Status: DC | PRN
  Administered 2023-09-15: 10 mg via INTRAVENOUS

## 2023-09-15 MED ORDER — ONDANSETRON HCL 4 MG/2ML IJ SOLN
INTRAMUSCULAR | Status: AC
  Filled 2023-09-15: qty 2

## 2023-09-15 MED ORDER — LIDOCAINE HCL (CARDIAC) PF 100 MG/5ML IV SOSY
PREFILLED_SYRINGE | INTRAVENOUS | Status: DC | PRN
  Administered 2023-09-15: 100 mg via INTRAVENOUS

## 2023-09-15 MED ORDER — CHLORHEXIDINE GLUCONATE CLOTH 2 % EX PADS: 6.0000 | MEDICATED_PAD | Freq: Once | CUTANEOUS | Status: DC

## 2023-09-15 MED ORDER — ACETAMINOPHEN 500 MG PO TABS: 1000.0000 mg | ORAL_TABLET | Freq: Once | ORAL | Status: DC

## 2023-09-15 MED ORDER — ORAL CARE MOUTH RINSE: 15.0000 mL | Freq: Once | OROMUCOSAL | Status: AC

## 2023-09-15 MED ORDER — EPHEDRINE SULFATE (PRESSORS) 50 MG/ML IJ SOLN
INTRAMUSCULAR | Status: DC | PRN
  Administered 2023-09-15: 10 mg via INTRAVENOUS

## 2023-09-15 MED ORDER — MIDAZOLAM HCL 2 MG/2ML IJ SOLN
INTRAMUSCULAR | Status: AC
  Filled 2023-09-15: qty 2

## 2023-09-15 MED ORDER — ACETAMINOPHEN 500 MG PO TABS
1000.0000 mg | ORAL_TABLET | ORAL | Status: AC
  Administered 2023-09-15: 1000 mg via ORAL
  Filled 2023-09-15: qty 2

## 2023-09-15 MED ORDER — PROPOFOL 10 MG/ML IV BOLUS
INTRAVENOUS | Status: AC
  Filled 2023-09-15: qty 20

## 2023-09-15 MED ORDER — PHENYLEPHRINE HCL (PRESSORS) 10 MG/ML IV SOLN
INTRAVENOUS | Status: DC | PRN
Start: 2023-09-15 — End: 2023-09-15
  Administered 2023-09-15: 120 ug via INTRAVENOUS
  Administered 2023-09-15: 160 ug via INTRAVENOUS
  Administered 2023-09-15: 80 ug via INTRAVENOUS
  Administered 2023-09-15: 120 ug via INTRAVENOUS

## 2023-09-15 MED ORDER — LIDOCAINE HCL (PF) 2 % IJ SOLN
INTRAMUSCULAR | Status: AC
  Filled 2023-09-15: qty 5

## 2023-09-15 MED ORDER — KETOROLAC TROMETHAMINE 30 MG/ML IJ SOLN
INTRAMUSCULAR | Status: DC | PRN
Start: 2023-09-15 — End: 2023-09-15
  Administered 2023-09-15: 30 mg via INTRAVENOUS

## 2023-09-15 MED ORDER — DEXAMETHASONE SODIUM PHOSPHATE 10 MG/ML IJ SOLN
INTRAMUSCULAR | Status: AC
  Filled 2023-09-15: qty 1

## 2023-09-15 MED ORDER — OXYCODONE HCL 5 MG PO TABS: 5.0000 mg | ORAL_TABLET | Freq: Four times a day (QID) | ORAL | 0 refills | Status: AC | PRN

## 2023-09-15 MED ORDER — 0.9 % SODIUM CHLORIDE (POUR BTL) OPTIME
TOPICAL | Status: DC | PRN
  Administered 2023-09-15: 1000 mL

## 2023-09-15 MED ORDER — SUGAMMADEX SODIUM 200 MG/2ML IV SOLN
INTRAVENOUS | Status: AC
Start: 2023-09-15 — End: ?
  Filled 2023-09-15: qty 2

## 2023-09-15 MED ORDER — CEFAZOLIN SODIUM-DEXTROSE 2-4 GM/100ML-% IV SOLN
2.0000 g | INTRAVENOUS | Status: AC
  Administered 2023-09-15: 2 g via INTRAVENOUS
  Filled 2023-09-15: qty 100

## 2023-09-15 MED ORDER — MIDAZOLAM HCL 5 MG/5ML IJ SOLN
INTRAMUSCULAR | Status: DC | PRN
  Administered 2023-09-15: 2 mg via INTRAVENOUS

## 2023-09-15 MED ORDER — FENTANYL CITRATE (PF) 100 MCG/2ML IJ SOLN
INTRAMUSCULAR | Status: DC | PRN
  Administered 2023-09-15: 50 ug via INTRAVENOUS
  Administered 2023-09-15: 100 ug via INTRAVENOUS

## 2023-09-15 MED ORDER — BUPIVACAINE-EPINEPHRINE (PF) 0.25% -1:200000 IJ SOLN
INTRAMUSCULAR | Status: AC
  Filled 2023-09-15: qty 60

## 2023-09-15 MED ORDER — ROCURONIUM BROMIDE 10 MG/ML (PF) SYRINGE
PREFILLED_SYRINGE | INTRAVENOUS | Status: AC
  Filled 2023-09-15: qty 10

## 2023-09-15 MED ORDER — CHLORHEXIDINE GLUCONATE 0.12 % MT SOLN
15.0000 mL | Freq: Once | OROMUCOSAL | Status: AC
  Administered 2023-09-15: 15 mL via OROMUCOSAL

## 2023-09-15 MED ORDER — PROPOFOL 10 MG/ML IV BOLUS
INTRAVENOUS | Status: DC | PRN
  Administered 2023-09-15: 50 mg via INTRAVENOUS
  Administered 2023-09-15: 150 mg via INTRAVENOUS

## 2023-09-15 MED ORDER — ONDANSETRON HCL 4 MG/2ML IJ SOLN
INTRAMUSCULAR | Status: DC | PRN
  Administered 2023-09-15: 4 mg via INTRAVENOUS

## 2023-09-15 MED ORDER — FENTANYL CITRATE (PF) 250 MCG/5ML IJ SOLN
INTRAMUSCULAR | Status: AC
  Filled 2023-09-15: qty 5

## 2023-09-15 MED ORDER — PHENYLEPHRINE 80 MCG/ML (10ML) SYRINGE FOR IV PUSH (FOR BLOOD PRESSURE SUPPORT)
PREFILLED_SYRINGE | INTRAVENOUS | Status: AC
  Filled 2023-09-15: qty 10

## 2023-09-15 SURGICAL SUPPLY — 31 items
BAG COUNTER SPONGE SURGICOUNT (BAG) IMPLANT
BENZOIN TINCTURE PRP APPL 2/3 (GAUZE/BANDAGES/DRESSINGS) ×1 IMPLANT
BLADE HEX COATED 2.75 (ELECTRODE) ×1 IMPLANT
BLADE SURG 15 STRL LF DISP TIS (BLADE) ×1 IMPLANT
CHLORAPREP W/TINT 26 (MISCELLANEOUS) ×1 IMPLANT
CLSR STERI-STRIP ANTIMIC 1/2X4 (GAUZE/BANDAGES/DRESSINGS) IMPLANT
COVER SURGICAL LIGHT HANDLE (MISCELLANEOUS) ×1 IMPLANT
DRAPE LAPAROTOMY T 102X78X121 (DRAPES) ×1 IMPLANT
DRSG TEGADERM 4X4.75 (GAUZE/BANDAGES/DRESSINGS) ×1 IMPLANT
ELECT REM PT RETURN 15FT ADLT (MISCELLANEOUS) ×1 IMPLANT
GAUZE SPONGE 4X4 12PLY STRL (GAUZE/BANDAGES/DRESSINGS) IMPLANT
GAUZE SPONGE 4X4 12PLY STRL LF (GAUZE/BANDAGES/DRESSINGS) IMPLANT
GLOVE BIO SURGEON STRL SZ7 (GLOVE) ×1 IMPLANT
GLOVE BIOGEL PI IND STRL 7.5 (GLOVE) ×1 IMPLANT
GOWN STRL REUS W/ TWL LRG LVL3 (GOWN DISPOSABLE) ×1 IMPLANT
KIT BASIN OR (CUSTOM PROCEDURE TRAY) ×1 IMPLANT
KIT TURNOVER KIT A (KITS) IMPLANT
MESH VENTRALEX ST 1-7/10 CRC S (Mesh General) IMPLANT
NDL HYPO 22X1.5 SAFETY MO (MISCELLANEOUS) ×1 IMPLANT
NEEDLE HYPO 22X1.5 SAFETY MO (MISCELLANEOUS) ×1 IMPLANT
PACK BASIC VI WITH GOWN DISP (CUSTOM PROCEDURE TRAY) ×1 IMPLANT
PENCIL SMOKE EVACUATOR (MISCELLANEOUS) IMPLANT
SPIKE FLUID TRANSFER (MISCELLANEOUS) ×1 IMPLANT
SPONGE T-LAP 4X18 ~~LOC~~+RFID (SPONGE) ×1 IMPLANT
STRIP CLOSURE SKIN 1/2X4 (GAUZE/BANDAGES/DRESSINGS) ×1 IMPLANT
SUT MNCRL AB 4-0 PS2 18 (SUTURE) ×1 IMPLANT
SUT NOVA NAB GS-21 0 18 T12 DT (SUTURE) IMPLANT
SUT PROLENE 0 CT 2 (SUTURE) IMPLANT
SUT VIC AB 3-0 SH 27XBRD (SUTURE) ×1 IMPLANT
SYR CONTROL 10ML LL (SYRINGE) ×1 IMPLANT
TOWEL OR 17X26 10 PK STRL BLUE (TOWEL DISPOSABLE) ×1 IMPLANT

## 2023-09-15 NOTE — Anesthesia Procedure Notes (Signed)
 Procedure Name: LMA Insertion Date/Time: 09/15/2023 10:22 AM  Performed by: Darlina Guys, CRNAPre-anesthesia Checklist: Patient identified, Emergency Drugs available, Suction available and Patient being monitored Patient Re-evaluated:Patient Re-evaluated prior to induction Oxygen Delivery Method: Circle System Utilized Preoxygenation: Pre-oxygenation with 100% oxygen Induction Type: IV induction Ventilation: Mask ventilation without difficulty LMA: LMA inserted LMA Size: 4.0 Laryngoscope Size: 4 Number of attempts: 1 Airway Equipment and Method: Bite block Placement Confirmation: positive ETCO2 Tube secured with: Tape Dental Injury: Teeth and Oropharynx as per pre-operative assessment  Comments: Tried with regular LMA 4 (CRNA and MDA), but unable to get LMA to seat appropriately. Switched to LMA 4 proseal and able to ventilate well.

## 2023-09-15 NOTE — Discharge Instructions (Signed)
 [facility] _______[facility]  VENTRAL HERNIA REPAIR: POST OP INSTRUCTIONS  Always review your discharge instruction sheet given to you by the facility where your surgery was performed. IF YOU HAVE DISABILITY OR FAMILY LEAVE FORMS, YOU MUST BRING THEM TO THE OFFICE FOR PROCESSING.   DO NOT GIVE THEM TO YOUR DOCTOR.  1. A  prescription for pain medication may be given to you upon discharge.  Take your pain medication as prescribed, if needed.  If narcotic pain medicine is not needed, then you may take acetaminophen (Tylenol) or ibuprofen (Advil) as needed. 2. Take your usually prescribed medications unless otherwise directed. If you need a refill on your pain medication, please contact your pharmacy.  They will contact our office to request authorization. Prescriptions will not be filled after 5 pm or on week-ends. 3. You should follow a light diet the first 24 hours after arrival home, such as soup and crackers, etc.  Be sure to include lots of fluids daily.  Resume your normal diet the day after surgery. 4.Most patients will experience some swelling and bruising around the incision.  Ice packs and reclining will help.  Swelling and bruising can take several days to resolve.  6. It is common to experience some constipation if taking pain medication after surgery.  Increasing fluid intake and taking a stool softener (such as Colace) will usually help or prevent this problem from occurring.  A mild laxative (Milk of Magnesia or Miralax) should be taken according to package directions if there are no bowel movements after 48 hours. 7. Unless discharge instructions indicate otherwise, you may remove your bandages 24-48 hours after surgery, and you may shower at that time.  You may have steri-strips (small skin tapes) in place directly over the incision.  These strips should be left on the skin for 7-10 days.   8. ACTIVITIES:  You may resume regular (light) daily activities beginning the next  day--such as daily self-care, walking, climbing stairs--gradually increasing activities as tolerated.  You may have sexual intercourse when it is comfortable.  Refrain from any heavy lifting or straining until approved by your doctor.  a.You may drive when you are no longer taking prescription pain medication, you can comfortably wear a seatbelt, and you can safely maneuver your car and apply brakes. b.RETURN TO WORK:   _____________________________________________  9.You should see your doctor in the office for a follow-up appointment approximately 2-3 weeks after your surgery.  Make sure that you call for this appointment within a day or two after you arrive home to insure a convenient appointment time. 10.OTHER INSTRUCTIONS: _________________________    _____________________________________  WHEN TO CALL YOUR DOCTOR: Fever over 101.0 Inability to urinate Nausea and/or vomiting Extreme swelling or bruising Continued bleeding from incision. Increased pain, redness, or drainage from the incision  The clinic staff is available to answer your questions during regular business hours.  Please don't hesitate to call and ask to speak to one of the nurses for clinical concerns.  If you have a medical emergency, go to the nearest emergency room or call 911.  A surgeon from [facility] is always on call at the hospital   [address], [place], [place]  [address] ?  [address] [address], [place], [place]   [address] [phone] ? [phone] ? FAX [phone] Web site: [email or web address]

## 2023-09-15 NOTE — OR Nursing (Signed)
POCT Urine pregnancy negative

## 2023-09-15 NOTE — Transfer of Care (Signed)
 Immediate Anesthesia Transfer of Care Note  Patient: Gail Moreno  Procedure(s) Performed: HERNIA REPAIR VENTRAL ADULT WITH MESH  Patient Location: PACU  Anesthesia Type:General  Level of Consciousness: drowsy  Airway & Oxygen Therapy: Patient Spontanous Breathing and Patient connected to face mask oxygen  Post-op Assessment: Report given to RN and Post -op Vital signs reviewed and stable  Post vital signs: Reviewed and stable  Last Vitals:  Vitals Value Taken Time  BP 107/63 09/15/23 1106  Temp    Pulse 83 09/15/23 1110  Resp 15 09/15/23 1110  SpO2 100 % 09/15/23 1110  Vitals shown include unfiled device data.  Last Pain:  Vitals:   09/15/23 0859  TempSrc:   PainSc: 0-No pain         Complications: No notable events documented.

## 2023-09-15 NOTE — Anesthesia Postprocedure Evaluation (Signed)
 Anesthesia Post Note  Patient: Gail Moreno  Procedure(s) Performed: HERNIA REPAIR VENTRAL ADULT WITH MESH     Patient location during evaluation: PACU Anesthesia Type: General Level of consciousness: awake and alert Pain management: pain level controlled Vital Signs Assessment: post-procedure vital signs reviewed and stable Respiratory status: spontaneous breathing, nonlabored ventilation and respiratory function stable Cardiovascular status: blood pressure returned to baseline Postop Assessment: no apparent nausea or vomiting Anesthetic complications: no   No notable events documented.  Last Vitals:  Vitals:   09/15/23 1230 09/15/23 1249  BP: 105/71 133/85  Pulse: 80 79  Resp: 16   Temp:  36.5 C  SpO2: 94% 100%    Last Pain:  Vitals:   09/15/23 1249  TempSrc: (P) Oral  PainSc: 0-No pain                 Shanda Howells

## 2023-09-15 NOTE — Anesthesia Preprocedure Evaluation (Addendum)
 Anesthesia Evaluation    Reviewed: Allergy & Precautions, Patient's Chart, lab work & pertinent test results  History of Anesthesia Complications Negative for: history of anesthetic complications  Airway Mallampati: II  TM Distance: >3 FB Neck ROM: Full    Dental  (+) Partial Lower, Missing   Pulmonary neg pulmonary ROS   Pulmonary exam normal        Cardiovascular hypertension, + Peripheral Vascular Disease  Normal cardiovascular exam     Neuro/Psych negative neurological ROS     GI/Hepatic Neg liver ROS,GERD  Medicated,,SUPRAUMBILICAL VENTRAL HERNIA   Endo/Other  negative endocrine ROS    Renal/GU negative Renal ROS  negative genitourinary   Musculoskeletal  (+) Arthritis ,    Abdominal   Peds  Hematology  (+) Blood dyscrasia (Hgb 11.0), anemia   Anesthesia Other Findings Day of surgery medications reviewed with patient.  Reproductive/Obstetrics                              Anesthesia Physical Anesthesia Plan  ASA: 2  Anesthesia Plan: General   Post-op Pain Management: Tylenol PO (pre-op)*   Induction: Intravenous  PONV Risk Score and Plan: 3 and Ondansetron, Dexamethasone, Treatment may vary due to age or medical condition and Midazolam  Airway Management Planned: LMA  Additional Equipment: None  Intra-op Plan:   Post-operative Plan: Extubation in OR  Informed Consent: I have reviewed the patients History and Physical, chart, labs and discussed the procedure including the risks, benefits and alternatives for the proposed anesthesia with the patient or authorized representative who has indicated his/her understanding and acceptance.     Dental advisory given  Plan Discussed with: CRNA  Anesthesia Plan Comments:         Anesthesia Quick Evaluation

## 2023-09-15 NOTE — Op Note (Signed)
 Ventral Hernia Repair Procedure Note  Indications: This is a 53 year old female who was told that she had a small hernia above her umbilicus since birth. She was first evaluated in June 2024. The patient has a job in Set [employer]. Recently she was at work and was leaning against the machine with her abdomen. She began experiencing discomfort and subsequent swelling in this area. She was examined and was noted to have a ventral hernia. This was reducible. She presents now to discuss repair as this area continues to cause discomfort. The patient has no recent imaging of this area. However, review of the CT scan from December 2017 retrospectively reveals a 1 cm hernia defect containing only fat.   Pre-operative Diagnosis: Supraumbilical ventral hernia  Post-operative Diagnosis: Supraumbilical ventral hernia (fascial defect 2 cm)  Surgeon: Wynona Luna   Assistants: none  Anesthesia: General LMA anesthesia  ASA Class: 1  Procedure Details  The patient was seen in the Holding Room. The risks, benefits, complications, treatment options, and expected outcomes were discussed with the patient. The possibilities of reaction to medication, pulmonary aspiration, perforation of viscus, bleeding, recurrent infection, the need for additional procedures, failure to diagnose a condition, and creating a complication requiring transfusion or operation were discussed with the patient. The patient concurred with the proposed plan, giving informed consent.  The site of surgery properly noted/marked. The patient was taken to the operating room, identified as Gail Moreno and the procedure verified as ventral hernia repair. A Time Out was held and the above information confirmed.  The patient was placed supine.  After establishing general anesthesia, the abdomen was prepped with Chloraprep and draped in sterile fashion.  We made a vertical incision over the palpable hernia. Dissection was carried down to the hernia  sac located above the fascia and mobilized from surrounding structures.  Intact fascia was identified circumferentially around the defect.  The hernia sac was opened and contained only some omentum.  We amputated the herniated portion of the omentum with cautery.  The defect measured only 2 cm in diameter.  We used a small Ventralex mesh and secured this to the fascia with interrupted 0 Novofil sutures.  The fascial defect was reapproximated with interrupted figure-of-8 0 Novofil sutures.  The subcutaneous tissues were irrigated.  Hemostasis was confirmed.  The skin incision was closed in layers with 3-0 Vicryl and a 4-0 monocryl subcuticular closure.  Steri-Strips were applied at the end of the operation.    Instrument, sponge, and needle counts were correct prior to closure and at the conclusion of the case.   Findings: 2 cm fascial defect with herniated omentum  Estimated Blood Loss:  Minimal         Drains: none                      Complications:  None; patient tolerated the procedure well.         Disposition: PACU - hemodynamically stable.         Condition: stable  Wilmon Arms. Corliss Skains, MD, [facility]  General Surgery   09/15/2023 11:01 AM
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
## 2023-09-16 ENCOUNTER — Encounter (HOSPITAL_COMMUNITY): Payer: Self-pay | Admitting: Surgery

## 2023-09-16 LAB — POCT PREGNANCY, URINE: Preg Test, Ur: NEGATIVE

## 2023-09-16 LAB — SURGICAL PATHOLOGY

## 2023-09-27 DIAGNOSIS — B353 Tinea pedis: Secondary | ICD-10-CM | POA: Diagnosis not present

## 2023-09-30 ENCOUNTER — Inpatient Hospital Stay: Payer: Medicaid Other | Attending: Hematology & Oncology

## 2023-09-30 ENCOUNTER — Encounter: Payer: Self-pay | Admitting: Medical Oncology

## 2023-09-30 ENCOUNTER — Inpatient Hospital Stay (HOSPITAL_BASED_OUTPATIENT_CLINIC_OR_DEPARTMENT_OTHER): Payer: Medicaid Other | Admitting: Medical Oncology

## 2023-09-30 VITALS — BP 125/74 | HR 69 | Temp 98.3°F | Resp 18 | Ht 63.0 in | Wt 168.0 lb

## 2023-09-30 DIAGNOSIS — D563 Thalassemia minor: Secondary | ICD-10-CM

## 2023-09-30 DIAGNOSIS — D500 Iron deficiency anemia secondary to blood loss (chronic): Secondary | ICD-10-CM | POA: Insufficient documentation

## 2023-09-30 DIAGNOSIS — N921 Excessive and frequent menstruation with irregular cycle: Secondary | ICD-10-CM | POA: Diagnosis present

## 2023-09-30 LAB — CBC
HCT: 32.8 % — ABNORMAL LOW (ref 36.0–46.0)
Hemoglobin: 10.7 g/dL — ABNORMAL LOW (ref 12.0–15.0)
MCH: 29.2 pg (ref 26.0–34.0)
MCHC: 32.6 g/dL (ref 30.0–36.0)
MCV: 89.6 fL (ref 80.0–100.0)
Platelets: 260 10*3/uL (ref 150–400)
RBC: 3.66 MIL/uL — ABNORMAL LOW (ref 3.87–5.11)
RDW: 13 % (ref 11.5–15.5)
WBC: 3.7 10*3/uL — ABNORMAL LOW (ref 4.0–10.5)
nRBC: 0 % (ref 0.0–0.2)

## 2023-09-30 LAB — FERRITIN: Ferritin: 11 ng/mL (ref 11–307)

## 2023-09-30 NOTE — Progress Notes (Signed)
 Hematology and Oncology Follow Up Visit  Gail Moreno [id number] [birth year] 53 y.o. 09/30/2023   Principle Diagnosis:  Iron deficiency anemia Beta thalassemia minor   Current Therapy:        IV iron as indicated- Venofer- last infusion 01/18/2023 Folic acid 1 mg PO daily   Interim History:  Gail Moreno is here today for follow-up.    Today she states that she has been good. She had umbilical hernia repair surgery on March 26th, 2025. She is healing well and feels much better. Follow up is on April 17th, 2025.   There has been no bleeding to her knowledge other than menstrual cycles: denies epistaxis, gingivitis, hemoptysis, hematemesis, hematuria, melena, excessive bruising, blood donation.   Menstrual cycles are heavy for about 3 days then lighten up for a total of 7 days. Cycles are still regular in nature.   No fever, chills, n/v, cough, rash, headache, SOB, chest pain, palpitations, abdominal pain or changes in bowel or bladder habits.  No swelling, tenderness, numbness or tingling in her extremities.  No syncope.  Appetite and hydration are good.  Wt Readings from Last 3 Encounters:  09/30/23 168 lb (76.2 kg)  09/15/23 166 lb (75.3 kg)  09/03/23 166 lb (75.3 kg)    ECOG Performance Status: 1 - Symptomatic but completely ambulatory  Medications:  Allergies as of 09/30/2023       Reactions   Dilaudid [hydromorphone Hcl] Nausea And Vomiting   Meclizine Other (See Comments)   Caused confusion and sluggishness   Singulair [montelukast Sodium] Nausea And Vomiting, Other (See Comments)   Unknown--possibly sick on the stomach        Medication List        Accurate as of September 30, 2023  3:47 PM. If you have any questions, ask your nurse or doctor.          fluticasone 50 MCG/ACT nasal spray Commonly known as: FLONASE Place 2 sprays into both nostrils daily. What changed:  when to take this reasons to take this   folic acid 1 MG tablet Commonly known as:  FOLVITE Take 1 tablet (1 mg total) by mouth daily.   FORTIFY PROBIOTIC WOMENS PO Take 1 capsule by mouth daily as needed (vaginal health/balance). URO Vaginal Probiotics for Women pH Balance with Prebiotics & Lactobacillus Probiotic Blend   oxyCODONE 5 MG immediate release tablet Commonly known as: Oxy IR/ROXICODONE Take 1 tablet (5 mg total) by mouth every 6 (six) hours as needed for severe pain (pain score 7-10).   pantoprazole 40 MG tablet Commonly known as: PROTONIX Take 40 mg by mouth daily before breakfast.        Allergies:  Allergies  Allergen Reactions   Dilaudid [Hydromorphone Hcl] Nausea And Vomiting   Meclizine Other (See Comments)    Caused confusion and sluggishness   Singulair [Montelukast Sodium] Nausea And Vomiting and Other (See Comments)    Unknown--possibly sick on the stomach    Past Medical History, Surgical history, Social history, and Family History were reviewed and updated.  Review of Systems: All other 10 point review of systems is negative.   Physical Exam:  height is 5\' 3"  (1.6 m) and weight is 168 lb (76.2 kg). Her oral temperature is 98.3 F (36.8 C). Her blood pressure is 125/74 and her pulse is 69. Her respiration is 18 and oxygen saturation is 100%.   Wt Readings from Last 3 Encounters:  09/30/23 168 lb (76.2 kg)  09/15/23 166 lb (75.3 kg)  09/03/23 166 lb (75.3 kg)   Constitutional: AA x 3 Ocular: Sclerae unicteric, pupils equal, round and reactive to light Ear-nose-throat: Oropharynx clear, dentition fair Lymphatic: No cervical or supraclavicular adenopathy Lungs no rales or rhonchi, good excursion bilaterally Heart regular rate and rhythm, no murmur appreciated Abd soft, nontender MSK no focal spinal tenderness, no joint edema Neuro: non-focal, well-oriented, appropriate affect   Lab Results  Component Value Date   WBC 3.7 (L) 09/03/2023   HGB 11.0 (L) 09/03/2023   HCT 34.9 (L) 09/03/2023   MCV 91.6 09/03/2023   PLT 234  09/03/2023   Lab Results  Component Value Date   FERRITIN 18 07/01/2023   IRON 153 07/01/2023   TIBC 339 07/01/2023   UIBC 186 07/01/2023   IRONPCTSAT 45 (H) 07/01/2023   Lab Results  Component Value Date   RETICCTPCT 1.5 07/01/2023   RBC 3.81 (L) 09/03/2023   No results found for: "KPAFRELGTCHN", "LAMBDASER", "KAPLAMBRATIO" No results found for: "IGGSERUM", "IGA", "IGMSERUM" No results found for: "TOTALPROTELP", "ALBUMINELP", "A1GS", "A2GS", "BETS", "BETA2SER", "GAMS", "MSPIKE", "SPEI"   Chemistry      Component Value Date/Time   NA 135 09/03/2023 1359   NA 139 03/25/2023 0951   K 4.0 09/03/2023 1359   CL 107 09/03/2023 1359   CO2 21 (L) 09/03/2023 1359   BUN 13 09/03/2023 1359   BUN 10 03/25/2023 0951   CREATININE 0.67 09/03/2023 1359   CREATININE 0.74 08/05/2022 1451      Component Value Date/Time   CALCIUM 8.8 (L) 09/03/2023 1359   ALKPHOS 53 08/05/2022 1451   AST 11 (L) 08/05/2022 1451   ALT 6 08/05/2022 1451   BILITOT 0.2 (L) 08/05/2022 1451     Encounter Diagnoses  Name Primary?   Iron deficiency anemia due to chronic blood loss Yes   Menorrhagia with irregular cycle    Beta thalassemia minor      Impression and Plan: [name] is a very pleasant 53 yo African American female with history of iron deficiency anemia secondary to heavy cycles and Beta thalassemia minor.  CBC pending  Iron studies are pending. We will replace iron if needed.  Continue daily folic acid.    RTC 6 months APP, labs (CBC, iron, ferritin)  Rushie Chestnut, PA-C 4/10/20253:47 PM

## 2023-10-01 LAB — IRON AND IRON BINDING CAPACITY (CC-WL,HP ONLY)
Iron: 97 ug/dL (ref 28–170)
Saturation Ratios: 25 % (ref 10.4–31.8)
TIBC: 382 ug/dL (ref 250–450)
UIBC: 285 ug/dL (ref 148–442)

## 2023-10-05 ENCOUNTER — Other Ambulatory Visit: Payer: Self-pay | Admitting: Medical Oncology

## 2023-10-05 ENCOUNTER — Encounter: Payer: Self-pay | Admitting: Medical Oncology

## 2023-10-07 DIAGNOSIS — K439 Ventral hernia without obstruction or gangrene: Secondary | ICD-10-CM | POA: Diagnosis not present

## 2023-10-13 ENCOUNTER — Inpatient Hospital Stay

## 2023-10-13 VITALS — BP 115/56 | HR 75 | Temp 97.7°F | Resp 18

## 2023-10-13 DIAGNOSIS — D500 Iron deficiency anemia secondary to blood loss (chronic): Secondary | ICD-10-CM | POA: Diagnosis not present

## 2023-10-13 DIAGNOSIS — N921 Excessive and frequent menstruation with irregular cycle: Secondary | ICD-10-CM

## 2023-10-13 MED ORDER — SODIUM CHLORIDE 0.9 % IV SOLN: Freq: Once | INTRAVENOUS | Status: AC

## 2023-10-13 MED ORDER — SODIUM CHLORIDE 0.9 % IV SOLN
300.0000 mg | Freq: Once | INTRAVENOUS | Status: AC
  Administered 2023-10-13: 300 mg via INTRAVENOUS
  Filled 2023-10-13: qty 300

## 2023-10-20 ENCOUNTER — Inpatient Hospital Stay

## 2023-10-20 VITALS — BP 142/62 | HR 79 | Temp 97.8°F | Resp 20

## 2023-10-20 DIAGNOSIS — D500 Iron deficiency anemia secondary to blood loss (chronic): Secondary | ICD-10-CM

## 2023-10-20 DIAGNOSIS — N921 Excessive and frequent menstruation with irregular cycle: Secondary | ICD-10-CM

## 2023-10-20 MED ORDER — SODIUM CHLORIDE 0.9 % IV SOLN
300.0000 mg | Freq: Once | INTRAVENOUS | Status: AC
  Administered 2023-10-20: 300 mg via INTRAVENOUS
  Filled 2023-10-20: qty 300

## 2023-10-20 MED ORDER — SODIUM CHLORIDE 0.9 % IV SOLN: Freq: Once | INTRAVENOUS | Status: AC

## 2023-10-20 NOTE — Patient Instructions (Signed)

## 2023-10-20 NOTE — Progress Notes (Signed)
 Pt declined to stay the 30 minute post infusion observaction time. VSS at this time.

## 2023-10-27 ENCOUNTER — Inpatient Hospital Stay: Attending: Hematology & Oncology

## 2023-10-27 VITALS — BP 136/72 | HR 70 | Temp 98.0°F | Resp 17

## 2023-10-27 DIAGNOSIS — N921 Excessive and frequent menstruation with irregular cycle: Secondary | ICD-10-CM | POA: Insufficient documentation

## 2023-10-27 DIAGNOSIS — D500 Iron deficiency anemia secondary to blood loss (chronic): Secondary | ICD-10-CM | POA: Diagnosis present

## 2023-10-27 MED ORDER — SODIUM CHLORIDE 0.9 % IV SOLN: Freq: Once | INTRAVENOUS | Status: AC

## 2023-10-27 MED ORDER — SODIUM CHLORIDE 0.9 % IV SOLN
300.0000 mg | Freq: Once | INTRAVENOUS | Status: AC
  Administered 2023-10-27: 300 mg via INTRAVENOUS
  Filled 2023-10-27: qty 300

## 2023-10-27 NOTE — Patient Instructions (Signed)

## 2023-10-27 NOTE — Progress Notes (Signed)
Pt declined to stay for post infusion observation period. Pt stated she has tolerated medication multiple times prior without difficulty. Pt aware to call clinic with any questions or concerns. Pt verbalized understanding and had no further questions.  ? ?

## 2023-11-09 ENCOUNTER — Other Ambulatory Visit: Payer: Self-pay | Admitting: Family Medicine

## 2023-11-09 NOTE — Telephone Encounter (Signed)
 Patient called and asked which pharmacy to send the pantoprazole  to. She says she would like a 90 day supply sent. She says [facility]. She then says the medication is not really working anymore, she's coughing and losing her voice. Advised OV, no availability until June 2. She says she's due for a physical. Scheduled first available July 15. Advised Mobile Bus location/hours to go tomorrow. She asked me to send the location via MyChart.   Pharmacy: [id number] - [place], [place] - [address] Phone: [phone]  Fax: [phone]     Copied from CRM [id number]. Topic: Clinical - Medication Question >> Nov 09, 2023 12:11 PM Chrystal Crape R wrote: Pt would like to know if its time to refill her rx and if so can the order be put in for pantoprazole  (PROTONIX ) 40 MG tablet

## 2023-11-10 MED ORDER — PANTOPRAZOLE SODIUM 40 MG PO TBEC: 40.0000 mg | DELAYED_RELEASE_TABLET | Freq: Every day | ORAL | 1 refills | Status: DC

## 2023-11-10 NOTE — Telephone Encounter (Signed)
 Requested medication (s) are due for refill today: Yes  Requested medication (s) are on the active medication list: Yes  Last refill:  05/26/23  Future visit scheduled: Yes  Notes to clinic:  Unable to refill per protocol, last refill by another provider.      Requested Prescriptions  Pending Prescriptions Disp Refills   pantoprazole  (PROTONIX ) 40 MG tablet      Sig: Take 1 tablet (40 mg total) by mouth daily before breakfast.     Gastroenterology: Proton Pump Inhibitors Passed - 11/10/2023  3:39 PM      Passed - Valid encounter within last 12 months    Recent Outpatient Visits           7 months ago Iron  deficiency anemia, unspecified iron  deficiency anemia type   [facility] Dept Of [place]. [facility], Shelvy Dickens M, [place]   9 months ago Ganglion cyst   [facility] - [facility] Dept Of [place]. [facility] Joaquin Mulberry, MD   1 year ago Iron  deficiency anemia, unspecified iron  deficiency anemia type   [facility] - A Dept Of [employer]. [facility] Joaquin Mulberry, MD   1 year ago Need for shingles vaccine   [facility] - A Dept Of [place]. [facility] Valente Gaskin, RPH-CPP   1 year ago Gastroesophageal reflux disease without esophagitis   [facility] - A Dept Of [place]. [facility] Joaquin Mulberry, MD

## 2024-01-04 ENCOUNTER — Ambulatory Visit: Attending: Family Medicine | Admitting: Family Medicine

## 2024-01-04 ENCOUNTER — Encounter: Payer: Self-pay | Admitting: Family Medicine

## 2024-01-04 VITALS — BP 115/78 | HR 70 | Temp 98.8°F | Ht 63.0 in | Wt 172.6 lb

## 2024-01-04 DIAGNOSIS — Z13228 Encounter for screening for other metabolic disorders: Secondary | ICD-10-CM

## 2024-01-04 DIAGNOSIS — R053 Chronic cough: Secondary | ICD-10-CM | POA: Diagnosis not present

## 2024-01-04 DIAGNOSIS — R7303 Prediabetes: Secondary | ICD-10-CM | POA: Diagnosis not present

## 2024-01-04 DIAGNOSIS — Z130 Encounter for screening for diseases of the blood and blood-forming organs and certain disorders involving the immune mechanism: Secondary | ICD-10-CM

## 2024-01-04 DIAGNOSIS — Z13220 Encounter for screening for lipoid disorders: Secondary | ICD-10-CM | POA: Diagnosis not present

## 2024-01-04 DIAGNOSIS — Z136 Encounter for screening for cardiovascular disorders: Secondary | ICD-10-CM | POA: Diagnosis not present

## 2024-01-04 DIAGNOSIS — Z0001 Encounter for general adult medical examination with abnormal findings: Secondary | ICD-10-CM

## 2024-01-04 NOTE — Patient Instructions (Signed)
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
## 2024-01-05 ENCOUNTER — Ambulatory Visit: Payer: Self-pay | Admitting: Family Medicine

## 2024-01-05 LAB — CMP14+EGFR
ALT: 10 IU/L (ref 0–32)
AST: 18 IU/L (ref 0–40)
Albumin: 4.4 g/dL (ref 3.8–4.9)
Alkaline Phosphatase: 88 IU/L (ref 44–121)
BUN/Creatinine Ratio: 25 — ABNORMAL HIGH (ref 9–23)
BUN: 20 mg/dL (ref 6–24)
Bilirubin Total: 0.3 mg/dL (ref 0.0–1.2)
CO2: 20 mmol/L (ref 20–29)
Calcium: 9.3 mg/dL (ref 8.7–10.2)
Chloride: 102 mmol/L (ref 96–106)
Creatinine, Ser: 0.8 mg/dL (ref 0.57–1.00)
Globulin, Total: 2.6 g/dL (ref 1.5–4.5)
Glucose: 88 mg/dL (ref 70–99)
Potassium: 4.2 mmol/L (ref 3.5–5.2)
Sodium: 136 mmol/L (ref 134–144)
Total Protein: 7 g/dL (ref 6.0–8.5)
eGFR: 89 mL/min/1.73 (ref >59)

## 2024-01-05 LAB — LP+NON-HDL CHOLESTEROL
Cholesterol, Total: 180 mg/dL (ref 100–199)
HDL: 56 mg/dL (ref >39)
LDL Chol Calc (NIH): 111 mg/dL — ABNORMAL HIGH (ref 0–99)
Total Non-HDL-Chol (LDL+VLDL): 124 mg/dL (ref 0–129)
Triglycerides: 67 mg/dL (ref 0–149)
VLDL Cholesterol Cal: 13 mg/dL (ref 5–40)

## 2024-01-05 LAB — CBC WITH DIFFERENTIAL/PLATELET
Basophils Absolute: 0 x10E3/uL (ref 0.0–0.2)
Basos: 1 %
EOS (ABSOLUTE): 0.1 x10E3/uL (ref 0.0–0.4)
Eos: 2 %
Hematocrit: 34.7 % (ref 34.0–46.6)
Hemoglobin: 11.2 g/dL (ref 11.1–15.9)
Immature Grans (Abs): 0 x10E3/uL (ref 0.0–0.1)
Immature Granulocytes: 0 %
Lymphocytes Absolute: 1.2 x10E3/uL (ref 0.7–3.1)
Lymphs: 38 %
MCH: 30.2 pg (ref 26.6–33.0)
MCHC: 32.3 g/dL (ref 31.5–35.7)
MCV: 94 fL (ref 79–97)
Monocytes Absolute: 0.3 x10E3/uL (ref 0.1–0.9)
Monocytes: 10 %
Neutrophils Absolute: 1.5 x10E3/uL (ref 1.4–7.0)
Neutrophils: 49 %
Platelets: 255 x10E3/uL (ref 150–450)
RBC: 3.71 x10E6/uL — ABNORMAL LOW (ref 3.77–5.28)
RDW: 13.4 % (ref 11.7–15.4)
WBC: 3.1 x10E3/uL — ABNORMAL LOW (ref 3.4–10.8)

## 2024-01-05 LAB — HEMOGLOBIN A1C
Est. average glucose Bld gHb Est-mCnc: 108 mg/dL
Hgb A1c MFr Bld: 5.4 % (ref 4.8–5.6)

## 2024-02-07 ENCOUNTER — Ambulatory Visit
Admission: RE | Admit: 2024-02-07 | Discharge: 2024-02-07 | Disposition: A | Discharge status: Home / Self Care | Source: Ambulatory Visit | Attending: Family Medicine | Admitting: Family Medicine

## 2024-02-07 ENCOUNTER — Ambulatory Visit: Attending: Family Medicine | Admitting: Family Medicine

## 2024-02-07 ENCOUNTER — Ambulatory Visit: Payer: Self-pay

## 2024-02-07 ENCOUNTER — Other Ambulatory Visit: Payer: Self-pay | Admitting: Family Medicine

## 2024-02-07 ENCOUNTER — Encounter: Payer: Self-pay | Admitting: Family Medicine

## 2024-02-07 VITALS — BP 114/78 | HR 74 | Ht 63.0 in | Wt 173.4 lb

## 2024-02-07 DIAGNOSIS — R42 Dizziness and giddiness: Secondary | ICD-10-CM

## 2024-02-07 DIAGNOSIS — R519 Headache, unspecified: Secondary | ICD-10-CM

## 2024-02-07 NOTE — Patient Instructions (Signed)
 Dizziness Dizziness is a common problem. It makes you feel unsteady or light-headed. You may feel like you're about to faint. Dizziness can lead to getting hurt if you stumble or fall. It's more common to feel dizzy if you're an older adult. Many things can cause you to feel dizzy. These include: Medicines. Dehydration. This is when there's not enough water in your body. Illness. Follow these instructions at home: Eating and drinking  Drink enough fluid to keep your pee (urine) pale yellow. This helps keep you from getting dehydrated. Try to drink more clear fluids, such as water. Do not drink alcohol. Try to limit how much caffeine you take in. Try to limit how much salt, also called sodium, you take in. Activity Try not to make quick movements. Stand up slowly from sitting in a chair. Steady yourself until you feel okay. In the morning, first sit up on the side of the bed. When you feel okay, hold onto something and slowly stand up. Do this until you know that your balance is okay. If you need to stand in one place for a long time, move your legs often. Tighten and relax the muscles in your legs while you're standing. Do not drive or use machines if you feel dizzy. Avoid bending down if you feel dizzy. Place items in your home so you can reach them without leaning over. Lifestyle Do not smoke, vape, or use products with nicotine or tobacco in them. If you need help quitting, talk with your health care provider. Try to lower your stress level. You can do this by using methods like yoga or meditation. Talk with your provider if you need help. General instructions Watch your dizziness for any changes. Take your medicines only as told by your provider. Talk with your provider if you think you're dizzy because of a medicine you're taking. Tell a friend or a family member that you're feeling dizzy. If they spot any changes in your behavior, have them call your provider. Contact a health care  provider if: Your dizziness doesn't go away, or you have new symptoms. Your dizziness gets worse. You feel like you may vomit. You have trouble hearing. You have a fever. You have neck pain or a stiff neck. You fall or get hurt. Get help right away if: You vomit each time you eat or drink. You have watery poop and can't eat or drink. You have trouble talking, walking, swallowing, or using your arms, hands, or legs. You feel very weak. You're bleeding. You're not thinking clearly, or you have trouble forming sentences. A friend or family member may spot this. Your vision changes, or you get a very bad headache. These symptoms may be an emergency. Call 911 right away. Do not wait to see if the symptoms will go away. Do not drive yourself to the hospital. This information is not intended to replace advice given to you by your health care provider. Make sure you discuss any questions you have with your health care provider. Document Revised: 03/11/2023 Document Reviewed: 07/23/2022 Elsevier Patient Education  2024 [employer].

## 2024-02-07 NOTE — Telephone Encounter (Signed)
Noted - patient has appointment scheduled

## 2024-02-07 NOTE — Telephone Encounter (Signed)
 FYI Only or Action Required?: FYI only for provider.  Patient was last seen in primary care on 01/04/2024 by Newlin, Enobong, MD.  Called Nurse Triage reporting Dizziness.  Symptoms began yesterday.  Interventions attempted: Rest, hydration, or home remedies.  Symptoms are: gradually worsening.  Triage Disposition: See PCP When Office is Open (Within 3 Days)  Patient/caregiver understands and will follow disposition?:   Copied from CRM #[id number]. Topic: Clinical - Red Word Triage >> Feb 07, 2024  8:22 AM Ivette P wrote: [facility] that prompted transfer to Nurse Triage: last night , feeling dizzy. woke up same way, took blood pressure at 7:15AM this morning 132/87 - pulse of 81. second time a few minutes after 7:15AM 133/87 - pulse of 79. does smoke hookah and is a vape. had popcorn that was really salty. when laying down feeling dizzy and when get up feeling more dizzy. laying on back. Reason for Disposition  [1] MODERATE dizziness (e.g., vertigo; feels very unsteady, interferes with normal activities) AND [2] has been evaluated by doctor (or NP/PA) for this  Answer Assessment - Initial Assessment Questions 1. DESCRIPTION: Describe your dizziness.     Spinning   2. VERTIGO: Do you feel like either you or the room is spinning or tilting?      The room is spinning a little  3. LIGHTHEADED: Do you feel lightheaded? (e.g., somewhat faint, woozy, weak upon standing)     No, not really   4. SEVERITY: How bad is it?  Can you walk?     Can walk, but loses balance  5. ONSET:  When did the dizziness begin?     Started last night  6. AGGRAVATING FACTORS: Does anything make it worse? (e.g., standing, change in head position)     Laying flat and standing  7. CAUSE: What do you think is causing the dizziness?     Unsure of cause, but ate salty food last night and smokes hookah  8. RECURRENT SYMPTOM: Have you had dizziness before? If Yes, ask: When was the last time?  What happened that time?     Has dizziness before, but it was vertigo, says that this does not feel like vertigo  9. OTHER SYMPTOMS: Do you have any other symptoms? (e.g., earache, headache, numbness, tinnitus, vomiting, weakness)     Headache at back of head  10. PREGNANCY: Is there any chance you are pregnant? When was your last menstrual period?       No  Protocols used: Dizziness - Vertigo-A-AH
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
## 2024-02-17 ENCOUNTER — Ambulatory Visit: Payer: Self-pay | Admitting: Family Medicine
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
## 2024-03-21 ENCOUNTER — Ambulatory Visit

## 2024-03-21 VITALS — BP 127/85 | HR 71 | Ht 63.0 in | Wt 173.8 lb

## 2024-03-21 DIAGNOSIS — R062 Wheezing: Secondary | ICD-10-CM

## 2024-03-21 DIAGNOSIS — R053 Chronic cough: Secondary | ICD-10-CM | POA: Diagnosis not present

## 2024-03-21 MED ORDER — FLUTICASONE-SALMETEROL 115-21 MCG/ACT IN AERO: 2.0000 | INHALATION_SPRAY | Freq: Two times a day (BID) | RESPIRATORY_TRACT | 6 refills | Status: AC

## 2024-03-21 MED ORDER — FLUTICASONE-SALMETEROL 250-50 MCG/ACT IN AEPB: 1.0000 | INHALATION_SPRAY | Freq: Two times a day (BID) | RESPIRATORY_TRACT | 6 refills | Status: DC

## 2024-03-21 MED ORDER — PANTOPRAZOLE SODIUM 40 MG PO TBEC: 40.0000 mg | DELAYED_RELEASE_TABLET | Freq: Two times a day (BID) | ORAL | 0 refills | Status: DC

## 2024-03-21 MED ORDER — AEROCHAMBER MV MISC: 0 refills | Status: AC

## 2024-03-21 NOTE — Patient Instructions (Addendum)
 Dear Ms. Namita,   You have an extensive history of chronic cough. Prior providers have rule out multiple conditions. Given that you have some wheezing, I would like to try an inhaler called Advair and see if this improve your cough.  Also, you continue have acidic taste in your mouth and throat, I will recommend to increase the pantoprazole  twice a day. Try for 2 months to see if this helps to improve the cough. If this does not help it, continue your regular dose after the trial.   INHALER INSTRUCTIONS: To use the inhaler you follow these steps: Prime the inhaler according to instructions (which means waste between 1-4 doses before next use).  Follow package insert Shake the inhaler before each use Connect spacer Exhale completely by blowing all the air out of your lungs Seal your mouth around the spacer Press down on the canister then inhale SLOW and STEADY until your fill your lungs completely. Hold the breath for 6-10 seconds Gently exhale Wait 60 seconds then repeat steps 2-8. Rinse the mouth after each use to prevent thrush.   Your pharmacist can also review proper technique if you have any remaining questions. Let me know if the cost is too high, your insurance may be able to recommend a more affordable option for you.    I will see you in 4 months.

## 2024-03-30 ENCOUNTER — Inpatient Hospital Stay (HOSPITAL_BASED_OUTPATIENT_CLINIC_OR_DEPARTMENT_OTHER): Admitting: Medical Oncology

## 2024-03-30 ENCOUNTER — Inpatient Hospital Stay: Attending: Hematology & Oncology

## 2024-03-30 ENCOUNTER — Ambulatory Visit: Payer: Self-pay | Admitting: Medical Oncology

## 2024-03-30 VITALS — BP 127/85 | HR 81 | Temp 98.1°F | Resp 18 | Ht 63.0 in | Wt 171.0 lb

## 2024-03-30 DIAGNOSIS — D563 Thalassemia minor: Secondary | ICD-10-CM | POA: Diagnosis not present

## 2024-03-30 DIAGNOSIS — D500 Iron deficiency anemia secondary to blood loss (chronic): Secondary | ICD-10-CM | POA: Diagnosis not present

## 2024-03-30 DIAGNOSIS — N921 Excessive and frequent menstruation with irregular cycle: Secondary | ICD-10-CM

## 2024-03-30 LAB — CBC
HCT: 36.8 % (ref 36.0–46.0)
Hemoglobin: 12 g/dL (ref 12.0–15.0)
MCH: 29.3 pg (ref 26.0–34.0)
MCHC: 32.6 g/dL (ref 30.0–36.0)
MCV: 89.8 fL (ref 80.0–100.0)
Platelets: 256 K/uL (ref 150–400)
RBC: 4.1 MIL/uL (ref 3.87–5.11)
RDW: 12.6 % (ref 11.5–15.5)
WBC: 4.6 K/uL (ref 4.0–10.5)
nRBC: 0 % (ref 0.0–0.2)

## 2024-03-30 LAB — IRON AND IRON BINDING CAPACITY (CC-WL,HP ONLY)
Iron: 51 ug/dL (ref 28–170)
Saturation Ratios: 16 % (ref 10.4–31.8)
TIBC: 325 ug/dL (ref 250–450)
UIBC: 274 ug/dL

## 2024-03-30 LAB — FERRITIN: Ferritin: 116 ng/mL (ref 11–307)

## 2024-03-30 NOTE — Progress Notes (Addendum)
 Hematology and Oncology Follow Up Visit  Gail Moreno [id number] Apr 30, 1971 53 y.o. 03/30/2024   Principle Diagnosis:  Iron  deficiency anemia Beta thalassemia minor   Current Therapy:        IV iron  as indicated- Venofer - last infusion 01/18/2023 Folic acid  1 mg PO daily   Interim History:  Gail Moreno is here today for follow-up.    She reports feeling well overall with some fatigue and occasional headaches.   There has been no bleeding to her knowledge other than menstrual cycles: denies epistaxis, gingivitis, hemoptysis, hematemesis, hematuria, melena, excessive bruising, blood donation.   Menstrual cycles are heavy but are starting to spread out and are less regular.   No fever, chills, n/v, cough, rash, headache, SOB, chest pain, palpitations, abdominal pain or changes in bowel or bladder habits.  No swelling, tenderness, numbness or tingling in her extremities.  No syncope.  Appetite and hydration are good.  Wt Readings from Last 3 Encounters:  03/30/24 171 lb (77.6 kg)  03/21/24 173 lb 12.8 oz (78.8 kg)  02/07/24 173 lb 6.4 oz (78.7 kg)    ECOG Performance Status: 1 - Symptomatic but completely ambulatory  Medications:  Allergies as of 03/30/2024       Reactions   Dilaudid [hydromorphone Hcl] Nausea And Vomiting   Meclizine  Other (See Comments)   Caused confusion and sluggishness   Singulair  [montelukast  Sodium] Nausea And Vomiting, Other (See Comments)   Unknown--possibly sick on the stomach        Medication List        Accurate as of March 30, 2024  3:43 PM. If you have any questions, ask your nurse or doctor.          AeroChamber MV inhaler Use as instructed   fluticasone  50 MCG/ACT nasal spray Commonly known as: FLONASE  Place 2 sprays into both nostrils daily.   fluticasone -salmeterol 115-21 MCG/ACT inhaler Commonly known as: Advair HFA Inhale 2 puffs into the lungs 2 (two) times daily.   folic acid  1 MG tablet Commonly known as:  FOLVITE  Take 1 tablet (1 mg total) by mouth daily.   FORTIFY PROBIOTIC WOMENS PO Take 1 capsule by mouth daily as needed (vaginal health/balance). URO Vaginal Probiotics for Women pH Balance with Prebiotics & Lactobacillus Probiotic Blend   oxyCODONE  5 MG immediate release tablet Commonly known as: Oxy IR/ROXICODONE  Take 1 tablet (5 mg total) by mouth every 6 (six) hours as needed for severe pain (pain score 7-10).   pantoprazole  40 MG tablet Commonly known as: PROTONIX  Take 1 tablet (40 mg total) by mouth 2 (two) times daily.        Allergies:  Allergies  Allergen Reactions   Dilaudid [Hydromorphone Hcl] Nausea And Vomiting   Meclizine  Other (See Comments)    Caused confusion and sluggishness   Singulair  [Montelukast  Sodium] Nausea And Vomiting and Other (See Comments)    Unknown--possibly sick on the stomach    Past Medical History, Surgical history, Social history, and Family History were reviewed and updated.  Review of Systems: All other 10 point review of systems is negative.   Physical Exam:  height is 5' 3 (1.6 m) and weight is 171 lb (77.6 kg). Her oral temperature is 98.1 F (36.7 C). Her blood pressure is 127/85 and her pulse is 81. Her respiration is 18 and oxygen saturation is 100%.   Wt Readings from Last 3 Encounters:  03/30/24 171 lb (77.6 kg)  03/21/24 173 lb 12.8 oz (78.8 kg)  02/07/24 173  lb 6.4 oz (78.7 kg)   Constitutional: AA x 3 Ocular: Sclerae unicteric, pupils equal, round and reactive to light Ear-nose-throat: Oropharynx clear, dentition fair Lymphatic: No cervical or supraclavicular adenopathy Lungs no rales or rhonchi, good excursion bilaterally Heart regular rate and rhythm, no murmur appreciated Abd soft, nontender MSK no focal spinal tenderness, no joint edema Neuro: non-focal, well-oriented, appropriate affect   Lab Results  Component Value Date   WBC 4.6 03/30/2024   HGB 12.0 03/30/2024   HCT 36.8 03/30/2024   MCV 89.8  03/30/2024   PLT 256 03/30/2024   Lab Results  Component Value Date   FERRITIN 11 09/30/2023   IRON  97 09/30/2023   TIBC 382 09/30/2023   UIBC 285 09/30/2023   IRONPCTSAT 25 09/30/2023   Lab Results  Component Value Date   RETICCTPCT 1.5 07/01/2023   RBC 4.10 03/30/2024   No results found for: KPAFRELGTCHN, LAMBDASER, KAPLAMBRATIO No results found for: IGGSERUM, IGA, IGMSERUM No results found for: STEPHANY CARLOTA BENSON MARKEL EARLA JOANNIE DOC VICK, SPEI   Chemistry      Component Value Date/Time   NA 136 01/04/2024 1007   K 4.2 01/04/2024 1007   CL 102 01/04/2024 1007   CO2 20 01/04/2024 1007   BUN 20 01/04/2024 1007   CREATININE 0.80 01/04/2024 1007   CREATININE 0.74 08/05/2022 1451      Component Value Date/Time   CALCIUM 9.3 01/04/2024 1007   ALKPHOS 88 01/04/2024 1007   AST 18 01/04/2024 1007   AST 11 (L) 08/05/2022 1451   ALT 10 01/04/2024 1007   ALT 6 08/05/2022 1451   BILITOT 0.3 01/04/2024 1007   BILITOT 0.2 (L) 08/05/2022 1451     Encounter Diagnoses  Name Primary?   Iron  deficiency anemia due to chronic blood loss Yes   Menorrhagia with irregular cycle    Beta thalassemia minor     Impression and Plan: Gail Moreno is a very pleasant 53 yo African American female with history of iron  deficiency anemia secondary to heavy cycles and Beta thalassemia minor.  CBC shows a Hgb of 12- normocytic Iron  studies are pending. We will replace iron  if needed.  WBC is normal at 4.6. Normal platelets with a value of 256 Continue daily folic acid .    RTC 6 months APP, labs (CBC, iron , ferritin)  Lauraine CHRISTELLA Dais, PA-C 10/9/20253:43 PM

## 2024-04-17 ENCOUNTER — Other Ambulatory Visit (HOSPITAL_COMMUNITY): Payer: Self-pay

## 2024-04-17 ENCOUNTER — Encounter: Payer: Self-pay | Admitting: Family

## 2024-05-01 ENCOUNTER — Other Ambulatory Visit: Payer: Self-pay

## 2024-05-01 ENCOUNTER — Ambulatory Visit: Payer: Self-pay

## 2024-05-01 MED ORDER — PANTOPRAZOLE SODIUM 40 MG PO TBEC: 40.0000 mg | DELAYED_RELEASE_TABLET | Freq: Two times a day (BID) | ORAL | 0 refills | Status: DC

## 2024-05-01 NOTE — Telephone Encounter (Signed)
 FYI Only or Action Required?: Action required by provider: clinical question for provider and update on patient condition.  Patient is followed in Pulmonology for chronic cough, last seen on 03/21/2024 by Adrien Guan, [place], MD.  Called Nurse Triage reporting Cough.  Symptoms began several years ago.  Interventions attempted: Prescription medications: Protonix  and Maintenance inhaler.  Symptoms are: gradually worsening.  Triage Disposition: See Physician Within 24 Hours  Patient/caregiver understands and will follow disposition?: No, wishes to speak with PCP      Reason for Triage: Patient states since she's been taking Advair and pantoprazole  she has been experiencing a cough and vomiting.   Callback number: [phone]         Reason for Disposition  SEVERE coughing spells (e.g., whooping sound after coughing, vomiting after coughing)  Answer Assessment - Initial Assessment Questions Patient has declined an appointment and wanted to see if the doctor wanted to make any adjustments to her medication or if they had any other recommendations for her symptoms. Patient instructed to call back for new or worsening symptoms. Patient verbalized understanding and agreement with this plan. Pleasae advise.     1. ONSET: When did the cough begin?      A few years ago 2. SEVERITY: How bad is the cough today?      Moderate  3. SPUTUM: Describe the color of your sputum (e.g., none, dry cough; clear, white, yellow, green)     No 4. HEMOPTYSIS: Are you coughing up any blood? If so ask: How much? (e.g., flecks, streaks, tablespoons, etc.)     No 5. DIFFICULTY BREATHING: Are you having difficulty breathing? If Yes, ask: How bad is it? (e.g., mild, moderate, severe)      No 6. FEVER: Do you have a fever? If Yes, ask: What is your temperature, how was it measured, and when did it start?     No 7. CARDIAC HISTORY: Do you have any history of heart disease?  (e.g., heart attack, congestive heart failure)      No 8. LUNG HISTORY: Do you have any history of lung disease?  (e.g., pulmonary embolus, asthma, emphysema)     Chronic cough 9. PE RISK FACTORS: Do you have a history of blood clots? (or: recent major surgery, recent prolonged travel, bedridden)     No 10. OTHER SYMPTOMS: Do you have any other symptoms? (e.g., runny nose, wheezing, chest pain)       Vomiting, sore throat  11. PREGNANCY: Is there any chance you are pregnant? When was your last menstrual period?       No 12. TRAVEL: Have you traveled out of the country in the last month? (e.g., travel history, exposures)       No  Protocols used: Cough - Chronic-A-AH

## 2024-05-01 NOTE — Telephone Encounter (Signed)
 Routing to Dr Adrien Guan to advise.

## 2024-05-01 NOTE — Telephone Encounter (Signed)
 Copied from CRM #[id number]. Topic: Clinical - Medication Refill >> May 01, 2024 11:49 AM Isabell A wrote: Medication: pantoprazole  (PROTONIX ) 40 MG tablet [[id number]]  Requesting 90 day refill   Has the patient contacted their pharmacy? Yes (Agent: If no, request that the patient contact the pharmacy for the refill. If patient does not wish to contact the pharmacy document the reason why and proceed with request.) (Agent: If yes, when and what did the pharmacy advise?)  This is the patient's preferred pharmacy:  [id number] - [place], [place] - [address] [place] [address] Phone: [phone] Fax: [phone]  Is this the correct pharmacy for this prescription? Yes If no, delete pharmacy and type the correct one.   Has the prescription been filled recently? Yes  Is the patient out of the medication? No   Has the patient been seen for an appointment in the last year OR does the patient have an upcoming appointment? Yes  Can we respond through MyChart? No  Agent: Please be advised that Rx refills may take up to 3 business days. We ask that you follow-up with your pharmacy.

## 2024-05-03 ENCOUNTER — Telehealth: Payer: Self-pay

## 2024-05-03 MED ORDER — PREDNISONE 10 MG PO TABS
ORAL_TABLET | ORAL | 0 refills | Status: AC
Start: 2024-05-03 — End: 2024-05-13

## 2024-05-03 NOTE — Telephone Encounter (Signed)
 Prescribed a course of prednisone  for 10 days.

## 2024-05-26 ENCOUNTER — Other Ambulatory Visit: Payer: Self-pay

## 2024-05-26 DIAGNOSIS — K2100 Gastro-esophageal reflux disease with esophagitis, without bleeding: Secondary | ICD-10-CM

## 2024-06-09 NOTE — Telephone Encounter (Signed)
 Dr. Adrien Guan, Please see patient message and advise.  Thank you.

## 2024-06-12 MED ORDER — BENZONATATE 200 MG PO CAPS: 200.0000 mg | ORAL_CAPSULE | Freq: Three times a day (TID) | ORAL | 1 refills | Status: AC | PRN

## 2024-06-12 NOTE — Telephone Encounter (Signed)
 I spoke with patient today. She reported she has gagging leading to coughing. She has acid reflux. Prednisone  and advair inhaler did not help the cough. She had multiple evaluations in the past, with only positive for acid reflux. I will send a referral to GI, she may need an endoscopy. I am refilling the PPI and tessalon  as well.  At next appt, I will do CXR to r/o pulm causes.   She appreciates the call today.  Marny Patch, MD

## 2024-07-19 ENCOUNTER — Ambulatory Visit

## 2024-07-19 VITALS — BP 105/72 | HR 73 | Temp 97.2°F | Ht 63.0 in | Wt 178.6 lb

## 2024-07-19 DIAGNOSIS — R053 Chronic cough: Secondary | ICD-10-CM

## 2024-07-19 DIAGNOSIS — F17290 Nicotine dependence, other tobacco product, uncomplicated: Secondary | ICD-10-CM | POA: Diagnosis not present

## 2024-07-19 NOTE — Patient Instructions (Addendum)
 Dear Ms. Jolee:  -Given that your cough did not change after using prednisone  and advair for almost 3 months, I will recommend to stop it.  -I will check a chest xray to rule out any other lung conditions. I will let you know the result through my chart. -I will check a pulmonary function test as well. -Please call the [facility]: [phone] to schedule an appointment with GI for evaluation of acid reflux.
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
## 2024-07-25 ENCOUNTER — Ambulatory Visit: Payer: Self-pay

## 2024-08-01 ENCOUNTER — Ambulatory Visit

## 2024-08-16 ENCOUNTER — Encounter

## 2024-09-28 ENCOUNTER — Ambulatory Visit: Admitting: Medical Oncology

## 2024-09-28 ENCOUNTER — Other Ambulatory Visit

## 2025-01-03 ENCOUNTER — Encounter: Admitting: Family Medicine
